# Patient Record
Sex: Male | Born: 1940
Health system: Southern US, Community
[De-identification: ages and names within clinical notes are randomized; demographics above are authoritative.]

## PROBLEM LIST (undated history)

## (undated) DIAGNOSIS — H409 Unspecified glaucoma: Secondary | ICD-10-CM

## (undated) DIAGNOSIS — E785 Hyperlipidemia, unspecified: Secondary | ICD-10-CM

## (undated) DIAGNOSIS — I7090 Unspecified atherosclerosis: Secondary | ICD-10-CM

## (undated) DIAGNOSIS — E119 Type 2 diabetes mellitus without complications: Secondary | ICD-10-CM

## (undated) DIAGNOSIS — I252 Old myocardial infarction: Secondary | ICD-10-CM

## (undated) DIAGNOSIS — I2510 Atherosclerotic heart disease of native coronary artery without angina pectoris: Secondary | ICD-10-CM

## (undated) DIAGNOSIS — I10 Essential (primary) hypertension: Secondary | ICD-10-CM

## (undated) DIAGNOSIS — I739 Peripheral vascular disease, unspecified: Secondary | ICD-10-CM

## (undated) HISTORY — DX: Essential (primary) hypertension: I10

## (undated) HISTORY — DX: Type 2 diabetes mellitus without complications: E11.9

## (undated) HISTORY — DX: Peripheral vascular disease, unspecified: I73.9

## (undated) HISTORY — DX: Hyperlipidemia, unspecified: E78.5

## (undated) HISTORY — DX: Unspecified atherosclerosis: I70.90

## (undated) HISTORY — DX: Atherosclerotic heart disease of native coronary artery without angina pectoris: I25.10

---

## 2003-01-30 ENCOUNTER — Inpatient Hospital Stay (HOSPITAL_COMMUNITY): Admission: EM | Admit: 2003-01-30 | Discharge: 2003-02-08 | Payer: Self-pay | Admitting: Emergency Medicine

## 2003-02-22 HISTORY — PX: CORONARY ARTERY BYPASS GRAFT: SHX141

## 2004-04-01 ENCOUNTER — Ambulatory Visit: Payer: Self-pay | Admitting: Family Medicine

## 2004-06-21 ENCOUNTER — Ambulatory Visit: Payer: Self-pay | Admitting: Family Medicine

## 2005-01-07 ENCOUNTER — Ambulatory Visit: Payer: Self-pay | Admitting: Family Medicine

## 2005-06-14 ENCOUNTER — Ambulatory Visit: Payer: Self-pay | Admitting: Family Medicine

## 2005-11-14 ENCOUNTER — Ambulatory Visit: Payer: Self-pay | Admitting: Family Medicine

## 2006-02-27 ENCOUNTER — Ambulatory Visit: Payer: Self-pay | Admitting: Family Medicine

## 2010-01-18 ENCOUNTER — Encounter: Payer: Self-pay | Admitting: Endocrinology

## 2010-01-18 LAB — CONVERTED CEMR LAB: Hgb A1c MFr Bld: 11.9 %

## 2010-02-10 ENCOUNTER — Encounter: Payer: Self-pay | Admitting: Endocrinology

## 2010-02-10 DIAGNOSIS — E785 Hyperlipidemia, unspecified: Secondary | ICD-10-CM | POA: Insufficient documentation

## 2010-02-10 DIAGNOSIS — E1169 Type 2 diabetes mellitus with other specified complication: Secondary | ICD-10-CM | POA: Insufficient documentation

## 2010-02-10 DIAGNOSIS — I7090 Unspecified atherosclerosis: Secondary | ICD-10-CM | POA: Insufficient documentation

## 2010-02-10 DIAGNOSIS — E119 Type 2 diabetes mellitus without complications: Secondary | ICD-10-CM

## 2010-02-10 DIAGNOSIS — I739 Peripheral vascular disease, unspecified: Secondary | ICD-10-CM

## 2010-02-10 DIAGNOSIS — I10 Essential (primary) hypertension: Secondary | ICD-10-CM

## 2010-02-10 DIAGNOSIS — I152 Hypertension secondary to endocrine disorders: Secondary | ICD-10-CM | POA: Insufficient documentation

## 2010-02-10 HISTORY — DX: Unspecified atherosclerosis: I70.90

## 2010-02-10 HISTORY — DX: Hyperlipidemia, unspecified: E78.5

## 2010-02-10 HISTORY — DX: Type 2 diabetes mellitus without complications: E11.9

## 2010-02-10 HISTORY — DX: Peripheral vascular disease, unspecified: I73.9

## 2010-02-10 HISTORY — DX: Essential (primary) hypertension: I10

## 2010-02-25 ENCOUNTER — Ambulatory Visit
Admission: RE | Admit: 2010-02-25 | Discharge: 2010-02-25 | Payer: Self-pay | Source: Home / Self Care | Attending: Endocrinology | Admitting: Endocrinology

## 2010-02-25 DIAGNOSIS — I2510 Atherosclerotic heart disease of native coronary artery without angina pectoris: Secondary | ICD-10-CM | POA: Insufficient documentation

## 2010-02-25 HISTORY — DX: Atherosclerotic heart disease of native coronary artery without angina pectoris: I25.10

## 2010-03-11 ENCOUNTER — Ambulatory Visit
Admission: RE | Admit: 2010-03-11 | Discharge: 2010-03-11 | Payer: Self-pay | Source: Home / Self Care | Attending: Endocrinology | Admitting: Endocrinology

## 2010-03-11 LAB — CONVERTED CEMR LAB: Blood Glucose, Fingerstick: 200
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
## 2010-03-25 NOTE — Assessment & Plan Note (Signed)
Summary: 2 WK FU---STC   Vital Signs:  Patient profile:   70 year old male Height:      70 inches (177.80 cm) Weight:      181 pounds (82.27 kg) BMI:     26.06 O2 Sat:      97 % on Room air Temp:     97.5 degrees F (36.39 degrees C) oral Pulse rate:   57 / minute Pulse rhythm:   regular BP sitting:   134 / 82  (left arm) Cuff size:   regular  Vitals Entered By: Brenton Grills CMA Duncan Dull) (March 11, 2010 9:14 AM)  O2 Flow:  Room air CC: 2 week F/U/aj Is Patient Diabetic? Yes CBG Result 200   Referring Provider:  Ernestine Conrad MD Primary Provider:  Ernestine Conrad MD  CC:  2 week F/U/aj.  History of Present Illness: pt states he feels well in general.  he brings a record of his cbg's which i have reviewed today.  it was 79 this am.  he is not sure why it was lower today.  otherwise, it varies from 180-210, with no trend throughout the day.    Current Medications (verified): 1)  Lisinopril 20 Mg Tabs (Lisinopril) .Marland Kitchen.. 1 Tablet By Mouth Once Daily 2)  Metoprolol Tartrate 25 Mg Tabs (Metoprolol Tartrate) .Marland Kitchen.. 1 Tablet By Mouth Every Morning 3)  Ambien 10 Mg Tabs (Zolpidem Tartrate) .Marland Kitchen.. 1 Tablet By Mouth At Bedtime 4)  Amlodipine Besylate 5 Mg Tabs (Amlodipine Besylate) .Marland Kitchen.. 1 Tablet By Mouth Once Daily 5)  Humulin N 100 Unit/ml Susp (Insulin Isophane Human) .... 40 Units Each Am, and Syringes Once Daily  Allergies (verified): No Known Drug Allergies  Past History:  Past Medical History: Last updated: 02/10/2010 Hypertension Hyperlipidemia NOS Diabetes Type 2  Review of Systems  The patient denies hypoglycemia.    Physical Exam  General:  normal appearance.   Skin:  injection sites at the anteriod abdomen are without lesions.     Impression & Recommendations:  Problem # 1:  DIABETES TYPE 2 (ICD-250.00) needs increased rx  Medications Added to Medication List This Visit: 1)  Humulin N 100 Unit/ml Susp (Insulin isophane human) .... 45 units each am, and syringes  once daily  Other Orders: Glucose, (CBG) (16109) Est. Patient Level III ([address])  Patient Instructions: 1)  check your blood sugar 2 times a day.  vary the time of day when you check, between before the 3 meals, and at bedtime.  also check if you have symptoms of your blood sugar being too high or too low.  please keep a record of the readings and bring it to your next appointment here.  please call us sooner if you are having low blood sugar episodes. 2)  for now, increase humulin nph insulin to 45 units each am. 3)  Please schedule a follow-up appointment in 1 month. Prescriptions: HUMULIN N 100 UNIT/ML SUSP (INSULIN ISOPHANE HUMAN) 45 units each am, and syringes once daily  #2 vials x 11   Entered and Authorized by:   Minus Breeding MD   Signed by:   Minus Breeding MD on 03/11/2010   Method used:   Electronically to        [facility]  [address] (retail)       [address]       [place], [place]  [address]       Ph: [phone]  Fax: [phone]   RxID:   [id number]    Orders Added: 1)  Glucose, (CBG) [82962] 2)  Est. Patient Level III [53664]    Laboratory Results   Blood Tests     CBG Random:: 200mg /dL

## 2010-03-25 NOTE — Letter (Signed)
Summary: [facility]   Imported By: Sherian Rein 03/04/2010 12:55:22  _____________________________________________________________________  External Attachment:    Type:   Image     Comment:   External Document

## 2010-04-15 ENCOUNTER — Ambulatory Visit: Payer: Self-pay | Admitting: Endocrinology

## 2010-04-19 ENCOUNTER — Other Ambulatory Visit: Payer: Medicare Other

## 2010-04-19 ENCOUNTER — Other Ambulatory Visit: Payer: Self-pay | Admitting: Endocrinology

## 2010-04-19 ENCOUNTER — Ambulatory Visit (INDEPENDENT_AMBULATORY_CARE_PROVIDER_SITE_OTHER): Payer: Medicare Other | Admitting: Endocrinology

## 2010-04-19 ENCOUNTER — Encounter: Payer: Self-pay | Admitting: Endocrinology

## 2010-04-19 DIAGNOSIS — E119 Type 2 diabetes mellitus without complications: Secondary | ICD-10-CM

## 2010-04-19 LAB — HEMOGLOBIN A1C: Hgb A1c MFr Bld: 11.4 % — ABNORMAL HIGH (ref 4.6–6.5)

## 2010-04-19 LAB — CONVERTED CEMR LAB: Blood Glucose, Fingerstick: 286

## 2010-04-29 NOTE — Assessment & Plan Note (Signed)
Summary: 1 mth fu stc   Vital Signs:  Patient profile:   70 year old male Height:      70 inches Weight:      184.25 pounds BMI:     26.53 O2 Sat:      97 % on Room air Temp:     98.4 degrees F oral Pulse rate:   58 / minute BP sitting:   140 / 82  (left arm) Cuff size:   regular  Vitals Entered By: Margaret Pyle, CMA (April 19, 2010 2:16 PM)  O2 Flow:  Room air CC: 1 mth follow up/DBD Is Patient Diabetic? Yes CBG Result 286   Referring Provider:  Ernestine Conrad MD Primary Provider:  Ernestine Conrad MD  CC:  1 mth follow up/DBD.  History of Present Illness: he does not feel well, due to several dental extractions last week.  no cbg record, but states cbg's vary from 179-400, with no trend throughout the day.    Allergies: No Known Drug Allergies  Past History:  Past Medical History: Last updated: 02/10/2010 Hypertension Hyperlipidemia NOS Diabetes Type 2  Review of Systems  The patient denies hypoglycemia.    Physical Exam  General:  normal appearance.   Psych:  Alert and cooperative; normal mood and affect; normal attention span and concentration.   Additional Exam:  Hemoglobin A1C       [H]  11.4 %    Impression & Recommendations:  Problem # 1:  DIABETES TYPE 2 (ICD-250.00) therapy is severely limited by noncompliance, and by cost factors.  i'll do the best i can.  Medications Added to Medication List This Visit: 1)  Humulin N 100 Unit/ml Susp (Insulin isophane human) .... 50 units each am, and syringes once daily  Other Orders: TLB-A1C / Hgb A1C (Glycohemoglobin) (83036-A1C) Est. Patient Level III (95621)  Patient Instructions: 1)  check your blood sugar 2 times a day.  vary the time of day when you check, between before the 3 meals, and at bedtime.  also check if you have symptoms of your blood sugar being too high or too low.  please keep a record of the readings and bring it to your next appointment here.  please call us sooner if you are  having low blood sugar episodes. 2)  blood tests are being ordered for you today.  please call [phone] to hear your test results. 3)  pending the test results, please increase humulin nph insulin to 50 units each am. 4)  Please schedule a follow-up appointment in 1 month. 5)  (update: i left message on phone-tree:  increase insulin to 60 units each am)   Orders Added: 1)  TLB-A1C / Hgb A1C (Glycohemoglobin) [83036-A1C] 2)  Est. Patient Level III [46962]

## 2010-05-19 ENCOUNTER — Ambulatory Visit: Payer: Medicare Other | Admitting: Endocrinology

## 2010-05-19 ENCOUNTER — Encounter: Payer: Self-pay | Admitting: Endocrinology

## 2010-06-08 ENCOUNTER — Ambulatory Visit (INDEPENDENT_AMBULATORY_CARE_PROVIDER_SITE_OTHER): Payer: Medicare Other | Admitting: Endocrinology

## 2010-06-08 ENCOUNTER — Encounter: Payer: Self-pay | Admitting: Endocrinology

## 2010-06-08 VITALS — BP 132/70 | HR 56 | Temp 98.0°F | Ht 70.0 in | Wt 190.8 lb

## 2010-06-08 DIAGNOSIS — E119 Type 2 diabetes mellitus without complications: Secondary | ICD-10-CM

## 2010-06-08 MED ORDER — INSULIN NPH (HUMAN) (ISOPHANE) 100 UNIT/ML ~~LOC~~ SUSP: 70.0000 [IU] | SUBCUTANEOUS | Status: DC

## 2010-06-08 NOTE — Patient Instructions (Addendum)
increase nph insulin to 70 units each morning. Please make a follow-up appointment in 2 weeks. check your blood sugar 2 times a day.  vary the time of day when you check, between before the 3 meals, and at bedtime.  also check if you have symptoms of your blood sugar being too high or too low.  please keep a record of the readings and bring it to your next appointment here.  please call us sooner if you are having low blood sugar episodes.

## 2010-06-08 NOTE — Progress Notes (Signed)
  Subjective:    Patient ID: Steven Osborne, male    DOB: 1940-03-08, 70 y.o.   MRN: [id number]  HPI pt states he feels well in general, except for weight gain.  He has  Not yet increased his insulin to 60 unit qam, and he is using-up his 50-unit syringes.  he brings a record of his cbg's which i have reviewed today.  It varies form 120-300.  It is in general higher as the day goes on.   Past Medical History  Diagnosis Date  . DIABETES TYPE 2 02/10/2010  . HYPERLIPIDEMIA NOS 02/10/2010  . HYPERTENSION 02/10/2010  . CAD 02/25/2010  . ARTERIOSCLEROSIS 02/10/2010  . INTERMITTENT CLAUDICATION 02/10/2010   Past Surgical History  Procedure Date  . Coronary artery bypass graft 2005    4 vessel bypass     reports that he has quit smoking. His smokeless tobacco use includes Chew. He reports that he does not drink alcohol or use illicit drugs. family history includes Diabetes in his brother, father, mother, and sister; Heart disease in his father and mother; Hypertension in his other; and Stroke in his father. No Known Allergies   Review of Systems denies hypoglycemia    Objective:   Physical Exam GENERAL: no distress Pulses: dorsalis pedis intact bilat.   Feet: no deformity.  no ulcer on the feet.  feet are of normal color and temp.  no edema Neuro: sensation is intact to touch on the feet.        Assessment & Plan:  Dm, needs increased rx

## 2010-06-22 ENCOUNTER — Encounter: Payer: Self-pay | Admitting: Endocrinology

## 2010-06-22 ENCOUNTER — Ambulatory Visit (INDEPENDENT_AMBULATORY_CARE_PROVIDER_SITE_OTHER): Payer: Medicare Other | Admitting: Endocrinology

## 2010-06-22 DIAGNOSIS — E119 Type 2 diabetes mellitus without complications: Secondary | ICD-10-CM

## 2010-06-22 MED ORDER — INSULIN NPH (HUMAN) (ISOPHANE) 100 UNIT/ML ~~LOC~~ SUSP: SUBCUTANEOUS | Status: DC

## 2010-06-22 NOTE — Progress Notes (Signed)
  Subjective:    Patient ID: Steven Osborne, male    DOB: 1940/12/19, 70 y.o.   MRN: [id number]  HPI pt states he feels well in general.  he brings a record of his cbg's which i have reviewed today.  It vries from 120-300.  It is higher in am than in the afternoon.  He reports weight gain.   Past Medical History  Diagnosis Date  . DIABETES TYPE 2 02/10/2010  . HYPERLIPIDEMIA NOS 02/10/2010  . HYPERTENSION 02/10/2010  . CAD 02/25/2010  . ARTERIOSCLEROSIS 02/10/2010  . INTERMITTENT CLAUDICATION 02/10/2010   Past Surgical History  Procedure Date  . Coronary artery bypass graft 2005    4 vessel bypass     reports that he has quit smoking. His smokeless tobacco use includes Chew. He reports that he does not drink alcohol or use illicit drugs. family history includes Diabetes in his brother, father, mother, and sister; Heart disease in his father and mother; Hypertension in his other; and Stroke in his father. No Known Allergies   Review of Systems denies hypoglycemia    Objective:   Physical Exam GENERAL: no distress Alert and oriented x 3.  Does not appear anxious nor depressed.        Assessment & Plan:  Dm, therapy limited by pt's need for a simple regimen.  He needs increased rx

## 2010-06-22 NOTE — Patient Instructions (Addendum)
increase nph insulin to 60 units each morning, and 20 with the evening meal. Please make a follow-up appointment in 1 month. check your blood sugar 2 times a day.  vary the time of day when you check, between before the 3 meals, and at bedtime.  also check if you have symptoms of your blood sugar being too high or too low.  please keep a record of the readings and bring it to your next appointment here.  please call us sooner if you are having low blood sugar episodes.

## 2010-07-09 NOTE — Cardiovascular Report (Signed)
NAME:  Steven Osborne, Steven Osborne NO.:  [id number]   MEDICAL RECORD NO.:  [id number]                   PATIENT TYPE:  INP   LOCATION:  3705                                 FACILITY:  [facility]   PHYSICIAN:  Rollene Rotunda, M.D.                DATE OF BIRTH:  12-28-1940   DATE OF PROCEDURE:  01/31/2003  DATE OF DISCHARGE:                              CARDIAC CATHETERIZATION   PRIMARY CARE PHYSICIAN:  Colon Flattery, M.D.   PROCEDURES PERFORMED:  1. Left heart catheterization.  2. Coronary arteriography.   CARDIOLOGIST:  Rollene Rotunda, M.D.   INDICATIONS:  Evaluate patient with unstable angina.   PROCEDURAL NOTE:  Left heart catheterization was performed via the right  femoral artery.  The artery was cannulated using anterior wall puncture.  A  #6 French arterial sheath was inserted via the modified Seldinger technique.  Preformed Judkins and pigtail catheter were utilized.   The patient tolerated the procedure well  and left the lab in stable  condition.   RESULTS:   HEMODYNAMIC DATA:  LV 141/16.  Aortic output 139/83.   ANGIOGRAPHIC DATA:  Coronaries:  Left Main Coronary Artery:  The left main was normal.   Left Anterior Descending:  The LAD had a mid 80% long stenosis after the  first diagonal.  The remainder of the vessel was free of high-grade disease.  It did not wrap the apex.  The diagonal is large with ostial 95% stenosis.   Circumflex Artery:  The circumflex in the AV groove had luminal  irregularities.  The vessel essentially terminated as a second mid obtuse  marginal.  The first obtuse marginal is very long supplying a large  territory with an ostial 95% stenosis.  The second obtuse marginal was very  large and branching with luminal irregularities.   Right Coronary Artery:  The right coronary artery is dominant.  There was  mid 30-40% stenosis.  There were diffuse luminal irregularities.   VENTRICULOGRAPHIC DATA:  Left Ventriculogram:  The  left ventriculogram was  obtained in the RAO projection.  The EF was approximately 55% with mild  anterior hypokinesis (the LV was under filled and so it was difficult to  completely assess wall motion).   CONCLUSION:  High-grade diagonal, left anterior descending and marginal  stenoses.   PLAN:  The patient will be referred for CABG.                                               Rollene Rotunda, M.D.    JH/MEDQ  D:  01/31/2003  T:  02/02/2003  Job:  [id number]   cc:   Colon Flattery, MD  [address]  [place]  [place] [address]  Fax: [phone]

## 2010-07-09 NOTE — H&P (Signed)
NAME:  Steven Osborne, Steven Osborne NO.:  [id number]   MEDICAL RECORD NO.:  [id number]                   PATIENT TYPE:  EMS   LOCATION:  [facility]                                 FACILITY:  [facility]   PHYSICIAN:  Steven Osborne, M.D.               DATE OF BIRTH:  12-25-1940   DATE OF ADMISSION:  01/30/2003  DATE OF DISCHARGE:                                HISTORY & PHYSICAL   [name] is a 70 year old gentleman who is referred to the emergency  room because of chest pain.   His past medical history is notable for hypertension, cigarette use,  diabetes with a hemoglobin A1C of greater than nine, and  hypercholesterolemia with an LDL of greater than 160, who had an episode of  chest pain about two years ago that persisted for about two weeks and was  felt to be related to musculoskeletal or injury.   He has had no recurrent chest pain until about two or three weeks ago when  he started developing intermittent chest pain which he describes as a  burning that radiated to his neck and to his arms bilaterally.  It was  unassociated with diaphoresis, nausea, or shortness of breath.  It was not  necessarily associated with exertion and typically would last five to ten  minutes and when it started he actually had to walk around because he was so  uncomfortable.  Because of this discomfort he presented to Dr. Karmen Osborne  office who referred him for further evaluation.   His electrocardiogram up there was notable for minimal ST elevation in the  inferolateral leads with inverted T wave in lead aVL.   He is currently pain free.   PAST MEDICAL HISTORY:  His past medical history is notable for the risk  factors noted above.   REVIEW OF SYSTEMS:  Notable for chronic bronchitis but is negative for  general, skin, neurological, musculoskeletal, endocrine, and GU.  The GI  history is notable for a remote history of peptic ulcer disease.   MEDICATIONS:  He takes a blood  pressure pill and a sugar pill but does not  know the names.   ALLERGIES:  No known drug allergies.   SOCIAL HISTORY:  He lives by himself, he has one daughter.  He smokes, he  does not drink and does not use recreational drugs.   PHYSICAL EXAMINATION:  GENERAL:  He is an elderly Caucasian male who looks a  little older than his stated age.  VITAL SIGNS:  Blood pressure is 134/74, pulse is 86.  HEENT:  Exam demonstrated no icterus nor xanthoma.  NECK:  The neck veins were seven to eight cm and his carotids were brisk and  full bilaterally without bruits.  BACK:  Was without kyphosis, scoliosis.  LUNGS:  Clear.  HEART:  Regular heart sounds without murmurs or gallops.  S1 was somewhat  diminished.  ABDOMEN:  Soft with active bowel sounds without midline pulsation or  hepatomegaly.  EXTREMITIES:  Femoral pulses were 2+, distal pulses were intact.  There was no clubbing,  cyanosis or edema.  NEUROLOGIC:  Exam was grossly normal.  SKIN:  Warm and dry.   Electrocardiogram dated tonight demonstrated sinus rhythm at 72 with  intervals at 0.16/0.10/0.37.  There is minimal ST segment elevation about  1/2 to one cm in leads II, II and F, and an inverted T wave in lead aVL with  a Q wave that were nondiagnostic in I and L.   IMPRESSION:  1. Chest pain with typical and atypical features with negative enzymes.  2. Cardiac risk factors include:     a. Hypertension.     b. Cigarettes.     c. Diabetes.     d. Hypercholesterolemia.  3. History of peptic ulcer disease.   DISCUSSION:  Steven Osborne has chest pain with typical and atypical features in  the context of multiple cardiac risk factors.  His pre-test probability of  coronary artery disease is so high that I think a post-test probability of a  negative Cardiolite is actually not likely to be helpful.  For that reason will admit him, put him on heparin, add low dose beta  blockade and plan to catheterize him in the morning.                                                 Steven Osborne, M.D.    SCK/MEDQ  D:  01/30/2003  T:  01/31/2003  Job:  [id number]   cc:   Steven Flattery, MD  [address]  [place]  [place] [address]  Fax: [phone]

## 2010-07-09 NOTE — Discharge Summary (Signed)
NAMEMarland Kitchen  Steven, Osborne NO.:  [id number]   MEDICAL RECORD NO.:  [id number]                   PATIENT TYPE:  INP   LOCATION:  2033                                 FACILITY:  [facility]   PHYSICIAN:  [name], M.D.               DATE OF BIRTH:  August 16, 1940   DATE OF ADMISSION:  01/30/2003  DATE OF DISCHARGE:  02/08/2003                                 DISCHARGE SUMMARY   ADMISSION DIAGNOSIS:  Chest pain with typical and atypical features.   PAST MEDICAL HISTORY:  1. Diabetes mellitus type 2. Admitting hemoglobin A1C greater than 9.0.  2. Hypertension.  3. Hypercholesterolemia with LDL greater than 160.  4. Continued cigarette use.   ALLERGIES:  No known drug allergies.   DISCHARGE DIAGNOSIS:  Severe three-vessel coronary artery disease with class  I stable angina.  Status post CABG.   BRIEF HISTORY:  Steven Osborne is a 70 year old Caucasian male.  He presented to  the emergency department at [facility] on December 9th because of  chest pain.  He had presented to the office of his primary care doctor, Dr.  [name], earlier in the day with the same complaints, and he was referred for  further evaluation.  On arrival, he was evaluated by Dr. [name] of  [facility] cardiology service.  His impression was that his typical  and atypical chest pain features in the context of multiple cardiac risk  factors represented a high probability of coronary disease.  His  recommendation was for admission to the hospital.  Treatment with heparin,  low dose beta blockade, and cardiac catheterization.  Steven Osborne was  agreeable to this plan.   HOSPITAL COURSE:  On January 30, 2003, Steven Osborne was admitted to [facility] under the care of [facility] cardiology service.  On  December 10th, he underwent a cardiac catheterization.  This demonstrated  three-vessel coronary disease with fairly well preserved LV function.  This  is  estimated to be 55%.  As his lesions were not amenable to PCI, cardiac  surgery consultation was requested.  He was evaluated later in the day by  Dr. [name].  After examination of the patient and review of the  available records, Dr. [name] agreed that coronary artery bypass graft  was the preferred treatment choice for this gentleman.  The procedure, risks  and benefits were all discussed with Steven Osborne, and he agreed to proceed  with surgery.  He was scheduled for Monday, December 13th.  Because of his  unstable angina, he remained in the hospital until surgery.   Preoperative arterial evaluation included a carotid duplex scan, which  revealed no significant carotid artery disease.  His lower extremity  evaluation included ankle brachial indices, which were greater than 1.0  bilaterally.  Steven Osborne remained stable in the hospital while awaiting his  surgery.   On February 03, 2003, Steven Osborne underwent the following surgical procedure  with Dr. [name]:  Coronary artery bypass grafting x4.  Grafts  placed at the time of procedure:  Left internal mammary artery graft to the  left anterior descending artery, saphenous vein graft to the diagonal  artery, saphenous vein graft to the circumflex artery, saphenous vein graft  to the right coronary artery.  A vein was harvested from the right thigh  with the endovein harvesting technique through the mid calf via an open  surgical technique.  He tolerated this procedure well, transferred in stable  condition to the SICU.  He remained hemodynamically stable in the immediate  postoperative and was extubated several hours after arrival to the intensive  care unit.  He required only routine care while in the intensive care unit  and was transferred to unit 2000 on postoperative day #2.   Steven Osborne has continued to make very good progress in recovering from his  surgery.  He began working with cardiac rehab services while  still in the  intensive care unit.  He has been ambulating daily.  His distance has  increased daily.   Today, November 08, 2002, which is postoperative day #4, Steven Osborne reports  feeling well.  His vital signs are stable with blood pressure at 126/74.  He  is afebrile.  His room air saturation is 96%.  He is at his preoperative  weight of 185.2 pounds.  His heart is maintaining a normal sinus rhythm at  84 beats per minute, and his lungs are with slightly decreased breath  sounds.  No wheezes or crackles.  He has no GI or GU complaints.  He is  tolerating his diet well.  His bowel and bladder functions are within normal  limits for him.  His incisions are all healing well.  He ambulated with  cardiac rehab 530 feet with minimal assistance this morning.  His pain  control is adequate.  His CBGs have been running fairly high, and  yesterday's range was 164 to 214.  He was started on Glucotrol 10 mg b.i.d.  His home medication of Avandia 4 mg will be restarted today.  He will need  followup with his primary care Steven Osborne within the week after discharge.  If  Steven Osborne continues to progress in this manner, it is anticipated that he  will be ready for discharge home tomorrow, February 08, 2003.  He has been  counseled regarding smoking cessation.  Steven Osborne has also been followed by  case management throughout his stay.  He does not have insurance; therefore,  attempts are being made to reduce his medication costs.  [facility]  is working on providing some samples of his medication.  Case management  will also try to assist any way they can.   RECENT LABORATORY STUDIES:  On December 16th, CBC:  White blood cells 6.3,  hemoglobin 10.7, hematocrit 30.4, platelets 111.  Chemistries included a  sodium of 139, potassium 4.2, BUN 9, creatinine 0.9, glucose 129.   CONDITION ON DISCHARGE:  Improved.   DISCHARGE INSTRUCTIONS: 1. Medications:  He is to resume his Avandia 4 mg daily.   2. New medications:  Glipizide 10 mg b.i.d., aspirin 325 mg q.d., Mavik 2 mg     q.d., Toprol XL 25 mg q.d., Lipitor 40 mg daily.  3. For pain management, he may have Tylox 1-2 p.o. q.4-6h. p.r.n. for     moderate-to-severe pain  or Tylenol 325 mg 1-2 p.o. q.4-6h. for mild pain.  4. Activity:  He is asked to refrain from any driving, heavy lifting,     pushing or pulling.  5. Also instructed to continue his breathing exercises and daily walking.  6. Diet:  Heart healthy with limited carbohydrates.  7. Wound care:  He is to shower daily.  If the incision shows any signs of     infection or if he has a fever greater than 101, he is to return or is to     call Dr. Zenaida Niece Osborne's office.   FOLLOW UP:  1. Dr. [name]:  He has been advised to make an appointment to see Dr. [name]     next week regarding his diabetes.  2. Dr. Graciela Husbands would like to see him in his office in approximately two weeks.     An appointment will be made for him prior to discharge.  3. Dr. [name] would like to see him in the CVTS office on Friday, January     7th at 12:45.      Toribio Harbour, N.P.                  [name], M.D.    CTK/MEDQ  D:  02/07/2003  T:  02/09/2003  Job:  [id number]   cc:   Colon Flattery, MD  [address]  [place]  [place] [address]  Fax: [phone]   Duke Salvia, M.D.

## 2010-07-09 NOTE — Op Note (Signed)
NAME:  Steven Osborne, Steven Osborne NO.:  [id number]   MEDICAL RECORD NO.:  [id number]                   PATIENT TYPE:  INP   LOCATION:  2303                                 FACILITY:  [facility]   PHYSICIAN:  [name] III, M.D.           DATE OF BIRTH:  1940-04-12   DATE OF PROCEDURE:  02/03/2003  DATE OF DISCHARGE:                                 OPERATIVE REPORT   PREOPERATIVE DIAGNOSES:  1. Class IV unstable angina with severe three-vessel disease.  2. Diabetes.   POSTOPERATIVE DIAGNOSES:  1. Class IV unstable angina with severe three-vessel disease.  2. Diabetes.   OPERATION:  Coronary artery bypass grafting x4 (left internal mammary artery  to left anterior descending artery, saphenous vein graft to diagonal,  saphenous vein graft to obtuse marginal 1, saphenous vein graft to right  coronary artery).   SURGEON:  [name], M.D.   ASSISTANT:  Toribio Harbour, N.P.   ANESTHESIA:  General by Sheldon Silvan, M.D.   INDICATIONS:  The patient is a 70 year old male diabetic smoker who presents  with unstable angina.  Cardiac catheterization by Rollene Rotunda, M.D.,  demonstrated severe three-vessel coronary disease with fairly well-preserved  LV function.  He was felt to be a candidate for surgical revascularization.  Prior to surgery I discussed the indications, expected benefits, and  alternatives to surgery for treatment of his coronary disease.  I discussed  the major aspects of the procedure with the patient and reviewed the choice  of conduit for grafts, the location of the incisions, the use of general  anesthesia and cardiopulmonary bypass, and the expected postoperative  hospital recovery period.  I discussed with the patient the risks to him of  coronary bypass surgery including the risks of MI, CVA, bleeding, blood  transfusion requirement, infection, and death.  He understood these  implications for the surgery and agreed to proceed with  the operation as  planned under what I felt was an informed consent.   OPERATIVE FINDINGS:  Saphenous vein was harvested from the right leg using  endoscopic technique.  The mammary artery was a good vessel with excellent  flow.  The LAD was a small vessel on the anterior wall, and the dominant  anterior vessel was the diagonal.  There was some mild scarring around the  OM-1 vessel from an old MI.   PROCEDURE:  The patient was brought to the operating room and placed supine  on the operating room table, where general anesthesia was induced under  invasive hemodynamic monitoring.  The chest, abdomen, and legs were prepped  with Betadine and draped as a sterile field.  A sternal incision was made as  the saphenous vein was harvested from the thigh.  The internal mammary  artery was harvested as a pedicle graft from its origin at the subclavian  vessels.  Heparin was administered, and the ACT was documented  as being  therapeutic.  The pericardium was opened and a pericardial cradle was  suspended.  Through pursestrings in the ascending aorta and right atrium,  the patient was cannulated and placed on bypass and cooled to 32 degrees.  The coronaries were identified for grafting.  The mammary artery and vein  grafts were prepared for the distal anastomoses.  A cardioplegia cannula was  placed in the ascending aorta.  The patient was cooled to 30 degrees.  As  the aortic crossclamp was applied, 650 mL of cold blood cardioplegia was  delivered to the aortic root with good cardioplegic arrest and septal  temperature dropping to less than 12 degrees.  Topical iced saline slush was  used to augment myocardial preservation and a pericardial insulator pad was  used to protect the left phrenic nerve.   The distal coronary anastomoses were then performed.  The first distal  anastomosis was to the diagonal.  This was a 1.7 mm vessel with a proximal  95% stenosis.  A reverse saphenous vein was sewn  end-to-side with running 7-  0 Prolene with good flow through the graft.  The second distal anastomosis  was to the OM-1.  This was a smaller 1.4 mm vessel with proximal 95%  stenosis.  A reversed saphenous vein was sewn end-to-side with running 7-0  Prolene with good flow through the graft.  Cardioplegia was redosed.  The  third distal anastomosis was to the distal right.  This was a sclerotic,  diffusely diseased, diabetic-type vessel with a proximal 70% stenosis.  A  reversed saphenous vein was sewn end-to-side with running 7-0 Prolene.  There was good flow through the graft.  The fourth distal anastomosis was to  the mid-LAD.  Distally the LAD would be too small to graft.  The left  internal mammary artery pedicle was brought through an opening created in  the left lateral pericardium and was brought down onto the LAD and sewn end-  to-side with running 8-0 Prolene.  There was excellent flow through the  anastomosis with immediate rise in septal temperature after release of the  pedicle clamp on the mammary artery.  The mammary pedicle was secured to the  epicardium and the aortic crossclamp was removed.   The heart was cardioverted back to a regular rhythm.  Three proximal vein  anastomoses were placed on the ascending aorta using a partial occluding  clamp.  The partial clamp was removed and the vein grafts were perfused.  All the bypasses had good flow and hemostasis was documented at proximal and  distal sites.  The patient was rewarmed to 37 degrees and temporary pacing  wires were applied.  The lungs were re-expanded and the ventilator was  resumed.  The patient was weaned from bypass without difficulty.  Cardiac  output and blood pressure were stable.  Protamine was administered without  adverse reaction.  The cannulas were removed. The mediastinum was irrigated with warm antibiotic irrigation.  The leg incision was irrigated, closed in  a standard fashion.  The superior  pericardial fat was closed over the aorta  and vein grafts.  Two mediastinal and a left pleural chest tube were placed  and brought out through separate incisions.  The sternum was closed with  interrupted steel wire.  The pectoralis fascia was closed using a running #1  Vicryl.  Both the subcutaneous and skin were closed with a running Vicryl  and sterile dressings were applied.  Total bypass time was 110 minutes  with  a crossclamp time of 55 minutes.                                               Mikey Bussing, M.D.    PV/MEDQ  D:  02/03/2003  T:  02/04/2003  Job:  [id number]   cc:   [facility]

## 2010-07-23 ENCOUNTER — Encounter: Payer: Self-pay | Admitting: Endocrinology

## 2010-07-23 ENCOUNTER — Other Ambulatory Visit (INDEPENDENT_AMBULATORY_CARE_PROVIDER_SITE_OTHER): Payer: Medicare Other

## 2010-07-23 ENCOUNTER — Ambulatory Visit (INDEPENDENT_AMBULATORY_CARE_PROVIDER_SITE_OTHER): Payer: Medicare Other | Admitting: Endocrinology

## 2010-07-23 VITALS — BP 138/80 | HR 54 | Temp 98.3°F | Ht 70.0 in | Wt 198.4 lb

## 2010-07-23 DIAGNOSIS — E119 Type 2 diabetes mellitus without complications: Secondary | ICD-10-CM

## 2010-07-23 LAB — HEMOGLOBIN A1C: Hgb A1c MFr Bld: 10.2 % — ABNORMAL HIGH (ref 4.6–6.5)

## 2010-07-23 NOTE — Progress Notes (Signed)
  Subjective:    Patient ID: Steven Osborne, male    DOB: 1940/03/04, 70 y.o.   MRN: [id number]  HPI Pt says cbg's have improved.  pt states he feels well in general.   he brings a record of his cbg's which i have reviewed today.  It was low twice--once in am (64), and once in the afternoon (74).  It is is high as 300, but most are in the 100's, with no trend throughout the day.  pt states he feels well in general, except for weight gain.   Past Medical History  Diagnosis Date  . DIABETES TYPE 2 02/10/2010  . HYPERLIPIDEMIA NOS 02/10/2010  . HYPERTENSION 02/10/2010  . CAD 02/25/2010  . ARTERIOSCLEROSIS 02/10/2010  . INTERMITTENT CLAUDICATION 02/10/2010    Past Surgical History  Procedure Date  . Coronary artery bypass graft 2005    4 vessel bypass     History   Social History  . Marital Status: Divorced    Spouse Name: N/A    Number of Children: N/A  . Years of Education: N/A   Occupational History  . Not on file.   Social History Main Topics  . Smoking status: Former Games developer  . Smokeless tobacco: Current User    Types: Chew  . Alcohol Use: No  . Drug Use: No  . Sexually Active: Not on file   Other Topics Concern  . Not on file   Social History Narrative   Divorced, lives aloneRegular exercise-yes    Current Outpatient Prescriptions on File Prior to Visit  Medication Sig Dispense Refill  . amLODipine (NORVASC) 5 MG tablet Take 5 mg by mouth daily.        . insulin NPH (HUMULIN N) 100 UNIT/ML injection 60 unit each morning, and 20 in the evening  30 mL  12  . lisinopril (PRINIVIL,ZESTRIL) 20 MG tablet Take 20 mg by mouth daily.        . metoprolol tartrate (LOPRESSOR) 25 MG tablet Take 25 mg by mouth every morning.        . zolpidem (AMBIEN) 10 MG tablet Take 10 mg by mouth at bedtime as needed.        Marland Kitchen DISCONTD: rosuvastatin (CRESTOR) 20 MG tablet Take 20 mg by mouth daily.          Allergies  Allergen Reactions  . Crestor (Rosuvastatin Calcium)     myalgia     Family History  Problem Relation Age of Onset  . Diabetes Mother   . Heart disease Mother   . Diabetes Father   . Heart disease Father   . Stroke Father   . Diabetes Sister   . Diabetes Brother   . Hypertension Other     BP 138/80  Pulse 54  Temp(Src) 98.3 F (36.8 C) (Oral)  Ht 5\' 10"  (1.778 m)  Wt 198 lb 6.4 oz (89.994 kg)  BMI 28.47 kg/m2  SpO2 94% Review of Systems Denies loc.    Objective:   Physical Exam GENERAL: no distress Pulses: dorsalis pedis intact bilat.   Feet: no deformity.  no ulcer on the feet.  feet are of normal color and temp.  no edema Neuro: sensation is intact to touch on the feet    Lab Results  Component Value Date   HGBA1C 10.2* 07/23/2010   Assessment & Plan:  Dm.  Slow improvement.  therapy limited by pt's request for least expensive meds, and by his need for a simple regimen.

## 2010-07-23 NOTE — Patient Instructions (Addendum)
blood tests are being ordered for you today.  please call [phone] to hear your test results.  You will be prompted to enter the 9-digit "MRN" number that appears at the top left of this page, followed by #.  Then you will hear the message.  pending the test results, please continue nph insulin 60 units each morning, and 20 with the evening meal.  check your blood sugar 2 times a day.  vary the time of day when you check, between before the 3 meals, and at bedtime.  also check if you have symptoms of your blood sugar being too high or too low.  please keep a record of the readings and bring it to your next appointment here.  please call us sooner if you are having low blood sugar episodes.  Please make a follow-up appointment in 3 months.  (update: i left message on phone-tree:  rx as we discussed, except ret 6 weeks).

## 2010-09-06 ENCOUNTER — Encounter: Payer: Self-pay | Admitting: Endocrinology

## 2010-09-06 ENCOUNTER — Ambulatory Visit (INDEPENDENT_AMBULATORY_CARE_PROVIDER_SITE_OTHER): Payer: Medicare Other | Admitting: Endocrinology

## 2010-09-06 DIAGNOSIS — E119 Type 2 diabetes mellitus without complications: Secondary | ICD-10-CM

## 2010-09-06 NOTE — Patient Instructions (Addendum)
please increase nph insulin to 80 units each morning, and 10 with the evening meal.  check your blood sugar 2 times a day.  vary the time of day when you check, between before the 3 meals, and at bedtime.  also check if you have symptoms of your blood sugar being too high or too low.  please keep a record of the readings and bring it to your next appointment here.  please call us sooner if you are having low blood sugar episodes.  Please make a follow-up appointment in 6 weeks.

## 2010-09-06 NOTE — Progress Notes (Signed)
  Subjective:    Patient ID: Steven Osborne, male    DOB: 02-May-1940, 70 y.o.   MRN: [id number]  HPI pt states he feels well in general, except for weight gain.  no cbg record, but states cbg's vary from 79-290.  It is highest before the evening meal (he does not check at hs), and lowest in the am.   Past Medical History  Diagnosis Date  . DIABETES TYPE 2 02/10/2010  . HYPERLIPIDEMIA NOS 02/10/2010  . HYPERTENSION 02/10/2010  . CAD 02/25/2010  . ARTERIOSCLEROSIS 02/10/2010  . INTERMITTENT CLAUDICATION 02/10/2010    Past Surgical History  Procedure Date  . Coronary artery bypass graft 2005    4 vessel bypass     History   Social History  . Marital Status: Divorced    Spouse Name: N/A    Number of Children: N/A  . Years of Education: N/A   Occupational History  . Not on file.   Social History Main Topics  . Smoking status: Former Games developer  . Smokeless tobacco: Current User    Types: Chew  . Alcohol Use: No  . Drug Use: No  . Sexually Active: Not on file   Other Topics Concern  . Not on file   Social History Narrative   Divorced, lives aloneRegular exercise-yes    Current Outpatient Prescriptions on File Prior to Visit  Medication Sig Dispense Refill  . amLODipine (NORVASC) 5 MG tablet Take 5 mg by mouth daily.        . insulin NPH (HUMULIN N) 100 UNIT/ML injection 60 unit each morning, and 20 in the evening  30 mL  12  . lisinopril (PRINIVIL,ZESTRIL) 20 MG tablet Take 20 mg by mouth daily.        . metoprolol tartrate (LOPRESSOR) 25 MG tablet Take 25 mg by mouth every morning.        . pravastatin (PRAVACHOL) 40 MG tablet Take 40 mg by mouth daily.        Marland Kitchen zolpidem (AMBIEN) 10 MG tablet Take 10 mg by mouth at bedtime as needed.          Allergies  Allergen Reactions  . Crestor (Rosuvastatin Calcium)     myalgia    Family History  Problem Relation Age of Onset  . Diabetes Mother   . Heart disease Mother   . Diabetes Father   . Heart disease Father   . Stroke  Father   . Diabetes Sister   . Diabetes Brother   . Hypertension Other     BP 118/82  Pulse 65  Temp(Src) 97 F (36.1 C) (Oral)  Ht 5\' 10"  (1.778 m)  Wt 202 lb 1.9 oz (91.681 kg)  BMI 29.00 kg/m2  SpO2 95% Review of Systems denies hypoglycemia    Objective:   Physical Exam GENERAL: no distress SKIN: Insulin injection sites at the anterior abdomen are normal    Assessment & Plan:  Dm.  therapy limited by pt's need for a simple regimen, and by cost factors.

## 2010-10-22 ENCOUNTER — Ambulatory Visit (INDEPENDENT_AMBULATORY_CARE_PROVIDER_SITE_OTHER): Payer: Medicare Other | Admitting: Endocrinology

## 2010-10-22 ENCOUNTER — Other Ambulatory Visit (INDEPENDENT_AMBULATORY_CARE_PROVIDER_SITE_OTHER): Payer: Medicare Other

## 2010-10-22 ENCOUNTER — Encounter: Payer: Self-pay | Admitting: Endocrinology

## 2010-10-22 VITALS — BP 120/86 | HR 68 | Temp 97.7°F | Resp 16 | Wt 204.0 lb

## 2010-10-22 DIAGNOSIS — E119 Type 2 diabetes mellitus without complications: Secondary | ICD-10-CM

## 2010-10-22 LAB — HEMOGLOBIN A1C: Hgb A1c MFr Bld: 8.3 % — ABNORMAL HIGH (ref 4.6–6.5)

## 2010-10-22 NOTE — Patient Instructions (Addendum)
please change nph insulin to 90 units each morning, and none with the evening meal.  check your blood sugar 2 times a day.  vary the time of day when you check, between before the 3 meals, and at bedtime.  also check if you have symptoms of your blood sugar being too high or too low.  please keep a record of the readings and bring it to your next appointment here.  please call us sooner if you are having low blood sugar episodes.  blood tests are being requested for you today.  please call [phone] to hear your test results.  You will be prompted to enter the 9-digit "MRN" number that appears at the top left of this page, followed by #.  Then you will hear the message. Please make a follow-up appointment in 3 mos (update: i left message on phone-tree:  rx as we discussed)

## 2010-10-22 NOTE — Progress Notes (Signed)
  Subjective:    Patient ID: Steven Osborne, male    DOB: 16-Apr-1940, 70 y.o.   MRN: [id number]  HPI pt states he feels well in general, except for weight gain.  no cbg record, but states cbg's vary from 68 (am) to 180 (afternoon).  Past Medical History  Diagnosis Date  . DIABETES TYPE 2 02/10/2010  . HYPERLIPIDEMIA NOS 02/10/2010  . HYPERTENSION 02/10/2010  . CAD 02/25/2010  . ARTERIOSCLEROSIS 02/10/2010  . INTERMITTENT CLAUDICATION 02/10/2010    Past Surgical History  Procedure Date  . Coronary artery bypass graft 2005    4 vessel bypass     History   Social History  . Marital Status: Divorced    Spouse Name: N/A    Number of Children: N/A  . Years of Education: N/A   Occupational History  . Not on file.   Social History Main Topics  . Smoking status: Former Games developer  . Smokeless tobacco: Current User    Types: Chew  . Alcohol Use: No  . Drug Use: No  . Sexually Active: Not on file   Other Topics Concern  . Not on file   Social History Narrative   Divorced, lives aloneRegular exercise-yes    Current Outpatient Prescriptions on File Prior to Visit  Medication Sig Dispense Refill  . amLODipine (NORVASC) 5 MG tablet Take 5 mg by mouth daily.        . insulin NPH (HUMULIN N,NOVOLIN N) 100 UNIT/ML injection 90 units each morning      . lisinopril (PRINIVIL,ZESTRIL) 20 MG tablet Take 20 mg by mouth daily.        . metoprolol tartrate (LOPRESSOR) 25 MG tablet Take 25 mg by mouth every morning.        . pravastatin (PRAVACHOL) 40 MG tablet Take 40 mg by mouth daily.        Marland Kitchen zolpidem (AMBIEN) 10 MG tablet Take 10 mg by mouth at bedtime as needed.          Allergies  Allergen Reactions  . Crestor (Rosuvastatin Calcium)     myalgia    Family History  Problem Relation Age of Onset  . Diabetes Mother   . Heart disease Mother   . Diabetes Father   . Heart disease Father   . Stroke Father   . Diabetes Sister   . Diabetes Brother   . Hypertension Other     BP  120/86  Pulse 68  Temp(Src) 97.7 F (36.5 C) (Oral)  Resp 16  Wt 204 lb (92.534 kg)  Review of Systems Denies loc    Objective:   Physical Exam Pulses: dorsalis pedis intact bilat.   Feet: no deformity.  no ulcer on the feet.  feet are of normal color and temp.  no edema Neuro: sensation is intact to touch on the feet  Lab Results  Component Value Date   HGBA1C 8.3* 10/22/2010      Assessment & Plan:  Dm, needs increased rx.  therapy limited by pt's need for a simple regimen, and by cost factors.

## 2011-01-21 ENCOUNTER — Encounter: Payer: Self-pay | Admitting: Endocrinology

## 2011-01-21 ENCOUNTER — Ambulatory Visit (INDEPENDENT_AMBULATORY_CARE_PROVIDER_SITE_OTHER): Payer: Medicare Other | Admitting: Endocrinology

## 2011-01-21 ENCOUNTER — Other Ambulatory Visit (INDEPENDENT_AMBULATORY_CARE_PROVIDER_SITE_OTHER): Payer: Medicare Other

## 2011-01-21 VITALS — BP 118/72 | HR 62 | Temp 97.6°F | Ht 70.0 in | Wt 208.0 lb

## 2011-01-21 DIAGNOSIS — Z23 Encounter for immunization: Secondary | ICD-10-CM

## 2011-01-21 DIAGNOSIS — E119 Type 2 diabetes mellitus without complications: Secondary | ICD-10-CM

## 2011-01-21 DIAGNOSIS — E109 Type 1 diabetes mellitus without complications: Secondary | ICD-10-CM

## 2011-01-21 LAB — HEMOGLOBIN A1C: Hgb A1c MFr Bld: 8.3 % — ABNORMAL HIGH (ref 4.6–6.5)

## 2011-01-21 NOTE — Progress Notes (Signed)
  Subjective:    Patient ID: Steven Osborne, male    DOB: 02-Jul-1940, 70 y.o.   MRN: [id number]  HPI Pt returns for f/u of insulin-requiring DM (1987).  no cbg record, but states cbg's vary from 61-260.  It is lowest in the afternoon, after a smaller-than-expected lunch.  The afternoon is also the highest point of the day. Past Medical History  Diagnosis Date  . DIABETES TYPE 2 02/10/2010  . HYPERLIPIDEMIA NOS 02/10/2010  . HYPERTENSION 02/10/2010  . CAD 02/25/2010  . ARTERIOSCLEROSIS 02/10/2010  . INTERMITTENT CLAUDICATION 02/10/2010    Past Surgical History  Procedure Date  . Coronary artery bypass graft 2005    4 vessel bypass     History   Social History  . Marital Status: Divorced    Spouse Name: N/A    Number of Children: N/A  . Years of Education: N/A   Occupational History  . Not on file.   Social History Main Topics  . Smoking status: Former Games developer  . Smokeless tobacco: Current User    Types: Chew  . Alcohol Use: No  . Drug Use: No  . Sexually Active: Not on file   Other Topics Concern  . Not on file   Social History Narrative   Divorced, lives aloneRegular exercise-yes    Current Outpatient Prescriptions on File Prior to Visit  Medication Sig Dispense Refill  . amLODipine (NORVASC) 5 MG tablet Take 5 mg by mouth daily.        . insulin NPH (HUMULIN N,NOVOLIN N) 100 UNIT/ML injection 90 units each morning      . lisinopril (PRINIVIL,ZESTRIL) 20 MG tablet Take 20 mg by mouth daily.        . metoprolol tartrate (LOPRESSOR) 25 MG tablet Take 25 mg by mouth every morning.        . pravastatin (PRAVACHOL) 40 MG tablet Take 40 mg by mouth daily.        Marland Kitchen zolpidem (AMBIEN) 10 MG tablet Take 10 mg by mouth at bedtime as needed.          Allergies  Allergen Reactions  . Crestor (Rosuvastatin Calcium)     myalgia    Family History  Problem Relation Age of Onset  . Diabetes Mother   . Heart disease Mother   . Diabetes Father   . Heart disease Father   .  Stroke Father   . Diabetes Sister   . Diabetes Brother   . Hypertension Other    BP 118/72  Pulse 62  Temp(Src) 97.6 F (36.4 C) (Oral)  Ht 5\' 10"  (1.778 m)  Wt 208 lb (94.348 kg)  BMI 29.84 kg/m2  SpO2 92%  Review of Systems Denies loc    Objective:   Physical Exam VITAL SIGNS:  See vs page GENERAL: no distress SKIN:  Insulin injection sites at the anterior abdomen are normal.  Lab Results  Component Value Date   HGBA1C 8.3* 01/21/2011      Assessment & Plan:  DM, needs increased rx.  therapy limited by pt's need for a simple, inexpensive regimen

## 2011-01-21 NOTE — Patient Instructions (Addendum)
blood tests are being requested for you today.  please call [phone] to hear your test results.  You will be prompted to enter the 9-digit "MRN" number that appears at the top left of this page, followed by #.  Then you will hear the message. pending the test results, please continue nph insulin 90 units each morning, and none with the evening meal.  check your blood sugar 2 times a day.  vary the time of day when you check, between before the 3 meals, and at bedtime.  also check if you have symptoms of your blood sugar being too high or too low.  please keep a record of the readings and bring it to your next appointment here.  please call us sooner if you are having low blood sugar episodes.  this type of insulin schedule works best when meals are at consistent, and of consistent amounts.   Please make a follow-up appointment in 3 mos. (update: i left message on phone-tree:  Increase insulin to 100 qam.  Don't miss meals).

## 2011-04-22 ENCOUNTER — Ambulatory Visit: Payer: Medicare Other | Admitting: Endocrinology

## 2011-05-03 ENCOUNTER — Ambulatory Visit: Payer: Medicare Other | Admitting: Endocrinology

## 2011-05-18 ENCOUNTER — Other Ambulatory Visit (INDEPENDENT_AMBULATORY_CARE_PROVIDER_SITE_OTHER): Payer: Medicare Other

## 2011-05-18 ENCOUNTER — Ambulatory Visit (INDEPENDENT_AMBULATORY_CARE_PROVIDER_SITE_OTHER): Payer: Medicare Other | Admitting: Endocrinology

## 2011-05-18 ENCOUNTER — Encounter: Payer: Self-pay | Admitting: Endocrinology

## 2011-05-18 VITALS — BP 148/92 | HR 64 | Temp 97.6°F | Ht 70.0 in | Wt 211.8 lb

## 2011-05-18 DIAGNOSIS — E119 Type 2 diabetes mellitus without complications: Secondary | ICD-10-CM

## 2011-05-18 LAB — HEMOGLOBIN A1C: Hgb A1c MFr Bld: 9.3 % — ABNORMAL HIGH (ref 4.6–6.5)

## 2011-05-18 NOTE — Progress Notes (Signed)
  Subjective:    Patient ID: Steven Osborne, male    DOB: [birth year], 71 y.o.   MRN: [id number]  HPI Pt returns for f/u of insulin-requiring DM (1987).  no cbg record, but states cbg's vary from 164-250.  It is lowest in the afternoon, after a smaller-than-expected lunch.  There is no trend throughout the day.  He is not sure why it has been elevated, except for a persistent uri. He did not increase the insulin, as advised.   Past Medical History  Diagnosis Date  . DIABETES TYPE 2 02/10/2010  . HYPERLIPIDEMIA NOS 02/10/2010  . HYPERTENSION 02/10/2010  . CAD 02/25/2010  . ARTERIOSCLEROSIS 02/10/2010  . INTERMITTENT CLAUDICATION 02/10/2010    Past Surgical History  Procedure Date  . Coronary artery bypass graft 2005    4 vessel bypass     History   Social History  . Marital Status: Divorced    Spouse Name: N/A    Number of Children: N/A  . Years of Education: N/A   Occupational History  . Not on file.   Social History Main Topics  . Smoking status: Former Games developer  . Smokeless tobacco: Current User    Types: Chew  . Alcohol Use: No  . Drug Use: No  . Sexually Active: Not on file   Other Topics Concern  . Not on file   Social History Narrative   Divorced, lives aloneRegular exercise-yes    Current Outpatient Prescriptions on File Prior to Visit  Medication Sig Dispense Refill  . amLODipine (NORVASC) 5 MG tablet Take 5 mg by mouth daily.        . insulin NPH (HUMULIN N,NOVOLIN N) 100 UNIT/ML injection Inject 110 Units into the skin daily before breakfast.       . lisinopril (PRINIVIL,ZESTRIL) 20 MG tablet Take 20 mg by mouth daily.        . metoprolol tartrate (LOPRESSOR) 25 MG tablet Take 25 mg by mouth every morning.        . pravastatin (PRAVACHOL) 40 MG tablet Take 40 mg by mouth daily.        Marland Kitchen zolpidem (AMBIEN) 10 MG tablet Take 10 mg by mouth at bedtime as needed.          Allergies  Allergen Reactions  . Crestor (Rosuvastatin Calcium)     myalgia    Family  History  Problem Relation Age of Onset  . Diabetes Mother   . Heart disease Mother   . Diabetes Father   . Heart disease Father   . Stroke Father   . Diabetes Sister   . Diabetes Brother   . Hypertension Other    BP 148/92  Pulse 64  Temp(Src) 97.6 F (36.4 C) (Oral)  Ht 5\' 10"  (1.778 m)  Wt 211 lb 12.8 oz (96.072 kg)  BMI 30.39 kg/m2  SpO2 94%  Review of Systems denies hypoglycemia    Objective:   Physical Exam VITAL SIGNS:  See vs page GENERAL: no distress Pulses: dorsalis pedis intact bilat.   Feet: no deformity.  no ulcer on the feet.  feet are of normal color and temp.  no edema Neuro: sensation is intact to touch on the feet. Lab Results  Component Value Date   HGBA1C 9.3* 05/18/2011      Assessment & Plan:  DM, needs increased rx.

## 2011-05-18 NOTE — Patient Instructions (Addendum)
blood tests are being requested for you today.  You will receive a letter with results.  pending the test results, please increase nph insulin to 110 units each morning, and none with the evening meal.  check your blood sugar 2 times a day.  vary the time of day when you check, between before the 3 meals, and at bedtime.  also check if you have symptoms of your blood sugar being too high or too low.  please keep a record of the readings and bring it to your next appointment here.  please call us sooner if you are having low blood sugar episodes.  this type of insulin schedule works best when meals are at consistent, and of consistent amounts.   Please make a follow-up appointment in 3 mos. (see letter)

## 2011-05-19 ENCOUNTER — Telehealth: Payer: Self-pay | Admitting: *Deleted

## 2011-05-19 NOTE — Telephone Encounter (Signed)
Called pt to inform of A1c results. (No answer, number busy). Letter also mailed to pt

## 2011-05-24 NOTE — Telephone Encounter (Signed)
Left message on machine for pt to return my call regarding recent lab result.

## 2011-05-26 NOTE — Telephone Encounter (Signed)
Left message for pt to callback office.  

## 2011-05-30 NOTE — Telephone Encounter (Signed)
Pt advised.

## 2011-05-30 NOTE — Telephone Encounter (Signed)
Left message for pt to callback office.  

## 2011-08-17 ENCOUNTER — Other Ambulatory Visit (INDEPENDENT_AMBULATORY_CARE_PROVIDER_SITE_OTHER): Payer: Medicare Other

## 2011-08-17 ENCOUNTER — Ambulatory Visit (INDEPENDENT_AMBULATORY_CARE_PROVIDER_SITE_OTHER): Payer: Medicare Other | Admitting: Endocrinology

## 2011-08-17 ENCOUNTER — Encounter: Payer: Self-pay | Admitting: Endocrinology

## 2011-08-17 VITALS — BP 132/70 | HR 60 | Temp 98.2°F | Ht 70.0 in | Wt 211.0 lb

## 2011-08-17 DIAGNOSIS — E1059 Type 1 diabetes mellitus with other circulatory complications: Secondary | ICD-10-CM

## 2011-08-17 DIAGNOSIS — I798 Other disorders of arteries, arterioles and capillaries in diseases classified elsewhere: Secondary | ICD-10-CM

## 2011-08-17 LAB — HEMOGLOBIN A1C: Hgb A1c MFr Bld: 8.6 % — ABNORMAL HIGH (ref 4.6–6.5)

## 2011-08-17 MED ORDER — INSULIN NPH ISOPHANE & REGULAR (70-30) 100 UNIT/ML ~~LOC~~ SUSP: 110.0000 [IU] | Freq: Every day | SUBCUTANEOUS | Status: DC

## 2011-08-17 NOTE — Progress Notes (Signed)
  Subjective:    Patient ID: Steven Osborne, male    DOB: [birth year], 71 y.o.   MRN: [id number]  HPI Pt returns for f/u of insulin-requiring DM (dx'ed 1987; complicated by PAD and CAD).  no cbg record, but states cbg's vary from 62-300.  There is no trend throughout the day, except it is lowest in am. He says he never misses the insulin.   Past Medical History  Diagnosis Date  . DIABETES TYPE 2 02/10/2010  . HYPERLIPIDEMIA NOS 02/10/2010  . HYPERTENSION 02/10/2010  . CAD 02/25/2010  . ARTERIOSCLEROSIS 02/10/2010  . INTERMITTENT CLAUDICATION 02/10/2010    Past Surgical History  Procedure Date  . Coronary artery bypass graft 2005    4 vessel bypass     History   Social History  . Marital Status: Divorced    Spouse Name: N/A    Number of Children: N/A  . Years of Education: N/A   Occupational History  . Not on file.   Social History Main Topics  . Smoking status: Former Games developer  . Smokeless tobacco: Current User    Types: Chew  . Alcohol Use: No  . Drug Use: No  . Sexually Active: Not on file   Other Topics Concern  . Not on file   Social History Narrative   Divorced, lives aloneRegular exercise-yes    Current Outpatient Prescriptions on File Prior to Visit  Medication Sig Dispense Refill  . amLODipine (NORVASC) 5 MG tablet Take 5 mg by mouth daily.        Marland Kitchen lisinopril (PRINIVIL,ZESTRIL) 20 MG tablet Take 20 mg by mouth daily.        . metoprolol tartrate (LOPRESSOR) 25 MG tablet Take 25 mg by mouth every morning.        Marland Kitchen omeprazole (PRILOSEC) 20 MG capsule Take 20 mg by mouth daily.       . pravastatin (PRAVACHOL) 40 MG tablet Take 40 mg by mouth daily.        Marland Kitchen zolpidem (AMBIEN) 10 MG tablet Take 10 mg by mouth at bedtime as needed.        . insulin NPH-insulin regular (NOVOLIN 70/30 RELION) (70-30) 100 UNIT/ML injection Inject 110 Units into the skin daily with breakfast.  40 mL  12    Allergies  Allergen Reactions  . Crestor (Rosuvastatin Calcium)     myalgia      Family History  Problem Relation Age of Onset  . Diabetes Mother   . Heart disease Mother   . Diabetes Father   . Heart disease Father   . Stroke Father   . Diabetes Sister   . Diabetes Brother   . Hypertension Other     BP 132/70  Pulse 60  Temp 98.2 F (36.8 C) (Oral)  Ht 5\' 10"  (1.778 m)  Wt 211 lb (95.709 kg)  BMI 30.28 kg/m2  SpO2 94%    Review of Systems Denies LOC    Objective:   Physical Exam VITAL SIGNS:  See vs page GENERAL: no distress NECK: There is no palpable thyroid enlargement.  No thyroid nodule is palpable.  No palpable lymphadenopathy at the anterior neck.  Lab Results  Component Value Date   HGBA1C 8.6* 08/17/2011      Assessment & Plan:  DM.  Based on her cbg's, she needs a shorter-acting insulin.  therapy limited by pt's need for a simple (QD) regimen.

## 2011-08-17 NOTE — Patient Instructions (Addendum)
blood tests are being requested for you today.  You will receive a letter with results.  pending the test results, please continue nph insulin, 110 units each morning, and none with the evening meal.  check your blood sugar 2 times a day.  vary the time of day when you check, between before the 3 meals, and at bedtime.  also check if you have symptoms of your blood sugar being too high or too low.  please keep a record of the readings and bring it to your next appointment here.  please call us sooner if you are having low blood sugar episodes.  this type of insulin schedule works best when meals are at consistent times, and of consistent amounts.   Please make a follow-up appointment in 3 mos.

## 2011-08-18 ENCOUNTER — Telehealth: Payer: Self-pay | Admitting: *Deleted

## 2011-08-18 NOTE — Telephone Encounter (Signed)
Called pt to inform of lab results, pt informed (letter also mailed to pt). 

## 2011-09-13 ENCOUNTER — Telehealth: Payer: Self-pay

## 2011-09-13 NOTE — Telephone Encounter (Signed)
Pt called stating that insulin is too expensive  - $25/vial. Pt was upset and stated that we "better fix it or I'm getting another doctor". He also stated that he has not taken his insulin for 4 days because of cost. I advised pt that I would call pharmacy for additional information. I called pharmacy asking for cheaper alternative and was advised that pt is in "donut hole" and that this would be the cheapest insulin for him. There are no samples of this insulin, please advise.

## 2011-09-13 NOTE — Telephone Encounter (Signed)
i'm sorry but this is the cheapest insulin there is

## 2011-09-13 NOTE — Telephone Encounter (Signed)
Pt informed of MD's advisement. Pt states that he will come up to office tomorrow to discuss with Dr. Everardo All regarding insulin.

## 2011-11-17 ENCOUNTER — Ambulatory Visit (INDEPENDENT_AMBULATORY_CARE_PROVIDER_SITE_OTHER): Payer: Medicare Other | Admitting: Endocrinology

## 2011-11-17 ENCOUNTER — Other Ambulatory Visit (INDEPENDENT_AMBULATORY_CARE_PROVIDER_SITE_OTHER): Payer: Medicare Other

## 2011-11-17 ENCOUNTER — Encounter: Payer: Self-pay | Admitting: Endocrinology

## 2011-11-17 VITALS — BP 138/82 | HR 72 | Temp 97.4°F | Ht 70.0 in | Wt 212.0 lb

## 2011-11-17 DIAGNOSIS — E1059 Type 1 diabetes mellitus with other circulatory complications: Secondary | ICD-10-CM

## 2011-11-17 DIAGNOSIS — I798 Other disorders of arteries, arterioles and capillaries in diseases classified elsewhere: Secondary | ICD-10-CM

## 2011-11-17 LAB — HEMOGLOBIN A1C: Hgb A1c MFr Bld: 8.4 % — ABNORMAL HIGH (ref 4.6–6.5)

## 2011-11-17 NOTE — Patient Instructions (Addendum)
blood tests are being requested for you today.  You will be contacted with results.   pending the test results, please continue the 70/30 insulin, 110 units each morning, and none with the evening meal.  check your blood sugar 2 times a day.  vary the time of day when you check, between before the 3 meals, and at bedtime.  also check if you have symptoms of your blood sugar being too high or too low.  please keep a record of the readings and bring it to your next appointment here.  please call us sooner if you are having low blood sugar episodes.   this type of insulin schedule works best when meals are at consistent times, and of consistent amounts.   Please make a follow-up appointment in 3-4 months.   (update: pt is advised to take 130 units every morning.  However, if you are going to be active, or eat very little, just take 100 units).

## 2011-11-17 NOTE — Progress Notes (Signed)
Subjective:    Patient ID: Steven Osborne, male    DOB: [birth year], 71 y.o.   MRN: [id number]  HPI Pt returns for f/u of insulin-requiring DM (dx'ed 1987; complicated by PAD and CAD; therapy limited by pt's need for a simple, inepensive regimen).  no cbg record, but states cbg's vary from 60-200's, but most are in the 100's.  There is no trend throughout the day, but it is lowest when a meal is missed.   Past Medical History  Diagnosis Date  . DIABETES TYPE 2 02/10/2010  . HYPERLIPIDEMIA NOS 02/10/2010  . HYPERTENSION 02/10/2010  . CAD 02/25/2010  . ARTERIOSCLEROSIS 02/10/2010  . INTERMITTENT CLAUDICATION 02/10/2010    Past Surgical History  Procedure Date  . Coronary artery bypass graft 2005    4 vessel bypass     History   Social History  . Marital Status: Divorced    Spouse Name: N/A    Number of Children: N/A  . Years of Education: N/A   Occupational History  . Not on file.   Social History Main Topics  . Smoking status: Former Games developer  . Smokeless tobacco: Current User    Types: Chew  . Alcohol Use: No  . Drug Use: No  . Sexually Active: Not on file   Other Topics Concern  . Not on file   Social History Narrative   Divorced, lives aloneRegular exercise-yes    Current Outpatient Prescriptions on File Prior to Visit  Medication Sig Dispense Refill  . amLODipine (NORVASC) 5 MG tablet Take 5 mg by mouth daily.        . insulin NPH-insulin regular (NOVOLIN 70/30 RELION) (70-30) 100 UNIT/ML injection Inject 110 Units into the skin daily with breakfast.  40 mL  12  . lisinopril (PRINIVIL,ZESTRIL) 20 MG tablet Take 20 mg by mouth daily.        . metoprolol tartrate (LOPRESSOR) 25 MG tablet Take 25 mg by mouth every morning.        Marland Kitchen omeprazole (PRILOSEC) 20 MG capsule Take 20 mg by mouth daily.       . pravastatin (PRAVACHOL) 40 MG tablet Take 40 mg by mouth daily.        . timolol (TIMOPTIC) 0.25 % ophthalmic solution       . zolpidem (AMBIEN) 10 MG tablet Take 10 mg  by mouth at bedtime as needed.          Allergies  Allergen Reactions  . Crestor (Rosuvastatin Calcium)     myalgia    Family History  Problem Relation Age of Onset  . Diabetes Mother   . Heart disease Mother   . Diabetes Father   . Heart disease Father   . Stroke Father   . Diabetes Sister   . Diabetes Brother   . Hypertension Other     BP 138/82  Pulse 72  Temp 97.4 F (36.3 C) (Oral)  Ht 5\' 10"  (1.778 m)  Wt 212 lb (96.163 kg)  BMI 30.42 kg/m2  SpO2 95%   Review of Systems Denies LOC    Objective:   Physical Exam VITAL SIGNS:  See vs page GENERAL: no distress Pulses: dorsalis pedis intact bilat.   Feet: no deformity.  no ulcer on the feet.  feet are of normal color and temp.  Trace bilat leg edema Neuro: sensation is intact to touch on the feet.    Lab Results  Component Value Date   HGBA1C 8.4* 11/17/2011      Assessment &  Plan:  DM, needs increased rx

## 2011-11-22 ENCOUNTER — Telehealth: Payer: Self-pay | Admitting: *Deleted

## 2011-11-22 NOTE — Telephone Encounter (Signed)
Called pt to inform of A1c results since there is no address in his chart to mail letter to, left message for pt to callback office.

## 2011-11-24 NOTE — Telephone Encounter (Signed)
Left message for pt to callback office.  

## 2011-12-02 NOTE — Telephone Encounter (Signed)
Called pt, no answer/unable to leave message. Closing phone note after several attempts to reach pt with no callback.

## 2012-03-16 ENCOUNTER — Encounter: Payer: Self-pay | Admitting: Endocrinology

## 2012-03-16 ENCOUNTER — Ambulatory Visit (INDEPENDENT_AMBULATORY_CARE_PROVIDER_SITE_OTHER): Payer: Medicare Other | Admitting: Endocrinology

## 2012-03-16 VITALS — BP 130/80 | HR 61 | Temp 98.6°F | Wt 218.0 lb

## 2012-03-16 DIAGNOSIS — E1059 Type 1 diabetes mellitus with other circulatory complications: Secondary | ICD-10-CM

## 2012-03-16 LAB — MICROALBUMIN / CREATININE URINE RATIO
Creatinine,U: 111.7 mg/dL
Microalb Creat Ratio: 64.5 mg/g — ABNORMAL HIGH (ref 0.0–30.0)
Microalb, Ur: 72 mg/dL — ABNORMAL HIGH (ref 0.0–1.9)

## 2012-03-16 LAB — HEMOGLOBIN A1C: Hgb A1c MFr Bld: 8.8 % — ABNORMAL HIGH (ref 4.6–6.5)

## 2012-03-16 NOTE — Patient Instructions (Addendum)
blood tests are being requested for you today.  You will be contacted with results.   pending the test results, please increase the 70/30 insulin to 130 units each morning, and none with the evening meal.  check your blood sugar 2 times a day.  vary the time of day when you check, between before the 3 meals, and at bedtime.  also check if you have symptoms of your blood sugar being too high or too low.  please keep a record of the readings and bring it to your next appointment here.  please call us sooner if you are having low blood sugar episodes.   this type of insulin schedule works best when meals are at consistent times, and of consistent amounts.   Please make a follow-up appointment in 3 months.

## 2012-03-16 NOTE — Progress Notes (Signed)
  Subjective:    Patient ID: Steven Osborne, male    DOB: 03-24-1940, 72 y.o.   MRN: [id number]  HPI Pt returns for f/u of insulin-requiring DM (dx'ed 1987; complicated by nephropathy, PAD and CAD; therapy limited by pt's need for a simple, inepensive regimen).  no cbg record, but states cbg's are in the high-100's, but most are in the 100's.  He did not increase the insulin as advised.    Past Medical History  Diagnosis Date  . DIABETES TYPE 2 02/10/2010  . HYPERLIPIDEMIA NOS 02/10/2010  . HYPERTENSION 02/10/2010  . CAD 02/25/2010  . ARTERIOSCLEROSIS 02/10/2010  . INTERMITTENT CLAUDICATION 02/10/2010    Past Surgical History  Procedure Date  . Coronary artery bypass graft 2005    4 vessel bypass     History   Social History  . Marital Status: Divorced    Spouse Name: N/A    Number of Children: N/A  . Years of Education: N/A   Occupational History  . Not on file.   Social History Main Topics  . Smoking status: Former Games developer  . Smokeless tobacco: Current User    Types: Chew  . Alcohol Use: No  . Drug Use: No  . Sexually Active: Not on file   Other Topics Concern  . Not on file   Social History Narrative   Divorced, lives aloneRegular exercise-yes    Current Outpatient Prescriptions on File Prior to Visit  Medication Sig Dispense Refill  . amLODipine (NORVASC) 5 MG tablet Take 5 mg by mouth daily.        . insulin NPH-insulin regular (NOVOLIN 70/30) (70-30) 100 UNIT/ML injection Inject 130 Units into the skin daily with breakfast.      . lisinopril (PRINIVIL,ZESTRIL) 20 MG tablet Take 20 mg by mouth daily.        . metoprolol tartrate (LOPRESSOR) 25 MG tablet Take 25 mg by mouth every morning.        Marland Kitchen omeprazole (PRILOSEC) 20 MG capsule Take 20 mg by mouth daily.       . pravastatin (PRAVACHOL) 40 MG tablet Take 40 mg by mouth daily.        . timolol (TIMOPTIC) 0.25 % ophthalmic solution       . zolpidem (AMBIEN) 10 MG tablet Take 10 mg by mouth at bedtime as needed.           Allergies  Allergen Reactions  . Crestor (Rosuvastatin Calcium)     myalgia    Family History  Problem Relation Age of Onset  . Diabetes Mother   . Heart disease Mother   . Diabetes Father   . Heart disease Father   . Stroke Father   . Diabetes Sister   . Diabetes Brother   . Hypertension Other     BP 130/80  Pulse 61  Temp 98.6 F (37 C) (Oral)  Wt 218 lb (98.884 kg)  SpO2 96%  Review of Systems denies hypoglycemia.      Objective:   Physical Exam VITAL SIGNS:  See vs page GENERAL: no distress SKIN:  Insulin injection sites at the anterior abdomen are normal  Lab Results  Component Value Date   HGBA1C 8.8* 03/16/2012      Assessment & Plan:  DM: therapy limited by noncompliance.  i'll do the best i can.needs increased rx.

## 2012-08-15 ENCOUNTER — Ambulatory Visit (INDEPENDENT_AMBULATORY_CARE_PROVIDER_SITE_OTHER): Payer: Medicare Other | Admitting: Endocrinology

## 2012-08-15 ENCOUNTER — Encounter: Payer: Self-pay | Admitting: Endocrinology

## 2012-08-15 VITALS — BP 132/72 | HR 65 | Ht 70.0 in | Wt 218.0 lb

## 2012-08-15 DIAGNOSIS — E1059 Type 1 diabetes mellitus with other circulatory complications: Secondary | ICD-10-CM

## 2012-08-15 LAB — HEMOGLOBIN A1C: Hgb A1c MFr Bld: 9.2 % — ABNORMAL HIGH (ref 4.6–6.5)

## 2012-08-15 NOTE — Patient Instructions (Addendum)
blood tests are being requested for you today.  We'll contact you with results.   pending the test results, please continue the 70/30 insulin, 130 units each morning, and none with the evening meal.  check your blood sugar 2 times a day.  vary the time of day when you check, between before the 3 meals, and at bedtime.  also check if you have symptoms of your blood sugar being too high or too low.  please keep a record of the readings and bring it to your next appointment here.  please call us sooner if you are having low blood sugar episodes.   On this type of insulin schedule, you should eat meals on a regular schedule.  If a meal is missed or significantly delayed, your blood sugar could go low. Please make a follow-up appointment in 3 months.

## 2012-08-15 NOTE — Progress Notes (Signed)
Subjective:    Patient ID: Steven Osborne, male    DOB: [birth year], 72 y.o.   MRN: [id number]  HPI Pt returns for f/u of insulin-requiring DM (dx'ed 1987; he has mild if any neuropathy of the lower extremities, but he has associated nephropathy, PAD and CAD; therapy has been limited by pt's need for a simple, inepensive regimen).  He does not check cbg's.  pt states he feels well in general. Past Medical History  Diagnosis Date  . DIABETES TYPE 2 02/10/2010  . HYPERLIPIDEMIA NOS 02/10/2010  . HYPERTENSION 02/10/2010  . CAD 02/25/2010  . ARTERIOSCLEROSIS 02/10/2010  . INTERMITTENT CLAUDICATION 02/10/2010    Past Surgical History  Procedure Laterality Date  . Coronary artery bypass graft  2005    4 vessel bypass     History   Social History  . Marital Status: Divorced    Spouse Name: N/A    Number of Children: N/A  . Years of Education: N/A   Occupational History  . Not on file.   Social History Main Topics  . Smoking status: Former Games developer  . Smokeless tobacco: Current User    Types: Chew  . Alcohol Use: No  . Drug Use: No  . Sexually Active: Not on file   Other Topics Concern  . Not on file   Social History Narrative   Divorced, lives alone   Regular exercise-yes    Current Outpatient Prescriptions on File Prior to Visit  Medication Sig Dispense Refill  . amLODipine (NORVASC) 5 MG tablet Take 5 mg by mouth daily.        . insulin NPH-insulin regular (NOVOLIN 70/30) (70-30) 100 UNIT/ML injection Inject 160 Units into the skin daily with breakfast.       . lisinopril (PRINIVIL,ZESTRIL) 20 MG tablet Take 20 mg by mouth daily.        . metoprolol tartrate (LOPRESSOR) 25 MG tablet Take 25 mg by mouth every morning.        Marland Kitchen omeprazole (PRILOSEC) 20 MG capsule Take 20 mg by mouth daily.       . pravastatin (PRAVACHOL) 40 MG tablet Take 40 mg by mouth daily.        . timolol (TIMOPTIC) 0.25 % ophthalmic solution       . zolpidem (AMBIEN) 10 MG tablet Take 10 mg by mouth  at bedtime as needed.         No current facility-administered medications on file prior to visit.    Allergies  Allergen Reactions  . Crestor (Rosuvastatin Calcium)     myalgia    Family History  Problem Relation Age of Onset  . Diabetes Mother   . Heart disease Mother   . Diabetes Father   . Heart disease Father   . Stroke Father   . Diabetes Sister   . Diabetes Brother   . Hypertension Other    BP 132/72  Pulse 65  Ht 5\' 10"  (1.778 m)  Wt 218 lb (98.884 kg)  BMI 31.28 kg/m2  SpO2 97%  Review of Systems denies hypoglycemia.  He has weight gain.      Objective:   Physical Exam VITAL SIGNS:  See vs page GENERAL: no distress.  Lab Results  Component Value Date   HGBA1C 9.2* 08/15/2012      Assessment & Plan:  DM: This insulin regimen was chosen from multiple options, for its relative simplicity and low cost.  The benefits of glycemic control must be weighed against the risks of hypoglycemia.  He needs increased rx.

## 2012-08-23 ENCOUNTER — Other Ambulatory Visit: Payer: Self-pay | Admitting: *Deleted

## 2012-08-23 MED ORDER — INSULIN NPH ISOPHANE & REGULAR (70-30) 100 UNIT/ML ~~LOC~~ SUSP: 110.0000 [IU] | Freq: Every day | SUBCUTANEOUS | Status: DC

## 2012-08-31 ENCOUNTER — Other Ambulatory Visit: Payer: Self-pay | Admitting: *Deleted

## 2012-08-31 MED ORDER — INSULIN NPH ISOPHANE & REGULAR (70-30) 100 UNIT/ML ~~LOC~~ SUSP: 110.0000 [IU] | Freq: Every day | SUBCUTANEOUS | Status: DC

## 2012-09-24 ENCOUNTER — Other Ambulatory Visit: Payer: Self-pay

## 2012-09-24 MED ORDER — INSULIN NPH ISOPHANE & REGULAR (70-30) 100 UNIT/ML ~~LOC~~ SUSP: 160.0000 [IU] | Freq: Every day | SUBCUTANEOUS | Status: DC

## 2012-11-16 ENCOUNTER — Ambulatory Visit (INDEPENDENT_AMBULATORY_CARE_PROVIDER_SITE_OTHER): Payer: Medicare Other | Admitting: Endocrinology

## 2012-11-16 ENCOUNTER — Encounter: Payer: Self-pay | Admitting: Endocrinology

## 2012-11-16 VITALS — BP 122/80 | HR 64 | Ht 70.0 in | Wt 220.0 lb

## 2012-11-16 DIAGNOSIS — E1059 Type 1 diabetes mellitus with other circulatory complications: Secondary | ICD-10-CM

## 2012-11-16 DIAGNOSIS — Z23 Encounter for immunization: Secondary | ICD-10-CM

## 2012-11-16 NOTE — Patient Instructions (Addendum)
blood tests are being requested for you today.  We'll contact you with results.   check your blood sugar 2 times a day.  vary the time of day when you check, between before the 3 meals, and at bedtime.  also check if you have symptoms of your blood sugar being too high or too low.  please keep a record of the readings and bring it to your next appointment here.  please call us sooner if you are having low blood sugar episodes.   On this type of insulin schedule, you should eat meals on a regular schedule.  If a meal is missed or significantly delayed, your blood sugar could go low. Please make a follow-up appointment in January. Here is some sample insulin.

## 2012-11-16 NOTE — Progress Notes (Signed)
Subjective:    Patient ID: Steven Osborne, male    DOB: 1940-08-02, 72 y.o.   MRN: [id number]  HPI Pt returns for f/u of insulin-requiring DM (dx'ed 1987; he has mild if any neuropathy of the lower extremities, but he has associated nephropathy, PAD and CAD; therapy has been limited by pt's need for a simple, inepensive regimen).  He does not check cbg's.  pt states he feels well in general.  He takes only 100 units qam, as he says he cannot afford any more.   Past Medical History  Diagnosis Date  . DIABETES TYPE 2 02/10/2010  . HYPERLIPIDEMIA NOS 02/10/2010  . HYPERTENSION 02/10/2010  . CAD 02/25/2010  . ARTERIOSCLEROSIS 02/10/2010  . INTERMITTENT CLAUDICATION 02/10/2010    Past Surgical History  Procedure Laterality Date  . Coronary artery bypass graft  2005    4 vessel bypass     History   Social History  . Marital Status: Divorced    Spouse Name: N/A    Number of Children: N/A  . Years of Education: N/A   Occupational History  . Not on file.   Social History Main Topics  . Smoking status: Former Games developer  . Smokeless tobacco: Current User    Types: Chew  . Alcohol Use: No  . Drug Use: No  . Sexual Activity: Not on file   Other Topics Concern  . Not on file   Social History Narrative   Divorced, lives alone   Regular exercise-yes    Current Outpatient Prescriptions on File Prior to Visit  Medication Sig Dispense Refill  . amLODipine (NORVASC) 5 MG tablet Take 5 mg by mouth daily.        . insulin NPH-regular (NOVOLIN 70/30) (70-30) 100 UNIT/ML injection Inject 160 Units into the skin daily with breakfast.  30 mL  2  . lisinopril (PRINIVIL,ZESTRIL) 20 MG tablet Take 20 mg by mouth daily.        . metoprolol tartrate (LOPRESSOR) 25 MG tablet Take 25 mg by mouth every morning.        Marland Kitchen omeprazole (PRILOSEC) 20 MG capsule Take 20 mg by mouth daily.       . pravastatin (PRAVACHOL) 40 MG tablet Take 40 mg by mouth daily.        . timolol (TIMOPTIC) 0.25 % ophthalmic  solution       . zolpidem (AMBIEN) 10 MG tablet Take 10 mg by mouth at bedtime as needed.         No current facility-administered medications on file prior to visit.    Allergies  Allergen Reactions  . Crestor [Rosuvastatin Calcium]     myalgia    Family History  Problem Relation Age of Onset  . Diabetes Mother   . Heart disease Mother   . Diabetes Father   . Heart disease Father   . Stroke Father   . Diabetes Sister   . Diabetes Brother   . Hypertension Other    BP 122/80  Pulse 64  Ht 5\' 10"  (1.778 m)  Wt 220 lb (99.791 kg)  BMI 31.57 kg/m2  SpO2 97%  Review of Systems denies hypoglycemia and weight change    Objective:   Physical Exam VITAL SIGNS:  See vs page GENERAL: no distress  Lab Results  Component Value Date   HGBA1C 9.6* 11/16/2012      Assessment & Plan:  DM: This insulin regimen was chosen from multiple options, for its relative simplicity and low cost.  The  benefits of glycemic control must be weighed against the risks of hypoglycemia.  therapy limited by noncompliance.  i'll do the best i can. CAD: in this setting, he should avoid hypoglycemia.

## 2012-12-25 ENCOUNTER — Other Ambulatory Visit: Payer: Self-pay | Admitting: Endocrinology

## 2012-12-26 ENCOUNTER — Other Ambulatory Visit: Payer: Self-pay

## 2013-03-18 ENCOUNTER — Encounter: Payer: Self-pay | Admitting: Endocrinology

## 2013-03-18 ENCOUNTER — Ambulatory Visit (INDEPENDENT_AMBULATORY_CARE_PROVIDER_SITE_OTHER): Payer: Medicare Other | Admitting: Endocrinology

## 2013-03-18 VITALS — BP 126/74 | HR 62 | Temp 97.8°F | Ht 70.0 in | Wt 211.0 lb

## 2013-03-18 DIAGNOSIS — E1059 Type 1 diabetes mellitus with other circulatory complications: Secondary | ICD-10-CM

## 2013-03-18 LAB — MICROALBUMIN / CREATININE URINE RATIO
Creatinine,U: 128 mg/dL
Microalb Creat Ratio: 55.5 mg/g — ABNORMAL HIGH (ref 0.0–30.0)
Microalb, Ur: 71 mg/dL — ABNORMAL HIGH (ref 0.0–1.9)

## 2013-03-18 LAB — HEMOGLOBIN A1C: HEMOGLOBIN A1C: 8.3 % — AB (ref 4.6–6.5)

## 2013-03-18 NOTE — Patient Instructions (Addendum)
blood and urine tests are being requested for you today.   check your blood sugar 2 times a day.  vary the time of day when you check, between before the 3 meals, and at bedtime.  also check if you have symptoms of your blood sugar being too high or too low.  please keep a record of the readings and bring it to your next appointment here.  please call us sooner if you are having low blood sugar episodes.   On this type of insulin schedule, you should eat meals on a regular schedule.  If a meal is missed or significantly delayed, your blood sugar could go low. Please make a follow-up appointment in 3 months.   We'll send you a letter with results.

## 2013-03-18 NOTE — Progress Notes (Signed)
Subjective:    Patient ID: Steven Osborne, male    DOB: 01-27-1941, 73 y.o.   MRN: [id number]  HPI Pt returns for f/u of insulin-requiring DM (dx'ed 1987, on a routine blood test; he has mild if any neuropathy of the lower extremities, but he has associated nephropathy, PAD and CAD; he has been on insulin since approx 2010; therapy has been limited by pt's need for a simple, inepensive regimen; he has never had severe hypoglycemia or DKA).  He seldom checks cbg's, but he says he takes insulin as rx'ed.  no cbg record, but states cbg's vary from 100-200's.  It is in general higher as the day goes on.   Past Medical History  Diagnosis Date  . DIABETES TYPE 2 02/10/2010  . HYPERLIPIDEMIA NOS 02/10/2010  . HYPERTENSION 02/10/2010  . CAD 02/25/2010  . ARTERIOSCLEROSIS 02/10/2010  . INTERMITTENT CLAUDICATION 02/10/2010    Past Surgical History  Procedure Laterality Date  . Coronary artery bypass graft  2005    4 vessel bypass     History   Social History  . Marital Status: Divorced    Spouse Name: N/A    Number of Children: N/A  . Years of Education: N/A   Occupational History  . Not on file.   Social History Main Topics  . Smoking status: Former Games developermoker  . Smokeless tobacco: Current User    Types: Chew  . Alcohol Use: No  . Drug Use: No  . Sexual Activity: Not on file   Other Topics Concern  . Not on file   Social History Narrative   Divorced, lives alone   Regular exercise-yes    Current Outpatient Prescriptions on File Prior to Visit  Medication Sig Dispense Refill  . amLODipine (NORVASC) 5 MG tablet Take 5 mg by mouth daily.        Marland Kitchen. lisinopril (PRINIVIL,ZESTRIL) 20 MG tablet Take 20 mg by mouth daily.        . metoprolol tartrate (LOPRESSOR) 25 MG tablet Take 25 mg by mouth every morning.        Marland Kitchen. omeprazole (PRILOSEC) 20 MG capsule Take 20 mg by mouth daily.       . pravastatin (PRAVACHOL) 40 MG tablet Take 40 mg by mouth daily.        . timolol (TIMOPTIC) 0.25 %  ophthalmic solution       . zolpidem (AMBIEN) 10 MG tablet Take 10 mg by mouth at bedtime as needed.         No current facility-administered medications on file prior to visit.    Allergies  Allergen Reactions  . Crestor [Rosuvastatin Calcium]     myalgia    Family History  Problem Relation Age of Onset  . Diabetes Mother   . Heart disease Mother   . Diabetes Father   . Heart disease Father   . Stroke Father   . Diabetes Sister   . Diabetes Brother   . Hypertension Other     BP 126/74  Pulse 62  Temp(Src) 97.8 F (36.6 C) (Oral)  Ht 5\' 10"  (1.778 m)  Wt 211 lb (95.709 kg)  BMI 30.28 kg/m2  SpO2 96%  Review of Systems denies hypoglycemia and weight change    Objective:   Physical Exam VITAL SIGNS:  See vs page GENERAL: no distress  Lab Results  Component Value Date   HGBA1C 8.3* 03/18/2013      Assessment & Plan:  DM: This insulin regimen was chosen from  multiple options, for its relative simplicity and low cost.  The benefits of glycemic control must be weighed against the risks of hypoglycemia.  therapy limited by noncompliance.  He needs increased rx.  i'll do the best i can, which is currently to titrate insulin based on a1c. CAD: in this setting, he should avoid hypoglycemia.

## 2013-03-19 ENCOUNTER — Telehealth: Payer: Self-pay

## 2013-03-19 NOTE — Telephone Encounter (Signed)
Wrong pt. Sorry

## 2013-03-19 NOTE — Telephone Encounter (Signed)
i only treat pt for diabetes, and he is not disabled for that reason.

## 2013-03-19 NOTE — Telephone Encounter (Signed)
Pt called requesting a work note. Pt states that he has been feeling tired and he is attributing to his thyroid. He was wondering if the note could state no heavy lifting, strenuous activity or being around paint fumes. He states that the note should cover till around February 18th.  Please advise,  Thanks!   

## 2013-06-17 ENCOUNTER — Ambulatory Visit: Payer: Medicare Other | Admitting: Endocrinology

## 2013-06-17 DIAGNOSIS — Z0289 Encounter for other administrative examinations: Secondary | ICD-10-CM

## 2013-06-25 ENCOUNTER — Other Ambulatory Visit: Payer: Self-pay | Admitting: Endocrinology

## 2013-09-25 ENCOUNTER — Other Ambulatory Visit: Payer: Self-pay | Admitting: Endocrinology

## 2013-09-26 ENCOUNTER — Other Ambulatory Visit: Payer: Self-pay | Admitting: *Deleted

## 2013-09-26 MED ORDER — INSULIN NPH ISOPHANE & REGULAR (70-30) 100 UNIT/ML ~~LOC~~ SUSP
SUBCUTANEOUS | Status: DC
Start: 2013-09-26 — End: 2013-10-01

## 2013-10-01 ENCOUNTER — Ambulatory Visit (INDEPENDENT_AMBULATORY_CARE_PROVIDER_SITE_OTHER): Payer: Medicare Other | Admitting: Endocrinology

## 2013-10-01 ENCOUNTER — Encounter: Payer: Self-pay | Admitting: Endocrinology

## 2013-10-01 VITALS — BP 128/80 | HR 56 | Temp 97.6°F | Ht 66.0 in | Wt 217.0 lb

## 2013-10-01 DIAGNOSIS — E1059 Type 1 diabetes mellitus with other circulatory complications: Secondary | ICD-10-CM

## 2013-10-01 LAB — HEMOGLOBIN A1C: HEMOGLOBIN A1C: 8.2 % — AB (ref 4.6–6.5)

## 2013-10-01 NOTE — Patient Instructions (Addendum)
blood tests are being requested for you today.  We'll contact you with results.  check your blood sugar 2 times a day.  vary the time of day when you check, between before the 3 meals, and at bedtime.  also check if you have symptoms of your blood sugar being too high or too low.  please keep a record of the readings and bring it to your next appointment here.  please call us sooner if you are having low blood sugar episodes.   If you are going to be active during the day, take just 50 units that morning.   On this type of insulin schedule, you should eat meals on a regular schedule.  If a meal is missed or significantly delayed, your blood sugar could go low. Please make a follow-up appointment in 3 months.

## 2013-10-01 NOTE — Progress Notes (Signed)
Subjective:    Patient ID: Steven Osborne, male    DOB: [birth year], 73 y.o.   MRN: [id number]  HPI Pt returns for f/u of insulin-requiring DM (dx'ed 1987, on a routine blood test; he has mild if any neuropathy of the lower extremities, but he has associated nephropathy, PAD and CAD; he has been on insulin since approx 2010; therapy has been limited by pt's need for a simple, inepensive regimen; he has never had severe hypoglycemia, pancreatitis, or DKA).  no cbg record, but states cbg's are well-controlled.  He says he has frequent mild hypoglycemia, during the day, with activity.  He takes only 100 units each morning.   Past Medical History  Diagnosis Date  . DIABETES TYPE 2 02/10/2010  . HYPERLIPIDEMIA NOS 02/10/2010  . HYPERTENSION 02/10/2010  . CAD 02/25/2010  . ARTERIOSCLEROSIS 02/10/2010  . INTERMITTENT CLAUDICATION 02/10/2010    Past Surgical History  Procedure Laterality Date  . Coronary artery bypass graft  2005    4 vessel bypass     History   Social History  . Marital Status: Divorced    Spouse Name: N/A    Number of Children: N/A  . Years of Education: N/A   Occupational History  . Not on file.   Social History Main Topics  . Smoking status: Former Games developermoker  . Smokeless tobacco: Current User    Types: Chew  . Alcohol Use: No  . Drug Use: No  . Sexual Activity: Not on file   Other Topics Concern  . Not on file   Social History Narrative   Divorced, lives alone   Regular exercise-yes    Current Outpatient Prescriptions on File Prior to Visit  Medication Sig Dispense Refill  . amLODipine (NORVASC) 5 MG tablet Take 5 mg by mouth daily.        Marland Kitchen. lisinopril (PRINIVIL,ZESTRIL) 20 MG tablet Take 20 mg by mouth daily.        . metoprolol tartrate (LOPRESSOR) 25 MG tablet Take 25 mg by mouth every morning.        Marland Kitchen. NOVOLIN 70/30 RELION (70-30) 100 UNIT/ML injection INJECT 160 UNITS INTO THE SKIN DAILY WITH BREAKFAST  5 vial  1  . omeprazole (PRILOSEC) 20 MG capsule  Take 20 mg by mouth daily.       . pravastatin (PRAVACHOL) 40 MG tablet Take 40 mg by mouth daily.        . timolol (TIMOPTIC) 0.25 % ophthalmic solution       . zolpidem (AMBIEN) 10 MG tablet Take 10 mg by mouth at bedtime as needed.         No current facility-administered medications on file prior to visit.    Allergies  Allergen Reactions  . Crestor [Rosuvastatin Calcium]     myalgia    Family History  Problem Relation Age of Onset  . Diabetes Mother   . Heart disease Mother   . Diabetes Father   . Heart disease Father   . Stroke Father   . Diabetes Sister   . Diabetes Brother   . Hypertension Other     BP 128/80  Pulse 56  Temp(Src) 97.6 F (36.4 C) (Oral)  Ht 5\' 6"  (1.676 m)  Wt 217 lb (98.431 kg)  BMI 35.04 kg/m2  SpO2 94%    Review of Systems Denies LOC and weight change.      Objective:   Physical Exam Pulses: dorsalis pedis intact bilat.   Feet: no deformity. normal color and  temp.  no edema Skin:  no ulcer on the feet.   Neuro: sensation is intact to touch on the feet    Lab Results  Component Value Date   HGBA1C 8.2* 10/01/2013      Assessment & Plan:  DM: moderate exacerbation Noncompliance with cbg recording: I'll work around this as best I can. CAD: in this context: we have to avoid hypoglycemia, so we'll have to increase insulin slowly.  Patient is advised the following: Patient Instructions  blood tests are being requested for you today.  We'll contact you with results.  check your blood sugar 2 times a day.  vary the time of day when you check, between before the 3 meals, and at bedtime.  also check if you have symptoms of your blood sugar being too high or too low.  please keep a record of the readings and bring it to your next appointment here.  please call us sooner if you are having low blood sugar episodes.   If you are going to be active during the day, take just 50 units that morning.   On this type of insulin schedule, you  should eat meals on a regular schedule.  If a meal is missed or significantly delayed, your blood sugar could go low. Please make a follow-up appointment in 3 months.    Please take 120 units each morning (but just 50 if you are going to be active)

## 2013-11-04 ENCOUNTER — Other Ambulatory Visit: Payer: Self-pay

## 2013-11-04 MED ORDER — INSULIN NPH ISOPHANE & REGULAR (70-30) 100 UNIT/ML ~~LOC~~ SUSP
SUBCUTANEOUS | Status: DC
Start: 2013-11-04 — End: 2014-01-01

## 2014-01-01 ENCOUNTER — Ambulatory Visit (INDEPENDENT_AMBULATORY_CARE_PROVIDER_SITE_OTHER): Payer: Medicare Other | Admitting: Endocrinology

## 2014-01-01 ENCOUNTER — Encounter: Payer: Self-pay | Admitting: Endocrinology

## 2014-01-01 VITALS — BP 128/88 | HR 60 | Temp 97.6°F | Ht 66.0 in | Wt 217.0 lb

## 2014-01-01 DIAGNOSIS — E1065 Type 1 diabetes mellitus with hyperglycemia: Principal | ICD-10-CM

## 2014-01-01 DIAGNOSIS — E1059 Type 1 diabetes mellitus with other circulatory complications: Secondary | ICD-10-CM

## 2014-01-01 DIAGNOSIS — IMO0002 Reserved for concepts with insufficient information to code with codable children: Secondary | ICD-10-CM

## 2014-01-01 DIAGNOSIS — E1051 Type 1 diabetes mellitus with diabetic peripheral angiopathy without gangrene: Secondary | ICD-10-CM

## 2014-01-01 LAB — LDL CHOLESTEROL, DIRECT: Direct LDL: 129.5 mg/dL

## 2014-01-01 LAB — LIPID PANEL
CHOLESTEROL: 185 mg/dL (ref 0–200)
HDL: 26.3 mg/dL — ABNORMAL LOW (ref >39.00)
NonHDL: 158.7
Total CHOL/HDL Ratio: 7
Triglycerides: 264 mg/dL — ABNORMAL HIGH (ref 0.0–149.0)
VLDL: 52.8 mg/dL — ABNORMAL HIGH (ref 0.0–40.0)

## 2014-01-01 LAB — HEMOGLOBIN A1C: HEMOGLOBIN A1C: 8.5 % — AB (ref 4.6–6.5)

## 2014-01-01 MED ORDER — INSULIN NPH ISOPHANE & REGULAR (70-30) 100 UNIT/ML ~~LOC~~ SUSP: 120.0000 [IU] | Freq: Every day | SUBCUTANEOUS | Status: DC

## 2014-01-01 NOTE — Patient Instructions (Addendum)
blood tests are being requested for you today.  We'll contact you with results.  check your blood sugar 2 times a day.  vary the time of day when you check, between before the 3 meals, and at bedtime.  also check if you have symptoms of your blood sugar being too high or too low.  please keep a record of the readings and bring it to your next appointment here.  please call us sooner if you are having low blood sugar episodes.   Please increase the insulin to 120 units each morning.   If you are going to be active during the day, take just 50 units that morning.   On this type of insulin schedule, you should eat meals on a regular schedule.  If a meal is missed or significantly delayed, your blood sugar could go low. Please make a follow-up appointment in 3 months.

## 2014-01-01 NOTE — Progress Notes (Signed)
Subjective:    Patient ID: Steven Osborne, male    DOB: 08-05-1940, 73 y.o.   MRN: [id number]  HPI  Pt returns for f/u of diabetes mellitus: DM type: Insulin-requiring type 2 Dx'ed: 1987 Complications: nephropathy, PAD and CAD Therapy: insulin since 2010 DKA: never Severe hypoglycemia: never Pancreatitis: never Other: therapy has been limited by pt's need for a simple, inepensive regimen Interval history:  no cbg record, but states cbg's are well-controlled.  He says he has mild hypoglycemia, during the day, with activity.  This happens only once ever few months.  He takes only 110 units each morning.    Past Medical History  Diagnosis Date  . DIABETES TYPE 2 02/10/2010  . HYPERLIPIDEMIA NOS 02/10/2010  . HYPERTENSION 02/10/2010  . CAD 02/25/2010  . ARTERIOSCLEROSIS 02/10/2010  . INTERMITTENT CLAUDICATION 02/10/2010    Past Surgical History  Procedure Laterality Date  . Coronary artery bypass graft  2005    4 vessel bypass     History   Social History  . Marital Status: Divorced    Spouse Name: N/A    Number of Children: N/A  . Years of Education: N/A   Occupational History  . Not on file.   Social History Main Topics  . Smoking status: Former Games developermoker  . Smokeless tobacco: Current User    Types: Chew  . Alcohol Use: No  . Drug Use: No  . Sexual Activity: Not on file   Other Topics Concern  . Not on file   Social History Narrative   Divorced, lives alone   Regular exercise-yes    Current Outpatient Prescriptions on File Prior to Visit  Medication Sig Dispense Refill  . amLODipine (NORVASC) 5 MG tablet Take 5 mg by mouth daily.      Marland Kitchen. lisinopril (PRINIVIL,ZESTRIL) 20 MG tablet Take 20 mg by mouth daily.      . metoprolol tartrate (LOPRESSOR) 25 MG tablet Take 25 mg by mouth every morning.      Marland Kitchen. omeprazole (PRILOSEC) 20 MG capsule Take 20 mg by mouth daily.     . pravastatin (PRAVACHOL) 40 MG tablet Take 40 mg by mouth daily.      . timolol (TIMOPTIC) 0.25 %  ophthalmic solution     . zolpidem (AMBIEN) 10 MG tablet Take 10 mg by mouth at bedtime as needed.       No current facility-administered medications on file prior to visit.    Allergies  Allergen Reactions  . Crestor [Rosuvastatin Calcium]     myalgia    Family History  Problem Relation Age of Onset  . Diabetes Mother   . Heart disease Mother   . Diabetes Father   . Heart disease Father   . Stroke Father   . Diabetes Sister   . Diabetes Brother   . Hypertension Other     BP 128/88 mmHg  Pulse 60  Temp(Src) 97.6 F (36.4 C) (Oral)  Ht 5\' 6"  (1.676 m)  Wt 217 lb (98.431 kg)  BMI 35.04 kg/m2  SpO2 96%    Review of Systems Denies LOC and weight change.     Objective:   Physical Exam VITAL SIGNS:  See vs page GENERAL: no distress Pulses: dorsalis pedis intact bilat.   Feet: no deformity.  no edema Skin:  no ulcer on the feet.  normal color and temp. Neuro: sensation is intact to touch on the feet   Lab Results  Component Value Date   HGBA1C 8.5* 01/01/2014  Lab Results  Component Value Date   CHOL 185 01/01/2014   HDL 26.30* 01/01/2014   LDLDIRECT 129.5 01/01/2014   TRIG 264.0* 01/01/2014   CHOLHDL 7 01/01/2014        Assessment & Plan:  Dyslipidemia: mild exacerbation DM: worse Noncompliance with cbg recording and insulin: I'll work around this as best I can  Patient is advised the following: Patient Instructions  blood tests are being requested for you today.  We'll contact you with results.  check your blood sugar 2 times a day.  vary the time of day when you check, between before the 3 meals, and at bedtime.  also check if you have symptoms of your blood sugar being too high or too low.  please keep a record of the readings and bring it to your next appointment here.  please call us sooner if you are having low blood sugar episodes.   Please increase the insulin to 120 units each morning.   If you are going to be active during the day, take  just 50 units that morning.   On this type of insulin schedule, you should eat meals on a regular schedule.  If a meal is missed or significantly delayed, your blood sugar could go low. Please make a follow-up appointment in 3 months.      Please make sure you ar taking the cholesterol pill.

## 2014-01-02 ENCOUNTER — Telehealth: Payer: Self-pay

## 2014-01-02 NOTE — Telephone Encounter (Signed)
Pt advised via result note that his cholesterol was high and to take Pravastatin as rx'd. Pt states he needs a refill on this medication. Ok to refill? Thanks!

## 2014-01-02 NOTE — Telephone Encounter (Signed)
Pt stated he would contact pharmacy to get med refilled through his PCP.

## 2014-01-02 NOTE — Telephone Encounter (Signed)
Please ask your PCP about the refill, so his name would go on it.

## 2014-02-26 ENCOUNTER — Telehealth: Payer: Self-pay

## 2014-02-26 MED ORDER — INSULIN NPH ISOPHANE & REGULAR (70-30) 100 UNIT/ML ~~LOC~~ SUSP: SUBCUTANEOUS | Status: DC

## 2014-02-26 NOTE — Telephone Encounter (Signed)
ok 

## 2014-02-26 NOTE — Telephone Encounter (Signed)
Rx sent to pharmacy   

## 2014-02-26 NOTE — Telephone Encounter (Signed)
Received a notice from the pt's insurance. Novolin 70/30 is no longer covered by insurance. Humulin 70/30 is covered. Ok to change? Thanks!

## 2014-03-21 ENCOUNTER — Telehealth: Payer: Self-pay

## 2014-03-21 MED ORDER — INSULIN NPH ISOPHANE & REGULAR (70-30) 100 UNIT/ML ~~LOC~~ SUSP: SUBCUTANEOUS | Status: DC

## 2014-03-21 NOTE — Telephone Encounter (Signed)
Rx sent to pharmacy   

## 2014-03-21 NOTE — Telephone Encounter (Signed)
ok 

## 2014-03-21 NOTE — Telephone Encounter (Addendum)
Pt dropped off a letter stating his Novolin 70/30 is no longer covered by his insurance. Covered alternative is Humulin 70/30.  Ok to change? Thanks!

## 2014-04-04 ENCOUNTER — Encounter: Payer: Self-pay | Admitting: Endocrinology

## 2014-04-04 ENCOUNTER — Telehealth: Payer: Self-pay | Admitting: Endocrinology

## 2014-04-04 ENCOUNTER — Ambulatory Visit (INDEPENDENT_AMBULATORY_CARE_PROVIDER_SITE_OTHER): Payer: 59 | Admitting: Endocrinology

## 2014-04-04 VITALS — BP 136/74 | HR 60 | Temp 98.2°F | Ht 66.0 in | Wt 219.0 lb

## 2014-04-04 DIAGNOSIS — E1051 Type 1 diabetes mellitus with diabetic peripheral angiopathy without gangrene: Secondary | ICD-10-CM

## 2014-04-04 DIAGNOSIS — E1065 Type 1 diabetes mellitus with hyperglycemia: Principal | ICD-10-CM

## 2014-04-04 DIAGNOSIS — E1059 Type 1 diabetes mellitus with other circulatory complications: Secondary | ICD-10-CM

## 2014-04-04 DIAGNOSIS — IMO0002 Reserved for concepts with insufficient information to code with codable children: Secondary | ICD-10-CM

## 2014-04-04 LAB — BASIC METABOLIC PANEL
BUN: 18 mg/dL (ref 6–23)
CO2: 29 mEq/L (ref 19–32)
CREATININE: 1.22 mg/dL (ref 0.40–1.50)
Calcium: 9.3 mg/dL (ref 8.4–10.5)
Chloride: 108 mEq/L (ref 96–112)
GFR: 61.9 mL/min (ref >60.00)
Glucose, Bld: 37 mg/dL — CL (ref 70–99)
Potassium: 3.9 mEq/L (ref 3.5–5.1)
SODIUM: 140 meq/L (ref 135–145)

## 2014-04-04 LAB — MICROALBUMIN / CREATININE URINE RATIO
Creatinine,U: 178 mg/dL
MICROALB UR: 146.1 mg/dL — AB (ref 0.0–1.9)
Microalb Creat Ratio: 82.1 mg/g — ABNORMAL HIGH (ref 0.0–30.0)

## 2014-04-04 LAB — HEMOGLOBIN A1C: Hgb A1c MFr Bld: 8.9 % — ABNORMAL HIGH (ref 4.6–6.5)

## 2014-04-04 NOTE — Patient Instructions (Addendum)
blood tests are being requested for you today.  We'll contact you with results.  check your blood sugar 2 times a day.  vary the time of day when you check, between before the 3 meals, and at bedtime.  also check if you have symptoms of your blood sugar being too high or too low.  please keep a record of the readings and bring it to your next appointment here.  please call us sooner if you are having low blood sugar episodes.    If you are going to be active during the day, take just 50 units that morning.   On this type of insulin schedule, you should eat meals on a regular schedule.  If a meal is missed or significantly delayed, your blood sugar could go low. Please make a follow-up appointment in 3 months.

## 2014-04-04 NOTE — Telephone Encounter (Signed)
Hope from [facility] called patient  Glucose 37 collected at 2:15 pm

## 2014-04-04 NOTE — Progress Notes (Signed)
Subjective:    Patient ID: Steven Osborne, male    DOB: Feb 10, 1941, 74 y.o.   MRN: [id number]  HPI Pt returns for f/u of diabetes mellitus: DM type: Insulin-requiring type 2 Dx'ed: 1987 Complications: nephropathy, PAD, and CAD. Therapy: insulin since 2010 DKA: never Severe hypoglycemia: never Pancreatitis: never Other: therapy has been limited by pt's need for a simple, inexpensive regimen Interval history: pt states he feels well in general.  no cbg record, but states cbg's are well-controlled.  There is no trend throughout the day.  He says he never misses the insulin.   Past Medical History  Diagnosis Date  . DIABETES TYPE 2 02/10/2010  . HYPERLIPIDEMIA NOS 02/10/2010  . HYPERTENSION 02/10/2010  . CAD 02/25/2010  . ARTERIOSCLEROSIS 02/10/2010  . INTERMITTENT CLAUDICATION 02/10/2010    Past Surgical History  Procedure Laterality Date  . Coronary artery bypass graft  2005    4 vessel bypass     History   Social History  . Marital Status: Divorced    Spouse Name: N/A  . Number of Children: N/A  . Years of Education: N/A   Occupational History  . Not on file.   Social History Main Topics  . Smoking status: Former Games developermoker  . Smokeless tobacco: Current User    Types: Chew  . Alcohol Use: No  . Drug Use: No  . Sexual Activity: Not on file   Other Topics Concern  . Not on file   Social History Narrative   Divorced, lives alone   Regular exercise-yes    Current Outpatient Prescriptions on File Prior to Visit  Medication Sig Dispense Refill  . amLODipine (NORVASC) 5 MG tablet Take 5 mg by mouth daily.      . insulin NPH-regular Human (HUMULIN 70/30) (70-30) 100 UNIT/ML injection Inject 120 units with breakfast 40 mL 2  . lisinopril (PRINIVIL,ZESTRIL) 20 MG tablet Take 20 mg by mouth daily.      . metoprolol tartrate (LOPRESSOR) 25 MG tablet Take 25 mg by mouth every morning.      Marland Kitchen. omeprazole (PRILOSEC) 20 MG capsule Take 20 mg by mouth daily.     . pravastatin  (PRAVACHOL) 40 MG tablet Take 40 mg by mouth daily.      . timolol (TIMOPTIC) 0.25 % ophthalmic solution     . zolpidem (AMBIEN) 10 MG tablet Take 10 mg by mouth at bedtime as needed.       No current facility-administered medications on file prior to visit.    Allergies  Allergen Reactions  . Crestor [Rosuvastatin Calcium]     myalgia    Family History  Problem Relation Age of Onset  . Diabetes Mother   . Heart disease Mother   . Diabetes Father   . Heart disease Father   . Stroke Father   . Diabetes Sister   . Diabetes Brother   . Hypertension Other     BP 136/74 mmHg  Pulse 60  Temp(Src) 98.2 F (36.8 C) (Oral)  Ht 5\' 6"  (1.676 m)  Wt 219 lb (99.338 kg)  BMI 35.36 kg/m2  SpO2 92%  Review of Systems He denies hypoglycemia and weight change.      Objective:   Physical Exam VITAL SIGNS:  See vs page.   GENERAL: no distress. Pulses: dorsalis pedis intact bilat.   MSK: no deformity of the feet. CV: no leg edema.   Skin:  no ulcer on the feet.  normal color and temp on the feet. Neuro:  sensation is intact to touch on the feet.    Lab Results  Component Value Date   HGBA1C 8.9* 04/04/2014   Lab Results  Component Value Date   CREATININE 1.22 04/04/2014   BUN 18 04/04/2014   NA 140 04/04/2014   K 3.9 04/04/2014   CL 108 04/04/2014   CO2 29 04/04/2014   Glucose=37    Assessment & Plan:  DM: moderate exacerbation.  Side-effect of rx: hypoglycemia.  This precludes increasing insulin   Patient is advised the following: Patient Instructions  blood tests are being requested for you today.  We'll contact you with results.  check your blood sugar 2 times a day.  vary the time of day when you check, between before the 3 meals, and at bedtime.  also check if you have symptoms of your blood sugar being too high or too low.  please keep a record of the readings and bring it to your next appointment here.  please call us sooner if you are having low blood sugar  episodes.    If you are going to be active during the day, take just 50 units that morning.   On this type of insulin schedule, you should eat meals on a regular schedule.  If a meal is missed or significantly delayed, your blood sugar could go low. Please make a follow-up appointment in 3 months.     we can't increase insulin now, as your sugar was low at the time of the blood test.

## 2014-04-11 ENCOUNTER — Telehealth: Payer: Self-pay | Admitting: Endocrinology

## 2014-04-11 NOTE — Telephone Encounter (Signed)
Pt advised of recent lab work.  

## 2014-04-11 NOTE — Telephone Encounter (Signed)
Patient stated that Dr Everardo AllEllison called him, he returning his call.

## 2014-07-04 ENCOUNTER — Ambulatory Visit (INDEPENDENT_AMBULATORY_CARE_PROVIDER_SITE_OTHER): Payer: 59 | Admitting: Endocrinology

## 2014-07-04 ENCOUNTER — Encounter: Payer: Self-pay | Admitting: Endocrinology

## 2014-07-04 VITALS — BP 132/74 | HR 57 | Temp 98.3°F | Wt 218.0 lb

## 2014-07-04 DIAGNOSIS — E1051 Type 1 diabetes mellitus with diabetic peripheral angiopathy without gangrene: Secondary | ICD-10-CM

## 2014-07-04 DIAGNOSIS — E1059 Type 1 diabetes mellitus with other circulatory complications: Secondary | ICD-10-CM | POA: Diagnosis not present

## 2014-07-04 DIAGNOSIS — E1065 Type 1 diabetes mellitus with hyperglycemia: Principal | ICD-10-CM

## 2014-07-04 DIAGNOSIS — IMO0002 Reserved for concepts with insufficient information to code with codable children: Secondary | ICD-10-CM

## 2014-07-04 LAB — HEMOGLOBIN A1C: Hgb A1c MFr Bld: 8.4 % — ABNORMAL HIGH (ref 4.6–6.5)

## 2014-07-04 NOTE — Progress Notes (Signed)
Subjective:    Patient ID: Steven Osborne, male    DOB: [birth year], 74 y.o.   MRN: [id number]  HPI Pt returns for f/u of diabetes mellitus: DM type: Insulin-requiring type 2 Dx'ed: 1987 Complications: nephropathy, PAD, and CAD.  Therapy: insulin since 2010 DKA: never Severe hypoglycemia: never Pancreatitis: never Other: therapy has been limited by pt's need for a simple, inexpensive regimen.   Interval history: He has not recently been reducing the insulin for activity.  He seldom checks cbg's, and cannot tell details of how cbg's are.   Past Medical History  Diagnosis Date  . DIABETES TYPE 2 02/10/2010  . HYPERLIPIDEMIA NOS 02/10/2010  . HYPERTENSION 02/10/2010  . CAD 02/25/2010  . ARTERIOSCLEROSIS 02/10/2010  . INTERMITTENT CLAUDICATION 02/10/2010    Past Surgical History  Procedure Laterality Date  . Coronary artery bypass graft  2005    4 vessel bypass     History   Social History  . Marital Status: Divorced    Spouse Name: N/A  . Number of Children: N/A  . Years of Education: N/A   Occupational History  . Not on file.   Social History Main Topics  . Smoking status: Former Games developermoker  . Smokeless tobacco: Current User    Types: Chew  . Alcohol Use: No  . Drug Use: No  . Sexual Activity: Not on file   Other Topics Concern  . Not on file   Social History Narrative   Divorced, lives alone   Regular exercise-yes    Current Outpatient Prescriptions on File Prior to Visit  Medication Sig Dispense Refill  . amLODipine (NORVASC) 5 MG tablet Take 5 mg by mouth daily.      . insulin NPH-regular Human (HUMULIN 70/30) (70-30) 100 UNIT/ML injection Inject 120 units with breakfast 40 mL 2  . lisinopril (PRINIVIL,ZESTRIL) 20 MG tablet Take 20 mg by mouth daily.      . metoprolol tartrate (LOPRESSOR) 25 MG tablet Take 25 mg by mouth every morning.      Marland Kitchen. omeprazole (PRILOSEC) 20 MG capsule Take 20 mg by mouth daily.     . pravastatin (PRAVACHOL) 40 MG tablet Take 40 mg  by mouth daily.      . timolol (TIMOPTIC) 0.25 % ophthalmic solution     . zolpidem (AMBIEN) 10 MG tablet Take 10 mg by mouth at bedtime as needed.       No current facility-administered medications on file prior to visit.    Allergies  Allergen Reactions  . Crestor [Rosuvastatin Calcium]     myalgia    Family History  Problem Relation Age of Onset  . Diabetes Mother   . Heart disease Mother   . Diabetes Father   . Heart disease Father   . Stroke Father   . Diabetes Sister   . Diabetes Brother   . Hypertension Other     BP 132/74 mmHg  Pulse 57  Temp(Src) 98.3 F (36.8 C) (Oral)  Wt 218 lb (98.884 kg)  SpO2 93%   Review of Systems He denies hypoglycemia and weight change.     Objective:   Physical Exam VITAL SIGNS:  See vs page.  GENERAL: no distress. Pulses: dorsalis pedis intact bilat.   MSK: no deformity of the feet. CV: no leg edema. Skin:  no ulcer on the feet.  normal color and temp on the feet. Neuro: sensation is intact to touch on the feet.    Lab Results  Component Value Date  HGBA1C 8.4* 07/04/2014      Assessment & Plan:  DM: he needs increased rx, if it can be done with a regimen that avoids or minimizes hypoglycemia.The next step is to write down how the blood sugar is doing. Noncompliance with cbg recording, persistent: this is limiting our ability to improve glycemic control.   Patient is advised the following: Patient Instructions  blood tests are being requested for you today.  We'll contact you with results.   check your blood sugar 2 times a day.  vary the time of day when you check, between before the 3 meals, and at bedtime.  also check if you have symptoms of your blood sugar being too high or too low.  please keep a record of the readings and bring it to your next appointment here.  It is really important to check, because we can't safely increase insulin until then.  please call us sooner if you are having low blood sugar episodes.   Here is a book to write the blood sugar in.  On this type of insulin schedule, you should eat meals on a regular schedule (especially lunch).  If a meal is missed or significantly delayed, your blood sugar could go low.   Please make a follow-up appointment in 3 months.

## 2014-07-04 NOTE — Patient Instructions (Addendum)
blood tests are being requested for you today.  We'll contact you with results.   check your blood sugar 2 times a day.  vary the time of day when you check, between before the 3 meals, and at bedtime.  also check if you have symptoms of your blood sugar being too high or too low.  please keep a record of the readings and bring it to your next appointment here.  It is really important to check, because we can't safely increase insulin until then.  please call us sooner if you are having low blood sugar episodes.  Here is a book to write the blood sugar in.  On this type of insulin schedule, you should eat meals on a regular schedule (especially lunch).  If a meal is missed or significantly delayed, your blood sugar could go low.   Please make a follow-up appointment in 3 months.

## 2014-09-09 ENCOUNTER — Encounter: Payer: Self-pay | Admitting: Endocrinology

## 2014-09-09 LAB — HM DIABETES EYE EXAM

## 2014-10-06 ENCOUNTER — Ambulatory Visit (INDEPENDENT_AMBULATORY_CARE_PROVIDER_SITE_OTHER): Payer: 59 | Admitting: Endocrinology

## 2014-10-06 ENCOUNTER — Encounter: Payer: Self-pay | Admitting: Endocrinology

## 2014-10-06 VITALS — BP 127/80 | HR 64 | Ht 66.0 in | Wt 210.0 lb

## 2014-10-06 DIAGNOSIS — E1059 Type 1 diabetes mellitus with other circulatory complications: Secondary | ICD-10-CM

## 2014-10-06 DIAGNOSIS — E1065 Type 1 diabetes mellitus with hyperglycemia: Principal | ICD-10-CM

## 2014-10-06 DIAGNOSIS — IMO0002 Reserved for concepts with insufficient information to code with codable children: Secondary | ICD-10-CM

## 2014-10-06 DIAGNOSIS — E1051 Type 1 diabetes mellitus with diabetic peripheral angiopathy without gangrene: Secondary | ICD-10-CM

## 2014-10-06 LAB — POCT GLYCOSYLATED HEMOGLOBIN (HGB A1C): Hemoglobin A1C: 7.2

## 2014-10-06 MED ORDER — INSULIN NPH ISOPHANE & REGULAR (70-30) 100 UNIT/ML ~~LOC~~ SUSP: 110.0000 [IU] | Freq: Every day | SUBCUTANEOUS | Status: DC

## 2014-10-06 NOTE — Progress Notes (Signed)
Subjective:    Patient ID: Steven Osborne, male    DOB: 24-Jan-1941, 74 y.o.   MRN: [id number]  HPI Pt returns for f/u of diabetes mellitus: DM type: Insulin-requiring type 2 Dx'ed: 1987 Complications: nephropathy, PAD, and CAD.  Therapy: insulin since 2010 DKA: never Severe hypoglycemia: never Pancreatitis: never Other: therapy has been limited by pt's need for a simple, inexpensive regimen.   Interval history: He has recently been doing better with reducing the insulin for activity.  He seldom checks cbg's, but says it varies from 100-200.  Since last ov, he has had only 1 episode of hypoglycemia.  On that day, he took the full 120 units that am, but missed lunch.  Past Medical History  Diagnosis Date  . DIABETES TYPE 2 02/10/2010  . HYPERLIPIDEMIA NOS 02/10/2010  . HYPERTENSION 02/10/2010  . CAD 02/25/2010  . ARTERIOSCLEROSIS 02/10/2010  . INTERMITTENT CLAUDICATION 02/10/2010    Past Surgical History  Procedure Laterality Date  . Coronary artery bypass graft  2005    4 vessel bypass     Social History   Social History  . Marital Status: Divorced    Spouse Name: N/A  . Number of Children: N/A  . Years of Education: N/A   Occupational History  . Not on file.   Social History Main Topics  . Smoking status: Former Games developer  . Smokeless tobacco: Current User    Types: Chew  . Alcohol Use: No  . Drug Use: No  . Sexual Activity: Not on file   Other Topics Concern  . Not on file   Social History Narrative   Divorced, lives alone   Regular exercise-yes    Current Outpatient Prescriptions on File Prior to Visit  Medication Sig Dispense Refill  . amLODipine (NORVASC) 5 MG tablet Take 5 mg by mouth daily.      Marland Kitchen lisinopril (PRINIVIL,ZESTRIL) 20 MG tablet Take 20 mg by mouth daily.      . metoprolol tartrate (LOPRESSOR) 25 MG tablet Take 25 mg by mouth every morning.      Marland Kitchen omeprazole (PRILOSEC) 20 MG capsule Take 20 mg by mouth daily.     . pravastatin (PRAVACHOL) 40  MG tablet Take 40 mg by mouth daily.      . timolol (TIMOPTIC) 0.25 % ophthalmic solution     . zolpidem (AMBIEN) 10 MG tablet Take 10 mg by mouth at bedtime as needed.       No current facility-administered medications on file prior to visit.    Allergies  Allergen Reactions  . Crestor [Rosuvastatin Calcium]     myalgia    Family History  Problem Relation Age of Onset  . Diabetes Mother   . Heart disease Mother   . Diabetes Father   . Heart disease Father   . Stroke Father   . Diabetes Sister   . Diabetes Brother   . Hypertension Other     BP 127/80 mmHg  Pulse 64  Ht  (1.676 m)  Wt 210 lb (95.255 kg)  BMI 33.91 kg/m2  SpO2 97%  Review of Systems Denies LOC    Objective:   Physical Exam VITAL SIGNS:  See vs page GENERAL: no distress Pulses: dorsalis pedis intact bilat.   MSK: no deformity of the feet CV: no leg edema Skin:  no ulcer on the feet.  normal color and temp on the feet. Neuro: sensation is intact to touch on the feet   A1c=7.2%    Assessment &  Plan:  DM: this is the best control this pt should aim for, given this regimen, which does match insulin to his changing needs throughout the day  Patient is advised the following: Patient Instructions  check your blood sugar 2 times a day.  vary the time of day when you check, between before the 3 meals, and at bedtime.  also check if you have symptoms of your blood sugar being too high or too low.  please keep a record of the readings and bring it to your next appointment here.    On this type of insulin schedule, you should eat meals on a regular schedule (especially lunch).  If a meal is missed or significantly delayed, your blood sugar could go low.   Please take 110 units each morning.  If you are going to be active, take just 80 units.   Please make a follow-up appointment in 3-4 months.

## 2014-10-06 NOTE — Patient Instructions (Addendum)
check your blood sugar 2 times a day.  vary the time of day when you check, between before the 3 meals, and at bedtime.  also check if you have symptoms of your blood sugar being too high or too low.  please keep a record of the readings and bring it to your next appointment here.    On this type of insulin schedule, you should eat meals on a regular schedule (especially lunch).  If a meal is missed or significantly delayed, your blood sugar could go low.   Please take 110 units each morning.  If you are going to be active, take just 80 units.   Please make a follow-up appointment in 3-4 months.

## 2014-10-09 ENCOUNTER — Telehealth: Payer: Self-pay | Admitting: Endocrinology

## 2014-10-09 MED ORDER — INSULIN NPH ISOPHANE & REGULAR (70-30) 100 UNIT/ML ~~LOC~~ SUSP: 110.0000 [IU] | Freq: Every day | SUBCUTANEOUS | Status: DC

## 2014-10-09 NOTE — Telephone Encounter (Signed)
Rx submitted to [facility]. Will wait and see if PA is required.

## 2014-10-09 NOTE — Telephone Encounter (Signed)
Patient called stating that he would like a refill on his insulin   Rx: Humulin  Last year had to call the insurance and get auth for this Rx  Thank you

## 2014-10-13 ENCOUNTER — Telehealth: Payer: Self-pay | Admitting: Endocrinology

## 2014-10-13 NOTE — Telephone Encounter (Signed)
See note below to be advise. Can we submit a PA for the Novolin 70/30?

## 2014-10-13 NOTE — Telephone Encounter (Signed)
I contacted the pt's wife advised the pharmacy stated the pt is in the doughnut hole and his insulin has risen in price due to that. She stated she will advise the pt.

## 2014-10-13 NOTE — Telephone Encounter (Signed)
I contacted the pt's wife advised the pharmacy stated the pt is in the doughnut hole and his insulin has risen in price due to that. She stated she will advise the pt.  

## 2014-10-13 NOTE — Telephone Encounter (Signed)
We can do PA, but what is alternative?

## 2014-10-13 NOTE — Telephone Encounter (Signed)
Pt calling to see what we sent to the insurance company for 70/30 insulin, have we requested another PA, he is not able to get his meds.

## 2014-10-13 NOTE — Telephone Encounter (Signed)
Patient calling for his medication 70/30 insulin, have we requested another PA, he is not able to get his meds, please advise today

## 2015-01-07 DIAGNOSIS — E119 Type 2 diabetes mellitus without complications: Secondary | ICD-10-CM | POA: Insufficient documentation

## 2015-01-07 DIAGNOSIS — Z794 Long term (current) use of insulin: Secondary | ICD-10-CM | POA: Insufficient documentation

## 2015-01-14 ENCOUNTER — Other Ambulatory Visit: Payer: Self-pay | Admitting: Endocrinology

## 2015-01-22 DIAGNOSIS — Z794 Long term (current) use of insulin: Secondary | ICD-10-CM | POA: Insufficient documentation

## 2015-01-22 DIAGNOSIS — M1990 Unspecified osteoarthritis, unspecified site: Secondary | ICD-10-CM | POA: Insufficient documentation

## 2015-01-22 DIAGNOSIS — E0800 Diabetes mellitus due to underlying condition with hyperosmolarity without nonketotic hyperglycemic-hyperosmolar coma (NKHHC): Secondary | ICD-10-CM | POA: Insufficient documentation

## 2015-02-06 ENCOUNTER — Ambulatory Visit: Payer: 59 | Admitting: Endocrinology

## 2015-02-06 ENCOUNTER — Encounter: Payer: Self-pay | Admitting: Endocrinology

## 2015-02-06 ENCOUNTER — Other Ambulatory Visit: Payer: 59

## 2015-02-06 ENCOUNTER — Ambulatory Visit (INDEPENDENT_AMBULATORY_CARE_PROVIDER_SITE_OTHER): Payer: 59 | Admitting: Endocrinology

## 2015-02-06 VITALS — BP 126/78 | HR 55 | Temp 97.9°F | Ht 66.0 in | Wt 212.0 lb

## 2015-02-06 DIAGNOSIS — IMO0002 Reserved for concepts with insufficient information to code with codable children: Secondary | ICD-10-CM

## 2015-02-06 DIAGNOSIS — E1065 Type 1 diabetes mellitus with hyperglycemia: Secondary | ICD-10-CM

## 2015-02-06 DIAGNOSIS — E1051 Type 1 diabetes mellitus with diabetic peripheral angiopathy without gangrene: Secondary | ICD-10-CM

## 2015-02-06 LAB — POCT GLYCOSYLATED HEMOGLOBIN (HGB A1C): HEMOGLOBIN A1C: 7.9

## 2015-02-06 MED ORDER — INSULIN NPH ISOPHANE & REGULAR (70-30) 100 UNIT/ML ~~LOC~~ SUSP: 110.0000 [IU] | Freq: Every day | SUBCUTANEOUS | Status: DC

## 2015-02-06 NOTE — Patient Instructions (Signed)
check your blood sugar 2 times a day.  vary the time of day when you check, between before the 3 meals, and at bedtime.  also check if you have symptoms of your blood sugar being too high or too low.  please keep a record of the readings and bring it to your next appointment here.    On this type of insulin schedule, you should eat meals on a regular schedule (especially lunch).  If a meal is missed or significantly delayed, your blood sugar could go low.   Please continue 110 units each morning.  If you are going to be active, take just 80 units.   Please make a follow-up appointment in 3-4 months.

## 2015-02-06 NOTE — Progress Notes (Signed)
Subjective:    Patient ID: Steven Osborne, male    DOB: 11-04-1940, 74 y.o.   MRN: [id number]  HPI Pt returns for f/u of diabetes mellitus: DM type: Insulin-requiring type 2 Dx'ed: 1987 Complications: nephropathy, PAD, and CAD.  Therapy: insulin since 2010 DKA: never Severe hypoglycemia: never Pancreatitis: never Other: therapy has been limited by pt's need for a simple, inexpensive regimen.   Interval history: He had MI since last ov here.  no cbg record, but states cbg's are well-controlled.  pt states he feels better in general since the MI. Past Medical History  Diagnosis Date  . DIABETES TYPE 2 02/10/2010  . HYPERLIPIDEMIA NOS 02/10/2010  . HYPERTENSION 02/10/2010  . CAD 02/25/2010  . ARTERIOSCLEROSIS 02/10/2010  . INTERMITTENT CLAUDICATION 02/10/2010    Past Surgical History  Procedure Laterality Date  . Coronary artery bypass graft  2005    4 vessel bypass     Social History   Social History  . Marital Status: Divorced    Spouse Name: N/A  . Number of Children: N/A  . Years of Education: N/A   Occupational History  . Not on file.   Social History Main Topics  . Smoking status: Former Games developermoker  . Smokeless tobacco: Current User    Types: Chew  . Alcohol Use: No  . Drug Use: No  . Sexual Activity: Not on file   Other Topics Concern  . Not on file   Social History Narrative   Divorced, lives alone   Regular exercise-yes    Current Outpatient Prescriptions on File Prior to Visit  Medication Sig Dispense Refill  . amLODipine (NORVASC) 5 MG tablet Take 5 mg by mouth daily.      Marland Kitchen. lisinopril (PRINIVIL,ZESTRIL) 20 MG tablet Take 20 mg by mouth daily.      . metoprolol tartrate (LOPRESSOR) 25 MG tablet Take 25 mg by mouth every morning.      Marland Kitchen. omeprazole (PRILOSEC) 20 MG capsule Take 20 mg by mouth daily.     . pravastatin (PRAVACHOL) 40 MG tablet Take 40 mg by mouth daily.      . timolol (TIMOPTIC) 0.25 % ophthalmic solution     . zolpidem (AMBIEN) 10 MG  tablet Take 10 mg by mouth at bedtime as needed.       No current facility-administered medications on file prior to visit.    Allergies  Allergen Reactions  . Crestor [Rosuvastatin Calcium]     myalgia    Family History  Problem Relation Age of Onset  . Diabetes Mother   . Heart disease Mother   . Diabetes Father   . Heart disease Father   . Stroke Father   . Diabetes Sister   . Diabetes Brother   . Hypertension Other     BP 126/78 mmHg  Pulse 55  Temp(Src) 97.9 F (36.6 C) (Oral)  Ht 5\' 6"  (1.676 m)  Wt 212 lb (96.163 kg)  BMI 34.23 kg/m2  SpO2 97%  Review of Systems He denies hypoglycemia    Objective:   Physical Exam VITAL SIGNS:  See vs page GENERAL: no distress Pulses: dorsalis pedis intact bilat.   MSK: no deformity of the feet CV: no leg edema Skin:  no ulcer on the feet.  normal color and temp on the feet. Neuro: sensation is intact to touch on the feet  A1c=7.4%     Assessment & Plan:  DM: this is the best control this pt should aim for, given this  regimen, which does match insulin to her changing needs throughout the day.  Patient is advised the following: Patient Instructions  check your blood sugar 2 times a day.  vary the time of day when you check, between before the 3 meals, and at bedtime.  also check if you have symptoms of your blood sugar being too high or too low.  please keep a record of the readings and bring it to your next appointment here.    On this type of insulin schedule, you should eat meals on a regular schedule (especially lunch).  If a meal is missed or significantly delayed, your blood sugar could go low.   Please continue 110 units each morning.  If you are going to be active, take just 80 units.   Please make a follow-up appointment in 3-4 months.

## 2015-04-08 ENCOUNTER — Other Ambulatory Visit: Payer: Self-pay | Admitting: Endocrinology

## 2015-04-08 ENCOUNTER — Telehealth: Payer: Self-pay | Admitting: Endocrinology

## 2015-04-08 MED ORDER — INSULIN NPH ISOPHANE & REGULAR (70-30) 100 UNIT/ML ~~LOC~~ SUSP: 110.0000 [IU] | Freq: Every day | SUBCUTANEOUS | Status: DC

## 2015-04-08 NOTE — Telephone Encounter (Signed)
Patient stated he need his medication today he's out, Novolin 70/30 send to  [facility] [address] [place] - [address] [phone] (Phone) [phone] (Fax)

## 2015-04-08 NOTE — Telephone Encounter (Signed)
Rx submitted per pt's request. Pt notified of refill.

## 2015-05-05 ENCOUNTER — Telehealth: Payer: Self-pay | Admitting: Endocrinology

## 2015-05-05 ENCOUNTER — Other Ambulatory Visit: Payer: Self-pay | Admitting: *Deleted

## 2015-05-05 NOTE — Telephone Encounter (Signed)
Pt want to change drug stores for his insulin he wants us to start calling it into [phone]

## 2015-05-05 NOTE — Telephone Encounter (Signed)
Pharmacy was already in there.

## 2015-06-08 ENCOUNTER — Encounter: Payer: Self-pay | Admitting: Endocrinology

## 2015-06-08 ENCOUNTER — Ambulatory Visit (INDEPENDENT_AMBULATORY_CARE_PROVIDER_SITE_OTHER): Payer: 59 | Admitting: Endocrinology

## 2015-06-08 VITALS — BP 118/70 | HR 55 | Temp 97.7°F | Wt 215.0 lb

## 2015-06-08 DIAGNOSIS — E1065 Type 1 diabetes mellitus with hyperglycemia: Secondary | ICD-10-CM | POA: Diagnosis not present

## 2015-06-08 DIAGNOSIS — E1051 Type 1 diabetes mellitus with diabetic peripheral angiopathy without gangrene: Secondary | ICD-10-CM | POA: Diagnosis not present

## 2015-06-08 DIAGNOSIS — Z794 Long term (current) use of insulin: Secondary | ICD-10-CM

## 2015-06-08 DIAGNOSIS — E1151 Type 2 diabetes mellitus with diabetic peripheral angiopathy without gangrene: Secondary | ICD-10-CM | POA: Diagnosis not present

## 2015-06-08 DIAGNOSIS — IMO0002 Reserved for concepts with insufficient information to code with codable children: Secondary | ICD-10-CM

## 2015-06-08 LAB — POCT GLYCOSYLATED HEMOGLOBIN (HGB A1C): Hemoglobin A1C: 9

## 2015-06-08 MED ORDER — INSULIN NPH ISOPHANE & REGULAR (70-30) 100 UNIT/ML ~~LOC~~ SUSP: 130.0000 [IU] | Freq: Every day | SUBCUTANEOUS | Status: DC

## 2015-06-08 NOTE — Progress Notes (Signed)
Subjective:    Patient ID: Steven Osborne, male    DOB: 1940-07-07, 75 y.o.   MRN: [id number]  HPI Pt returns for f/u of diabetes mellitus: DM type: Insulin-requiring type 2 Dx'ed: 1987 Complications: nephropathy, PAD, and CAD.  Therapy: insulin since 2010 DKA: never Severe hypoglycemia: never Pancreatitis: never Other: therapy has been limited by pt's need for a simple, inexpensive regimen.   Interval history: Pt says he never misses the insulin.  no cbg record, but states cbg's are in the 200's-300's.  pt states he feels well in general. Past Medical History  Diagnosis Date  . DIABETES TYPE 2 02/10/2010  . HYPERLIPIDEMIA NOS 02/10/2010  . HYPERTENSION 02/10/2010  . CAD 02/25/2010  . ARTERIOSCLEROSIS 02/10/2010  . INTERMITTENT CLAUDICATION 02/10/2010    Past Surgical History  Procedure Laterality Date  . Coronary artery bypass graft  2005    4 vessel bypass     Social History   Social History  . Marital Status: Divorced    Spouse Name: N/A  . Number of Children: N/A  . Years of Education: N/A   Occupational History  . Not on file.   Social History Main Topics  . Smoking status: Former Games developer  . Smokeless tobacco: Current User    Types: Chew  . Alcohol Use: No  . Drug Use: No  . Sexual Activity: Not on file   Other Topics Concern  . Not on file   Social History Narrative   Divorced, lives alone   Regular exercise-yes    Current Outpatient Prescriptions on File Prior to Visit  Medication Sig Dispense Refill  . amLODipine (NORVASC) 5 MG tablet Take 5 mg by mouth daily.      Marland Kitchen lisinopril (PRINIVIL,ZESTRIL) 20 MG tablet Take 20 mg by mouth daily.      . metoprolol tartrate (LOPRESSOR) 25 MG tablet Take 25 mg by mouth every morning.      Marland Kitchen omeprazole (PRILOSEC) 20 MG capsule Take 20 mg by mouth daily.     . pravastatin (PRAVACHOL) 40 MG tablet Take 40 mg by mouth daily.      . timolol (TIMOPTIC) 0.25 % ophthalmic solution     . zolpidem (AMBIEN) 10 MG tablet  Take 10 mg by mouth at bedtime as needed.       No current facility-administered medications on file prior to visit.    Allergies  Allergen Reactions  . Crestor [Rosuvastatin Calcium]     myalgia    Family History  Problem Relation Age of Onset  . Diabetes Mother   . Heart disease Mother   . Diabetes Father   . Heart disease Father   . Stroke Father   . Diabetes Sister   . Diabetes Brother   . Hypertension Other     BP 118/70 mmHg  Pulse 55  Temp(Src) 97.7 F (36.5 C) (Oral)  Wt 215 lb (97.523 kg)  SpO2 96%  Review of Systems He denies hypoglycemia.      Objective:   Physical Exam VITAL SIGNS:  See vs page GENERAL: no distress Pulses: dorsalis pedis intact bilat.   MSK: no deformity of the feet CV: no leg edema Skin:  no ulcer on the feet.  normal color and temp on the feet. Neuro: sensation is intact to touch on the feet   A1c=9.0%    Assessment & Plan:  DM: worse.  Patient is advised the following: Patient Instructions  check your blood sugar 2 times a day.  vary the  time of day when you check, between before the 3 meals, and at bedtime.  also check if you have symptoms of your blood sugar being too high or too low.  please keep a record of the readings and bring it to your next appointment here.    On this type of insulin schedule, you should eat meals on a regular schedule (especially lunch).  If a meal is missed or significantly delayed, your blood sugar could go low.   Please increase the insulin to 130 units each morning with breakfast.  If you are going to be active, take just 90 units.   Please make a follow-up appointment in 6 weeks.

## 2015-06-08 NOTE — Patient Instructions (Addendum)
check your blood sugar 2 times a day.  vary the time of day when you check, between before the 3 meals, and at bedtime.  also check if you have symptoms of your blood sugar being too high or too low.  please keep a record of the readings and bring it to your next appointment here.    On this type of insulin schedule, you should eat meals on a regular schedule (especially lunch).  If a meal is missed or significantly delayed, your blood sugar could go low.   Please increase the insulin to 130 units each morning with breakfast.  If you are going to be active, take just 90 units.   Please make a follow-up appointment in 6 weeks.

## 2015-06-09 DIAGNOSIS — E119 Type 2 diabetes mellitus without complications: Secondary | ICD-10-CM | POA: Insufficient documentation

## 2015-06-24 DIAGNOSIS — H9193 Unspecified hearing loss, bilateral: Secondary | ICD-10-CM | POA: Insufficient documentation

## 2015-07-21 ENCOUNTER — Ambulatory Visit (INDEPENDENT_AMBULATORY_CARE_PROVIDER_SITE_OTHER): Payer: 59 | Admitting: Endocrinology

## 2015-07-21 ENCOUNTER — Encounter: Payer: Self-pay | Admitting: Endocrinology

## 2015-07-21 VITALS — BP 128/80 | HR 56 | Temp 97.4°F | Ht 66.0 in | Wt 218.0 lb

## 2015-07-21 DIAGNOSIS — Z794 Long term (current) use of insulin: Secondary | ICD-10-CM

## 2015-07-21 DIAGNOSIS — E1151 Type 2 diabetes mellitus with diabetic peripheral angiopathy without gangrene: Secondary | ICD-10-CM

## 2015-07-21 NOTE — Progress Notes (Signed)
Subjective:    Patient ID: Steven Osborne, male    DOB: [birth year], 75 y.o.   MRN: [id number]  HPI Pt returns for f/u of diabetes mellitus: DM type: Insulin-requiring type 2 Dx'ed: 1987 Complications: nephropathy, PAD, and CAD.  Therapy: insulin since 2010 DKA: never Severe hypoglycemia: never Pancreatitis: never Other: therapy has been limited by pt's need for a QD, inexpensive regimen.   Interval history: Pt says he never misses the insulin.  no cbg record, but states cbg's are usually in the 200's.  pt states he feels well in general.  He has mild hypoglycemia if lunch is missed or delayed.  He says cbg's are the same of active (90 unit) days vs inactive (130 unit) days. Past Medical History  Diagnosis Date  . DIABETES TYPE 2 02/10/2010  . HYPERLIPIDEMIA NOS 02/10/2010  . HYPERTENSION 02/10/2010  . CAD 02/25/2010  . ARTERIOSCLEROSIS 02/10/2010  . INTERMITTENT CLAUDICATION 02/10/2010    Past Surgical History  Procedure Laterality Date  . Coronary artery bypass graft  2005    4 vessel bypass     Social History   Social History  . Marital Status: Divorced    Spouse Name: N/A  . Number of Children: N/A  . Years of Education: N/A   Occupational History  . Not on file.   Social History Main Topics  . Smoking status: Former Games developer  . Smokeless tobacco: Current User    Types: Chew  . Alcohol Use: No  . Drug Use: No  . Sexual Activity: Not on file   Other Topics Concern  . Not on file   Social History Narrative   Divorced, lives alone   Regular exercise-yes    Current Outpatient Prescriptions on File Prior to Visit  Medication Sig Dispense Refill  . amLODipine (NORVASC) 5 MG tablet Take 5 mg by mouth daily.      . insulin NPH-regular Human (HUMULIN 70/30) (70-30) 100 UNIT/ML injection Inject 130 Units into the skin daily with breakfast. 50 mL 11  . lisinopril (PRINIVIL,ZESTRIL) 20 MG tablet Take 20 mg by mouth daily.      . metoprolol tartrate (LOPRESSOR) 25 MG  tablet Take 25 mg by mouth every morning.      Marland Kitchen omeprazole (PRILOSEC) 20 MG capsule Take 20 mg by mouth daily.     . pravastatin (PRAVACHOL) 40 MG tablet Take 40 mg by mouth daily.      . timolol (TIMOPTIC) 0.25 % ophthalmic solution     . zolpidem (AMBIEN) 10 MG tablet Take 10 mg by mouth at bedtime as needed.       No current facility-administered medications on file prior to visit.    Allergies  Allergen Reactions  . Crestor [Rosuvastatin Calcium]     myalgia    Family History  Problem Relation Age of Onset  . Diabetes Mother   . Heart disease Mother   . Diabetes Father   . Heart disease Father   . Stroke Father   . Diabetes Sister   . Diabetes Brother   . Hypertension Other     BP 128/80 mmHg  Pulse 56  Temp(Src) 97.4 F (36.3 C) (Oral)  Ht  (1.676 m)  Wt 218 lb (98.884 kg)  BMI 35.20 kg/m2  SpO2 95%  Review of Systems Denies LOC.      Objective:   Physical Exam VITAL SIGNS:  See vs page GENERAL: no distress Pulses: dorsalis pedis intact bilat.   MSK: no deformity of the  feet CV: no leg edema Skin:  no ulcer on the feet.  normal color and temp on the feet. Neuro: sensation is intact to touch on the feet   Fructosamine=335     Assessment & Plan:  Insulin-requiring type 2 DM: this is the best control this pt should aim for, given this regimen, which does match insulin to his changing needs throughout the day.  Patient is advised the following: Patient Instructions  check your blood sugar 2 times a day.  vary the time of day when you check, between before the 3 meals, and at bedtime.  also check if you have symptoms of your blood sugar being too high or too low.  please keep a record of the readings and bring it to your next appointment here.    blood tests are requested for you today.  We'll let you know about the results. On this type of insulin schedule, you should eat meals on a regular schedule (especially lunch).  If a meal is missed or  significantly delayed, your blood sugar could go low.   Please continue 130 units each morning with breakfast.  If you are going to be active, take just 90 units.   Please make a follow-up appointment in 3 months.     Romero BellingELLISON, Lenka Zhao, MD

## 2015-07-21 NOTE — Patient Instructions (Addendum)
check your blood sugar 2 times a day.  vary the time of day when you check, between before the 3 meals, and at bedtime.  also check if you have symptoms of your blood sugar being too high or too low.  please keep a record of the readings and bring it to your next appointment here.    blood tests are requested for you today.  We'll let you know about the results. On this type of insulin schedule, you should eat meals on a regular schedule (especially lunch).  If a meal is missed or significantly delayed, your blood sugar could go low.   Please continue 130 units each morning with breakfast.  If you are going to be active, take just 90 units.   Please make a follow-up appointment in 3 months.

## 2015-07-22 LAB — FRUCTOSAMINE: Fructosamine: 335 umol/L — ABNORMAL HIGH (ref 0–285)

## 2015-10-21 ENCOUNTER — Ambulatory Visit (INDEPENDENT_AMBULATORY_CARE_PROVIDER_SITE_OTHER): Payer: 59 | Admitting: Endocrinology

## 2015-10-21 ENCOUNTER — Encounter: Payer: Self-pay | Admitting: Endocrinology

## 2015-10-21 VITALS — BP 142/82 | HR 53 | Wt 218.0 lb

## 2015-10-21 DIAGNOSIS — Z794 Long term (current) use of insulin: Secondary | ICD-10-CM | POA: Diagnosis not present

## 2015-10-21 DIAGNOSIS — E1151 Type 2 diabetes mellitus with diabetic peripheral angiopathy without gangrene: Secondary | ICD-10-CM

## 2015-10-21 LAB — POCT GLYCOSYLATED HEMOGLOBIN (HGB A1C): Hemoglobin A1C: 9.1

## 2015-10-21 MED ORDER — INSULIN NPH ISOPHANE & REGULAR (70-30) 100 UNIT/ML ~~LOC~~ SUSP: 150.0000 [IU] | Freq: Every day | SUBCUTANEOUS | 11 refills | Status: DC

## 2015-10-21 NOTE — Patient Instructions (Addendum)
check your blood sugar 2 times a day.  vary the time of day when you check, between before the 3 meals, and at bedtime.  also check if you have symptoms of your blood sugar being too high or too low.  please keep a record of the readings and bring it to your next appointment here.    On this type of insulin schedule, you should eat meals on a regular schedule (especially lunch).  If a meal is missed or significantly delayed, your blood sugar could go low.   Please increase the insulin to 150 units each morning with breakfast.  If you are going to be active, take just 100 units.   Please make a follow-up appointment in 2 months.

## 2015-10-21 NOTE — Progress Notes (Signed)
Subjective:    Patient ID: Steven Osborne, male    DOB: 02/05/1941, 75 y.o.   MRN: [id number]  HPI Pt returns for f/u of diabetes mellitus: DM type: Insulin-requiring type 2 Dx'ed: 1987 Complications: nephropathy, PAD, and CAD (vision loss is due to glaucoma, not DM). Therapy: insulin since 2010 DKA: never Severe hypoglycemia: never Pancreatitis: never Other: therapy has been limited by pt's need for a QD, inexpensive regimen.   Interval history: Pt says he never misses the insulin.  no cbg record, but states cbg's are often in the 200's.  pt states he feels well in general.  He says cbg's are the same of active (90 unit) days vs inactive (130 unit) days.   Past Medical History:  Diagnosis Date  . ARTERIOSCLEROSIS 02/10/2010  . CAD 02/25/2010  . DIABETES TYPE 2 02/10/2010  . HYPERLIPIDEMIA NOS 02/10/2010  . HYPERTENSION 02/10/2010  . INTERMITTENT CLAUDICATION 02/10/2010    Past Surgical History:  Procedure Laterality Date  . CORONARY ARTERY BYPASS GRAFT  2005   4 vessel bypass     Social History   Social History  . Marital status: Divorced    Spouse name: N/A  . Number of children: N/A  . Years of education: N/A   Occupational History  . Not on file.   Social History Main Topics  . Smoking status: Former Games developer  . Smokeless tobacco: Current User    Types: Chew  . Alcohol use No  . Drug use: No  . Sexual activity: Not on file   Other Topics Concern  . Not on file   Social History Narrative   Divorced, lives alone   Regular exercise-yes    Current Outpatient Prescriptions on File Prior to Visit  Medication Sig Dispense Refill  . amLODipine (NORVASC) 5 MG tablet Take 5 mg by mouth daily.      Marland Kitchen lisinopril (PRINIVIL,ZESTRIL) 20 MG tablet Take 20 mg by mouth daily.      . metoprolol tartrate (LOPRESSOR) 25 MG tablet Take 25 mg by mouth every morning.      Marland Kitchen omeprazole (PRILOSEC) 20 MG capsule Take 20 mg by mouth daily.     . pravastatin (PRAVACHOL) 40 MG tablet  Take 40 mg by mouth daily.      . timolol (TIMOPTIC) 0.25 % ophthalmic solution     . zolpidem (AMBIEN) 10 MG tablet Take 10 mg by mouth at bedtime as needed.       No current facility-administered medications on file prior to visit.     Allergies  Allergen Reactions  . Crestor [Rosuvastatin Calcium]     myalgia    Family History  Problem Relation Age of Onset  . Diabetes Mother   . Heart disease Mother   . Diabetes Father   . Heart disease Father   . Stroke Father   . Diabetes Sister   . Diabetes Brother   . Hypertension Other     BP (!) 142/82 (BP Location: Right Arm, Patient Position: Sitting)   Pulse (!) 53   Wt 218 lb (98.9 kg)   SpO2 97%   BMI 35.19 kg/m    Review of Systems He denies hypoglycemia.      Objective:   Physical Exam VITAL SIGNS:  See vs page GENERAL: no distress Pulses: dorsalis pedis intact bilat.   MSK: no deformity of the feet CV: 1+ bilat leg edema Skin:  no ulcer on the feet.  normal color and temp on the feet. Neuro: sensation  is intact to touch on the feet.  Ext: There is bilateral onychomycosis of the toenails.    A1c=9.1%    Assessment & Plan:  Insulin-requiring type 2 DM: worse.  Noncompliance with cbg recording: this is limiting how quickly and safely we can increase insulin.

## 2015-12-08 ENCOUNTER — Ambulatory Visit: Payer: 59 | Admitting: Cardiology

## 2015-12-11 DIAGNOSIS — H44511 Absolute glaucoma, right eye: Secondary | ICD-10-CM | POA: Insufficient documentation

## 2015-12-21 ENCOUNTER — Encounter: Payer: Self-pay | Admitting: Endocrinology

## 2015-12-21 ENCOUNTER — Ambulatory Visit (INDEPENDENT_AMBULATORY_CARE_PROVIDER_SITE_OTHER): Payer: 59 | Admitting: Endocrinology

## 2015-12-21 VITALS — BP 120/78 | HR 56 | Wt 217.0 lb

## 2015-12-21 DIAGNOSIS — Z794 Long term (current) use of insulin: Secondary | ICD-10-CM

## 2015-12-21 DIAGNOSIS — E1151 Type 2 diabetes mellitus with diabetic peripheral angiopathy without gangrene: Secondary | ICD-10-CM | POA: Diagnosis not present

## 2015-12-21 LAB — POCT GLYCOSYLATED HEMOGLOBIN (HGB A1C): Hemoglobin A1C: 8.4

## 2015-12-21 MED ORDER — INSULIN NPH ISOPHANE & REGULAR (70-30) 100 UNIT/ML ~~LOC~~ SUSP: 160.0000 [IU] | Freq: Every day | SUBCUTANEOUS | 11 refills | Status: DC

## 2015-12-21 NOTE — Progress Notes (Signed)
Subjective:    Patient ID: Steven Osborne, male    DOB: 1940-02-24, 75 y.o.   MRN: [id number]  HPI Pt returns for f/u of diabetes mellitus: DM type: Insulin-requiring type 2 Dx'ed: 1987 Complications: nephropathy, PAD, and CAD (vision loss is due to glaucoma, not DM). Therapy: insulin since 2010 DKA: never Severe hypoglycemia: never Pancreatitis: never Other: therapy has been limited by pt's need for a QD, inexpensive regimen.   Interval history: Pt says he never misses the insulin.  pt states he feels well in general.  no cbg record, but states cbg's are in the 200's fasting, which is the only time of day he checks.  pt states he feels well in general.  He says cbg's are still the same of active (90 unit) days vs inactive (130 unit) days.   Past Medical History:  Diagnosis Date  . ARTERIOSCLEROSIS 02/10/2010  . CAD 02/25/2010  . DIABETES TYPE 2 02/10/2010  . HYPERLIPIDEMIA NOS 02/10/2010  . HYPERTENSION 02/10/2010  . INTERMITTENT CLAUDICATION 02/10/2010    Past Surgical History:  Procedure Laterality Date  . CORONARY ARTERY BYPASS GRAFT  2005   4 vessel bypass     Social History   Social History  . Marital status: Divorced    Spouse name: N/A  . Number of children: N/A  . Years of education: N/A   Occupational History  . Not on file.   Social History Main Topics  . Smoking status: Former Games developermoker  . Smokeless tobacco: Current User    Types: Chew  . Alcohol use No  . Drug use: No  . Sexual activity: Not on file   Other Topics Concern  . Not on file   Social History Narrative   Divorced, lives alone   Regular exercise-yes    Current Outpatient Prescriptions on File Prior to Visit  Medication Sig Dispense Refill  . amLODipine (NORVASC) 5 MG tablet Take 5 mg by mouth daily.      Marland Kitchen. lisinopril (PRINIVIL,ZESTRIL) 20 MG tablet Take 20 mg by mouth daily.      . metoprolol tartrate (LOPRESSOR) 25 MG tablet Take 25 mg by mouth every morning.      Marland Kitchen. omeprazole  (PRILOSEC) 20 MG capsule Take 20 mg by mouth daily.     . pravastatin (PRAVACHOL) 40 MG tablet Take 40 mg by mouth daily.      . timolol (TIMOPTIC) 0.25 % ophthalmic solution     . zolpidem (AMBIEN) 10 MG tablet Take 10 mg by mouth at bedtime as needed.       No current facility-administered medications on file prior to visit.     Allergies  Allergen Reactions  . Crestor [Rosuvastatin Calcium]     myalgia    Family History  Problem Relation Age of Onset  . Diabetes Mother   . Heart disease Mother   . Diabetes Father   . Heart disease Father   . Stroke Father   . Diabetes Sister   . Diabetes Brother   . Hypertension Other     BP 120/78 (BP Location: Left Arm, Patient Position: Sitting)   Pulse (!) 56   Wt 217 lb (98.4 kg)   SpO2 96%   BMI 35.02 kg/m   Review of Systems He denies hypoglycemia.      Objective:   Physical Exam VITAL SIGNS:  See vs page GENERAL: no distress Pulses: dorsalis pedis intact bilat.   MSK: no deformity of the feet CV: trace bilat leg edema Skin:  no ulcer on the feet.  normal color and temp on the feet.   Neuro: sensation is intact to touch on the feet.  Ext: There is bilateral onychomycosis of the toenails.     A1c=8.4%    Assessment & Plan:  Insulin-requiring type 2 DM, with PAD: he needs increased rx.   Patient is advised the following: Patient Instructions  check your blood sugar 2 times a day.  vary the time of day when you check, between before the 3 meals, and at bedtime.  also check if you have symptoms of your blood sugar being too high or too low.  please keep a record of the readings and bring it to your next appointment here.    On this type of insulin schedule, you should eat meals on a regular schedule (especially lunch).  If a meal is missed or significantly delayed, your blood sugar could go low.   Please increase the insulin to 160 units each morning with breakfast.  If you are going to be active, take just 110 units.     Please make a follow-up appointment in 3 months.

## 2015-12-21 NOTE — Patient Instructions (Addendum)
check your blood sugar 2 times a day.  vary the time of day when you check, between before the 3 meals, and at bedtime.  also check if you have symptoms of your blood sugar being too high or too low.  please keep a record of the readings and bring it to your next appointment here.    On this type of insulin schedule, you should eat meals on a regular schedule (especially lunch).  If a meal is missed or significantly delayed, your blood sugar could go low.   Please increase the insulin to 160 units each morning with breakfast.  If you are going to be active, take just 110 units.   Please make a follow-up appointment in 3 months.

## 2015-12-31 ENCOUNTER — Telehealth: Payer: Self-pay | Admitting: Endocrinology

## 2016-01-07 ENCOUNTER — Telehealth: Payer: Self-pay | Admitting: Endocrinology

## 2016-01-07 NOTE — Telephone Encounter (Signed)
ok 

## 2016-01-07 NOTE — Telephone Encounter (Signed)
Pt wanting to know if he can switch from Humulin 70/30 to Novolin 70/30 due to an insurance change coming up.

## 2016-01-07 NOTE — Telephone Encounter (Signed)
See message and please advise, Thanks!  

## 2016-01-08 NOTE — Telephone Encounter (Signed)
Requested a call back from the patient to discuss further.  

## 2016-01-11 NOTE — Telephone Encounter (Signed)
Patient is calling back on the status of insulin

## 2016-01-11 NOTE — Telephone Encounter (Signed)
I contacted the patient and requested a call back to verify if the novolin 70/30 needed to be called in at this time or if we need to wait on until the new year (2018) to send the prescription.

## 2016-01-20 NOTE — Telephone Encounter (Signed)
Patient advise me he is coming up on a insurance change this year and wanted to know if we could change his insulin once this takes place. I advised the patient the first thing we would do once his insurance changes is to come in for an appointment and to speak with Dr. Everardo AllEllison about the insulin change and then we can go from there on changing the insulin. Patient voiced understanding and had no further questions at this time.

## 2016-04-20 ENCOUNTER — Ambulatory Visit: Payer: 59 | Admitting: Endocrinology

## 2017-01-20 DIAGNOSIS — E113519 Type 2 diabetes mellitus with proliferative diabetic retinopathy with macular edema, unspecified eye: Secondary | ICD-10-CM | POA: Insufficient documentation

## 2017-01-20 DIAGNOSIS — Z794 Long term (current) use of insulin: Secondary | ICD-10-CM | POA: Insufficient documentation

## 2017-01-20 DIAGNOSIS — E113312 Type 2 diabetes mellitus with moderate nonproliferative diabetic retinopathy with macular edema, left eye: Secondary | ICD-10-CM | POA: Insufficient documentation

## 2017-01-20 DIAGNOSIS — E1139 Type 2 diabetes mellitus with other diabetic ophthalmic complication: Secondary | ICD-10-CM | POA: Insufficient documentation

## 2017-01-20 DIAGNOSIS — E11311 Type 2 diabetes mellitus with unspecified diabetic retinopathy with macular edema: Secondary | ICD-10-CM | POA: Insufficient documentation

## 2017-07-26 DIAGNOSIS — H44511 Absolute glaucoma, right eye: Secondary | ICD-10-CM | POA: Diagnosis not present

## 2017-07-27 DIAGNOSIS — E113519 Type 2 diabetes mellitus with proliferative diabetic retinopathy with macular edema, unspecified eye: Secondary | ICD-10-CM | POA: Diagnosis not present

## 2017-07-27 DIAGNOSIS — Z794 Long term (current) use of insulin: Secondary | ICD-10-CM | POA: Diagnosis not present

## 2017-07-27 DIAGNOSIS — H2512 Age-related nuclear cataract, left eye: Secondary | ICD-10-CM | POA: Diagnosis not present

## 2017-07-27 DIAGNOSIS — H44511 Absolute glaucoma, right eye: Secondary | ICD-10-CM | POA: Diagnosis not present

## 2017-07-27 DIAGNOSIS — E11311 Type 2 diabetes mellitus with unspecified diabetic retinopathy with macular edema: Secondary | ICD-10-CM | POA: Diagnosis not present

## 2017-07-27 DIAGNOSIS — E113312 Type 2 diabetes mellitus with moderate nonproliferative diabetic retinopathy with macular edema, left eye: Secondary | ICD-10-CM | POA: Diagnosis not present

## 2017-07-27 DIAGNOSIS — H211X9 Other vascular disorders of iris and ciliary body, unspecified eye: Secondary | ICD-10-CM | POA: Diagnosis not present

## 2017-07-27 DIAGNOSIS — H401414 Capsular glaucoma with pseudoexfoliation of lens, right eye, indeterminate stage: Secondary | ICD-10-CM | POA: Diagnosis not present

## 2017-07-27 DIAGNOSIS — E1139 Type 2 diabetes mellitus with other diabetic ophthalmic complication: Secondary | ICD-10-CM | POA: Diagnosis not present

## 2017-08-30 DIAGNOSIS — I10 Essential (primary) hypertension: Secondary | ICD-10-CM | POA: Diagnosis not present

## 2017-08-30 DIAGNOSIS — F329 Major depressive disorder, single episode, unspecified: Secondary | ICD-10-CM | POA: Diagnosis not present

## 2017-08-30 DIAGNOSIS — E139 Other specified diabetes mellitus without complications: Secondary | ICD-10-CM | POA: Diagnosis not present

## 2017-08-30 DIAGNOSIS — M5136 Other intervertebral disc degeneration, lumbar region: Secondary | ICD-10-CM | POA: Diagnosis not present

## 2017-08-30 DIAGNOSIS — L239 Allergic contact dermatitis, unspecified cause: Secondary | ICD-10-CM | POA: Diagnosis not present

## 2017-08-30 DIAGNOSIS — K219 Gastro-esophageal reflux disease without esophagitis: Secondary | ICD-10-CM | POA: Diagnosis not present

## 2017-08-30 DIAGNOSIS — I25810 Atherosclerosis of coronary artery bypass graft(s) without angina pectoris: Secondary | ICD-10-CM | POA: Diagnosis not present

## 2017-09-08 DIAGNOSIS — E139 Other specified diabetes mellitus without complications: Secondary | ICD-10-CM | POA: Diagnosis not present

## 2017-09-08 DIAGNOSIS — I10 Essential (primary) hypertension: Secondary | ICD-10-CM | POA: Diagnosis not present

## 2017-09-13 DIAGNOSIS — Z794 Long term (current) use of insulin: Secondary | ICD-10-CM | POA: Diagnosis not present

## 2017-09-13 DIAGNOSIS — E113312 Type 2 diabetes mellitus with moderate nonproliferative diabetic retinopathy with macular edema, left eye: Secondary | ICD-10-CM | POA: Diagnosis not present

## 2017-09-13 DIAGNOSIS — H2512 Age-related nuclear cataract, left eye: Secondary | ICD-10-CM | POA: Diagnosis not present

## 2017-09-13 DIAGNOSIS — H1830 Unspecified corneal membrane change: Secondary | ICD-10-CM | POA: Diagnosis not present

## 2017-09-13 DIAGNOSIS — H44511 Absolute glaucoma, right eye: Secondary | ICD-10-CM | POA: Diagnosis not present

## 2017-09-14 DIAGNOSIS — Z9119 Patient's noncompliance with other medical treatment and regimen: Secondary | ICD-10-CM | POA: Diagnosis not present

## 2017-09-14 DIAGNOSIS — E139 Other specified diabetes mellitus without complications: Secondary | ICD-10-CM | POA: Diagnosis not present

## 2017-09-14 DIAGNOSIS — I10 Essential (primary) hypertension: Secondary | ICD-10-CM | POA: Diagnosis not present

## 2017-09-14 DIAGNOSIS — I25810 Atherosclerosis of coronary artery bypass graft(s) without angina pectoris: Secondary | ICD-10-CM | POA: Diagnosis not present

## 2017-09-22 DIAGNOSIS — E139 Other specified diabetes mellitus without complications: Secondary | ICD-10-CM | POA: Diagnosis not present

## 2017-09-22 DIAGNOSIS — Z9119 Patient's noncompliance with other medical treatment and regimen: Secondary | ICD-10-CM | POA: Diagnosis not present

## 2017-09-22 DIAGNOSIS — I10 Essential (primary) hypertension: Secondary | ICD-10-CM | POA: Diagnosis not present

## 2017-09-24 ENCOUNTER — Emergency Department (HOSPITAL_COMMUNITY): Payer: Medicare HMO

## 2017-09-24 ENCOUNTER — Other Ambulatory Visit: Payer: Self-pay

## 2017-09-24 ENCOUNTER — Observation Stay (HOSPITAL_COMMUNITY)
Admission: EM | Admit: 2017-09-24 | Discharge: 2017-09-26 | Disposition: A | Discharge status: Home / Self Care | Payer: Medicare HMO | Attending: Internal Medicine | Admitting: Internal Medicine

## 2017-09-24 ENCOUNTER — Encounter (HOSPITAL_COMMUNITY): Payer: Self-pay | Admitting: Emergency Medicine

## 2017-09-24 DIAGNOSIS — N183 Chronic kidney disease, stage 3 (moderate): Secondary | ICD-10-CM | POA: Insufficient documentation

## 2017-09-24 DIAGNOSIS — E1121 Type 2 diabetes mellitus with diabetic nephropathy: Secondary | ICD-10-CM | POA: Diagnosis not present

## 2017-09-24 DIAGNOSIS — E785 Hyperlipidemia, unspecified: Secondary | ICD-10-CM | POA: Insufficient documentation

## 2017-09-24 DIAGNOSIS — E113592 Type 2 diabetes mellitus with proliferative diabetic retinopathy without macular edema, left eye: Secondary | ICD-10-CM | POA: Diagnosis not present

## 2017-09-24 DIAGNOSIS — Z87891 Personal history of nicotine dependence: Secondary | ICD-10-CM | POA: Insufficient documentation

## 2017-09-24 DIAGNOSIS — R2689 Other abnormalities of gait and mobility: Secondary | ICD-10-CM | POA: Diagnosis not present

## 2017-09-24 DIAGNOSIS — N19 Unspecified kidney failure: Secondary | ICD-10-CM

## 2017-09-24 DIAGNOSIS — Z7902 Long term (current) use of antithrombotics/antiplatelets: Secondary | ICD-10-CM | POA: Diagnosis not present

## 2017-09-24 DIAGNOSIS — R55 Syncope and collapse: Secondary | ICD-10-CM | POA: Diagnosis not present

## 2017-09-24 DIAGNOSIS — I951 Orthostatic hypotension: Principal | ICD-10-CM | POA: Insufficient documentation

## 2017-09-24 DIAGNOSIS — M6281 Muscle weakness (generalized): Secondary | ICD-10-CM | POA: Diagnosis not present

## 2017-09-24 DIAGNOSIS — E1122 Type 2 diabetes mellitus with diabetic chronic kidney disease: Secondary | ICD-10-CM | POA: Insufficient documentation

## 2017-09-24 DIAGNOSIS — I252 Old myocardial infarction: Secondary | ICD-10-CM | POA: Diagnosis not present

## 2017-09-24 DIAGNOSIS — Z794 Long term (current) use of insulin: Secondary | ICD-10-CM | POA: Diagnosis not present

## 2017-09-24 DIAGNOSIS — D696 Thrombocytopenia, unspecified: Secondary | ICD-10-CM | POA: Diagnosis not present

## 2017-09-24 DIAGNOSIS — Z7901 Long term (current) use of anticoagulants: Secondary | ICD-10-CM | POA: Insufficient documentation

## 2017-09-24 DIAGNOSIS — N179 Acute kidney failure, unspecified: Secondary | ICD-10-CM

## 2017-09-24 DIAGNOSIS — E113599 Type 2 diabetes mellitus with proliferative diabetic retinopathy without macular edema, unspecified eye: Secondary | ICD-10-CM

## 2017-09-24 DIAGNOSIS — I2510 Atherosclerotic heart disease of native coronary artery without angina pectoris: Secondary | ICD-10-CM | POA: Insufficient documentation

## 2017-09-24 DIAGNOSIS — I5022 Chronic systolic (congestive) heart failure: Secondary | ICD-10-CM

## 2017-09-24 DIAGNOSIS — I129 Hypertensive chronic kidney disease with stage 1 through stage 4 chronic kidney disease, or unspecified chronic kidney disease: Secondary | ICD-10-CM | POA: Insufficient documentation

## 2017-09-24 DIAGNOSIS — R2681 Unsteadiness on feet: Secondary | ICD-10-CM | POA: Diagnosis not present

## 2017-09-24 DIAGNOSIS — I255 Ischemic cardiomyopathy: Secondary | ICD-10-CM

## 2017-09-24 DIAGNOSIS — E860 Dehydration: Secondary | ICD-10-CM | POA: Insufficient documentation

## 2017-09-24 DIAGNOSIS — Z79891 Long term (current) use of opiate analgesic: Secondary | ICD-10-CM | POA: Diagnosis not present

## 2017-09-24 DIAGNOSIS — I959 Hypotension, unspecified: Secondary | ICD-10-CM | POA: Insufficient documentation

## 2017-09-24 DIAGNOSIS — R531 Weakness: Secondary | ICD-10-CM | POA: Diagnosis not present

## 2017-09-24 DIAGNOSIS — Z79899 Other long term (current) drug therapy: Secondary | ICD-10-CM | POA: Diagnosis not present

## 2017-09-24 DIAGNOSIS — Z951 Presence of aortocoronary bypass graft: Secondary | ICD-10-CM | POA: Insufficient documentation

## 2017-09-24 DIAGNOSIS — E119 Type 2 diabetes mellitus without complications: Secondary | ICD-10-CM | POA: Diagnosis not present

## 2017-09-24 DIAGNOSIS — R42 Dizziness and giddiness: Secondary | ICD-10-CM | POA: Diagnosis not present

## 2017-09-24 DIAGNOSIS — E11319 Type 2 diabetes mellitus with unspecified diabetic retinopathy without macular edema: Secondary | ICD-10-CM | POA: Diagnosis not present

## 2017-09-24 HISTORY — DX: Old myocardial infarction: I25.2

## 2017-09-24 LAB — CBC WITH DIFFERENTIAL/PLATELET
BASOS ABS: 0 10*3/uL (ref 0.0–0.1)
BASOS PCT: 1 %
EOS ABS: 0.2 10*3/uL (ref 0.0–0.7)
EOS PCT: 2 %
HCT: 41.9 % (ref 39.0–52.0)
Hemoglobin: 14 g/dL (ref 13.0–17.0)
Lymphocytes Relative: 12 %
Lymphs Abs: 1 10*3/uL (ref 0.7–4.0)
MCH: 29.4 pg (ref 26.0–34.0)
MCHC: 33.4 g/dL (ref 30.0–36.0)
MCV: 87.8 fL (ref 78.0–100.0)
Monocytes Absolute: 0.5 10*3/uL (ref 0.1–1.0)
Monocytes Relative: 6 %
Neutro Abs: 6.6 10*3/uL (ref 1.7–7.7)
Neutrophils Relative %: 79 %
PLATELETS: 117 10*3/uL — AB (ref 150–400)
RBC: 4.77 MIL/uL (ref 4.22–5.81)
RDW: 13.2 % (ref 11.5–15.5)
WBC: 8.2 10*3/uL (ref 4.0–10.5)

## 2017-09-24 LAB — APTT: APTT: 28 s (ref 24–36)

## 2017-09-24 LAB — TSH: TSH: 1.166 u[IU]/mL (ref 0.350–4.500)

## 2017-09-24 LAB — TROPONIN I
Troponin I: <0.03 ng/mL (ref <0.03)
Troponin I: <0.03 ng/mL (ref <0.03)

## 2017-09-24 LAB — COMPREHENSIVE METABOLIC PANEL
ALK PHOS: 79 U/L (ref 38–126)
ALT: 9 U/L (ref 0–44)
AST: 11 U/L — ABNORMAL LOW (ref 15–41)
Albumin: 3.7 g/dL (ref 3.5–5.0)
Anion gap: 5 (ref 5–15)
BUN: 25 mg/dL — ABNORMAL HIGH (ref 8–23)
CALCIUM: 8.5 mg/dL — AB (ref 8.9–10.3)
CHLORIDE: 104 mmol/L (ref 98–111)
CO2: 28 mmol/L (ref 22–32)
CREATININE: 1.6 mg/dL — AB (ref 0.61–1.24)
GFR calc Af Amer: 47 mL/min — ABNORMAL LOW (ref >60)
GFR, EST NON AFRICAN AMERICAN: 40 mL/min — AB (ref >60)
Glucose, Bld: 287 mg/dL — ABNORMAL HIGH (ref 70–99)
Potassium: 5 mmol/L (ref 3.5–5.1)
Sodium: 137 mmol/L (ref 135–145)
Total Bilirubin: 0.9 mg/dL (ref 0.3–1.2)
Total Protein: 6.9 g/dL (ref 6.5–8.1)

## 2017-09-24 LAB — URINALYSIS, ROUTINE W REFLEX MICROSCOPIC
BILIRUBIN URINE: NEGATIVE
Bacteria, UA: NONE SEEN
Glucose, UA: >=500 mg/dL — AB
Hgb urine dipstick: NEGATIVE
Ketones, ur: NEGATIVE mg/dL
LEUKOCYTES UA: NEGATIVE
Nitrite: NEGATIVE
PH: 5 (ref 5.0–8.0)
Protein, ur: 100 mg/dL — AB
SPECIFIC GRAVITY, URINE: 1.024 (ref 1.005–1.030)

## 2017-09-24 LAB — FIBRINOGEN: FIBRINOGEN: 364 mg/dL (ref 210–475)

## 2017-09-24 LAB — PROTIME-INR
INR: 1.09
PROTHROMBIN TIME: 14 s (ref 11.4–15.2)

## 2017-09-24 LAB — GLUCOSE, CAPILLARY
GLUCOSE-CAPILLARY: 158 mg/dL — AB (ref 70–99)
Glucose-Capillary: 182 mg/dL — ABNORMAL HIGH (ref 70–99)

## 2017-09-24 LAB — T4, FREE: Free T4: 0.81 ng/dL — ABNORMAL LOW (ref 0.82–1.77)

## 2017-09-24 LAB — LACTIC ACID, PLASMA: Lactic Acid, Venous: 0.8 mmol/L (ref 0.5–1.9)

## 2017-09-24 LAB — HEMOGLOBIN A1C
Hgb A1c MFr Bld: 7.8 % — ABNORMAL HIGH (ref 4.8–5.6)
Mean Plasma Glucose: 177.16 mg/dL

## 2017-09-24 LAB — VITAMIN B12: VITAMIN B 12: 2058 pg/mL — AB (ref 180–914)

## 2017-09-24 MED ORDER — INSULIN ASPART 100 UNIT/ML ~~LOC~~ SOLN
0.0000 [IU] | Freq: Three times a day (TID) | SUBCUTANEOUS | Status: DC
  Administered 2017-09-24 – 2017-09-25 (×2): 4 [IU] via SUBCUTANEOUS
  Administered 2017-09-25: 3 [IU] via SUBCUTANEOUS
  Administered 2017-09-25: 7 [IU] via SUBCUTANEOUS
  Administered 2017-09-26: 4 [IU] via SUBCUTANEOUS

## 2017-09-24 MED ORDER — ISOSORBIDE MONONITRATE ER 60 MG PO TB24
30.0000 mg | ORAL_TABLET | Freq: Every day | ORAL | Status: DC
  Administered 2017-09-25 – 2017-09-26 (×2): 30 mg via ORAL
  Filled 2017-09-24 (×2): qty 1

## 2017-09-24 MED ORDER — CLOPIDOGREL BISULFATE 75 MG PO TABS
75.0000 mg | ORAL_TABLET | Freq: Every day | ORAL | Status: DC
  Administered 2017-09-25 – 2017-09-26 (×2): 75 mg via ORAL
  Filled 2017-09-24 (×2): qty 1

## 2017-09-24 MED ORDER — SODIUM CHLORIDE 0.9 % IV BOLUS
500.0000 mL | Freq: Once | INTRAVENOUS | Status: AC
  Administered 2017-09-24: 500 mL via INTRAVENOUS

## 2017-09-24 MED ORDER — INSULIN ASPART 100 UNIT/ML ~~LOC~~ SOLN: 0.0000 [IU] | Freq: Every day | SUBCUTANEOUS | Status: DC

## 2017-09-24 MED ORDER — ONDANSETRON HCL 4 MG/2ML IJ SOLN: 4.0000 mg | Freq: Four times a day (QID) | INTRAMUSCULAR | Status: DC | PRN

## 2017-09-24 MED ORDER — ATORVASTATIN CALCIUM 20 MG PO TABS: 20.0000 mg | ORAL_TABLET | Freq: Every day | ORAL | Status: DC

## 2017-09-24 MED ORDER — ENOXAPARIN SODIUM 40 MG/0.4ML ~~LOC~~ SOLN
40.0000 mg | SUBCUTANEOUS | Status: DC
  Administered 2017-09-24 – 2017-09-25 (×2): 40 mg via SUBCUTANEOUS
  Filled 2017-09-24 (×2): qty 0.4

## 2017-09-24 MED ORDER — ONDANSETRON HCL 4 MG PO TABS: 4.0000 mg | ORAL_TABLET | Freq: Four times a day (QID) | ORAL | Status: DC | PRN

## 2017-09-24 MED ORDER — CARVEDILOL 3.125 MG PO TABS
6.2500 mg | ORAL_TABLET | Freq: Two times a day (BID) | ORAL | Status: DC
  Administered 2017-09-24 – 2017-09-25 (×2): 6.25 mg via ORAL
  Filled 2017-09-24 (×2): qty 2

## 2017-09-24 MED ORDER — TIMOLOL MALEATE 0.25 % OP SOLN
1.0000 [drp] | Freq: Every day | OPHTHALMIC | Status: DC
  Administered 2017-09-24 – 2017-09-26 (×3): 1 [drp] via OPHTHALMIC
  Filled 2017-09-24: qty 5

## 2017-09-24 MED ORDER — ACETAMINOPHEN 650 MG RE SUPP: 650.0000 mg | Freq: Four times a day (QID) | RECTAL | Status: DC | PRN

## 2017-09-24 MED ORDER — ACETAMINOPHEN 325 MG PO TABS: 650.0000 mg | ORAL_TABLET | Freq: Four times a day (QID) | ORAL | Status: DC | PRN

## 2017-09-24 MED ORDER — SODIUM CHLORIDE 0.9 % IV SOLN
INTRAVENOUS | Status: DC
  Administered 2017-09-24 – 2017-09-25 (×2): via INTRAVENOUS

## 2017-09-24 MED ORDER — HYDRALAZINE HCL 20 MG/ML IJ SOLN
10.0000 mg | Freq: Once | INTRAMUSCULAR | Status: AC
  Administered 2017-09-24: 10 mg via INTRAVENOUS
  Filled 2017-09-24: qty 1

## 2017-09-24 NOTE — H&P (Signed)
History and Physical  Steven HailJohn Swayne AOZ:[id number] DOB: 10-30-1940 DOA: 09/24/2017   PCP: Samuel JesterButler, Cynthia, DO   Patient coming from: Home  Chief Complaint: low BP, near syncope  HPI:  Steven Osborne is a 77 y.o. male with medical history of 3-week history of malaise, generalized weakness, and fluctuating blood pressures.  The patient states that approximately 2 weeks prior to this admission, he had noted that his blood pressures were running high with systolics in the 180s to 190 range.  He went to see his primary care provider at that time, but no new changes were made.  However, he continued to have malaise, intermittent dizziness, and fatigue.  He revisited his family physician on 09/21/2017.  The patient was instructed to restart his lisinopril which he had stopped taking for many months.  He was continued on Coreg.  During that visit, the patient was also instructed to stop his novolin N, and the patient was started on Jardiance.  Since then, the patient feels that his dizziness has slightly worsened.  He noted that his blood pressures has significantly dropped with systolics now in the 90-100 range particularly within the past 2 to 3 days.  The patient went to breakfast on the morning of 09/24/2017.  When he stood up, the patient had a near syncopal episode.  Apparently he had difficulty driving secondary to dizziness.  As result, the patient presented to hospital for further evaluation.  Since discontinuation of insulin, the patient stated that his blood sugar was running in the 400s on 09/23/2017.  As result, the patient took it upon himself to give himself 30 units of novolin N on 09/23/17 at 1600.  Since starting Jardiance, the patient has noted polyuria.  In addition, the patient also endorses taking his friend's Seroquel, Klonopin to help him sleep at night in the past 2 to 3 weeks.  In addition, he has been taking zanaflex 8 mg at hs. prior to this past 2 to 3 weeks, the patient had not been taking  Zanaflex regularly. Patient denies fevers, chills, headache, chest pain, dyspnea, nausea, vomiting, diarrhea, abdominal pain, dysuria, hematuria, hematochezia, and melena. In the emergency department, the patient was afebrile with soft blood pressures initially in the low 100s.  Oxygen saturation was 95% on room air.  BMP showed a serum creatinine 1.60.  LFTs were unremarkable.  CBC showed platelets of 117,000.  Initial troponin was negative.  Urinalysis showed proteinuria and glycosuria.  CT of the brain and chest x-ray were unremarkable.  EKG showed right bundle branch block.  Static vital signs were positive based upon his pulse.  As result, the patient was given 500 cc of fluid.  He was admitted for further evaluation.  Assessment/Plan: Near syncope/orthostatic hypotension -Secondary to dehydration -Multifactorial including solute diuresis from his Jardiance, medications (seroquel, zanaflex, klonopin) and elevated CBGs since stopping his insulin -continue IVF -Echo  Generalized weakness/Malaise -likely due to dehydration -Check serum B12  -Check TSH -Check folic acid -PT evaluation -Check lactic acid -Cycle troponins -Echocardiogram -UA neg for pyruia  Ischemic cardiomyopathy -Patient has history of CABG 2005 -06/12/2015 Echo EF 45-50%, trace MR/TR -Repeat echocardiogram -continue imdur -continue plavix  Thrombocytopenia -Check TSH -Serum B12 -Check coags -Check fibrinogen -abd US to eval for liver architecture and splenomegaly  Essential hypertension -Holding lisinopril secondary to soft blood pressure -Continue carvedilol at lower dose  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 ARTERIOSCLEROSIS 02/10/2010  . CAD 02/25/2010  . DIABETES TYPE 2 02/10/2010  . HYPERLIPIDEMIA NOS 02/10/2010    . HYPERTENSION 02/10/2010  . INTERMITTENT CLAUDICATION 02/10/2010  . MI, old    Past Surgical History:  Procedure Laterality Date  . CORONARY ARTERY BYPASS GRAFT  2005   4 vessel bypass    Social History:  reports that he has quit smoking. His smoking use included cigars. He quit smokeless tobacco use about 3 years ago. His smokeless tobacco use included chew. He reports that he does not drink alcohol or use drugs.   Family History  Problem Relation Age of Onset  . Diabetes Mother   . Heart disease Mother   . Diabetes Father   . Heart disease Father   . Stroke Father   . Diabetes Sister   . Diabetes Brother   . Hypertension Other      Allergies  Allergen Reactions  . Crestor [Rosuvastatin Calcium]     myalgia     Prior to Admission medications   Medication Sig Start Date End Date Taking? Authorizing Provider  atorvastatin (LIPITOR) 20 MG tablet Take 20 mg by mouth daily.   Yes [provider]  carvedilol (COREG) 12.5 MG tablet Take 12.5 mg by mouth 2 (two) times daily.  12/11/15  Yes [provider]  Cholecalciferol (VITAMIN D PO) Take 1 tablet by mouth daily.   Yes [provider]  clopidogrel (PLAVIX) 75 MG tablet TAKE ONE (1) TABLET EACH DAY 12/11/15  Yes [provider]  COENZYME Q10 PO Take 1 tablet by mouth daily.   Yes [provider]  empagliflozin (JARDIANCE) 10 MG TABS tablet Take 10 mg by mouth daily.   Yes [provider]  HYDROcodone-acetaminophen (NORCO/VICODIN) 5-325 MG tablet Take 1 tablet by mouth 2 (two) times daily.   Yes [provider]  isosorbide mononitrate (IMDUR) 30 MG 24 hr tablet Take 30 mg by mouth daily.  12/11/15  Yes [provider]  lisinopril (PRINIVIL,ZESTRIL) 20 MG tablet Take 20 mg by mouth daily.     Yes [provider]  LORazepam (ATIVAN) 0.5 MG tablet Take 0.5 mg by mouth 2 (two) times daily.   Yes [provider]  Omega-3 Fatty Acids (FISH OIL  PO) Take 1 capsule by mouth daily.   Yes [provider]  timolol (TIMOPTIC) 0.25 % ophthalmic solution Place 1 drop into both eyes.  08/01/11  Yes [provider]  tiZANidine (ZANAFLEX) 4 MG tablet Take 4 mg by mouth 2 (two) times daily.   Yes [provider]  VITAMIN E PO Take 1 tablet by mouth daily.   Yes [provider]  insulin NPH-regular Human (HUMULIN 70/30) (70-30) 100 UNIT/ML injection Inject 160 Units into the skin daily with breakfast. Patient not taking: Reported on 09/24/2017 12/21/15   Romero Belling, MD    Review of Systems:  Constitutional:  No weight loss, night sweats, Fevers, chills  Head&Eyes: No headache.  No vision loss.  No eye pain or scotoma ENT:  No Difficulty swallowing,Tooth/dental problems,Sore throat,  No ear ache, post nasal drip,  Cardio-vascular:  No chest pain, Orthopnea, PND, swelling in lower extremities,  palpitations  GI:  No  abdominal pain, nausea, vomiting, diarrhea, loss of appetite, hematochezia, melena, heartburn, indigestion, Resp:  No shortness of breath with exertion or at rest. No cough. No coughing up of blood .  No wheezing.No chest wall deformity  Skin:  no rash or lesions.  GU:  no dysuria, change in color of urine, no urgency or frequency. No flank pain.  Musculoskeletal:  No joint pain or swelling. No decreased range of motion. No back pain.  Psych:  No change in mood or affect. No depression or anxiety. Neurologic: No headache, no dysesthesia, no focal weakness, no vision loss. No syncope  Physical Exam: Vitals:   09/24/17 1330 09/24/17 1400 09/24/17 1430 09/24/17 1500  BP: (!) 147/74 135/83 (!) 123/92 (!) 146/88  Pulse: (!) 47 (!) 48 (!) 59 63  Resp: 14 19 13 19   Temp:      TempSrc:      SpO2: 96% 94% 95% 98%  Weight:      Height:       General:  A&O x 3, NAD, nontoxic, pleasant/cooperative Head/Eye: No conjunctival hemorrhage, no icterus, [place]/AT, No nystagmus ENT:  No icterus,  No  thrush, good dentition, no pharyngeal exudate Neck:  No masses, no lymphadenpathy, no bruits CV:  RRR, no rub, no gallop, no S3 Lung:  CTAB, good air movement, no wheeze, no rhonchi Abdomen: soft/NT, +BS, nondistended, no peritoneal signs Ext: No cyanosis, No rashes, No petechiae, No lymphangitis, No edema Neuro: CNII-XII intact, strength 4/5 in bilateral upper and lower extremities, no dysmetria  Labs on Admission:  Basic Metabolic Panel: Recent Labs  Lab 09/24/17 1235  NA 137  K 5.0  CL 104  CO2 28  GLUCOSE 287*  BUN 25*  CREATININE 1.60*  CALCIUM 8.5*   Liver Function Tests: Recent Labs  Lab 09/24/17 1235  AST 11*  ALT 9  ALKPHOS 79  BILITOT 0.9  PROT 6.9  ALBUMIN 3.7   No results for input(s): LIPASE, AMYLASE in the last 168 hours. No results for input(s): AMMONIA in the last 168 hours. CBC: Recent Labs  Lab 09/24/17 1235  WBC 8.2  NEUTROABS 6.6  HGB 14.0  HCT 41.9  MCV 87.8  PLT 117*   Coagulation Profile: No results for input(s): INR, PROTIME in the last 168 hours. Cardiac Enzymes: Recent Labs  Lab 09/24/17 1235  TROPONINI <0.03   BNP: Invalid input(s): POCBNP CBG: No results for input(s): GLUCAP in the last 168 hours. Urine analysis:    Component Value Date/Time   COLORURINE YELLOW 09/24/2017 1355   APPEARANCEUR CLEAR 09/24/2017 1355   LABSPEC 1.024 09/24/2017 1355   PHURINE 5.0 09/24/2017 1355   GLUCOSEU >=500 (A) 09/24/2017 1355   HGBUR NEGATIVE 09/24/2017 1355   BILIRUBINUR NEGATIVE 09/24/2017 1355   KETONESUR NEGATIVE 09/24/2017 1355   PROTEINUR 100 (A) 09/24/2017 1355   NITRITE NEGATIVE 09/24/2017 1355   LEUKOCYTESUR NEGATIVE 09/24/2017 1355   Sepsis Labs: @LABRCNTIP (procalcitonin:4,lacticidven:4) )No results found for this or any previous visit (from the past 240 hour(s)).   Radiological Exams on Admission: Dg Chest 2 View  Result Date: 09/24/2017 CLINICAL DATA:  Generalized weakness today. EXAM: CHEST - 2 VIEW COMPARISON:   03/14/2015 FINDINGS: Previous median sternotomy and CABG. Chronic cardiomegaly. The vascularity is normal. Lungs are clear. No effusions. No acute bone finding. IMPRESSION: No active disease.  Previous CABG. Electronically Signed   By: Paulina Fusi M.D.   On: 09/24/2017 13:43   Ct Head Wo Contrast  Result Date: 09/24/2017 CLINICAL DATA:  Generalized weakness.  Hypotension.  Dizziness. EXAM: CT HEAD WITHOUT CONTRAST TECHNIQUE: Contiguous axial images were obtained from the base of the skull through the vertex without intravenous contrast. COMPARISON:  None. FINDINGS: Brain:  There is no evidence of acute large territory infarct, intracranial hemorrhage, mass, midline shift, or extra-axial fluid collection. Patchy bilateral cerebral white matter hypodensities are non-specific but compatible with moderate chronic small vessel ischemic disease. Lacunar infarcts in the right corona radiata/external capsule are most likely chronic. Generalized cerebral atrophy is mild for age. Vascular: Calcified atherosclerosis at the skull base. No hyperdense vessel. Skull: No fracture or focal osseous lesion. Sinuses/Orbits: Visualized paranasal sinuses and mastoid air cells are clear. Orbits are unremarkable. Other: None. IMPRESSION: 1. No evidence of acute intracranial abnormality. 2. Moderate chronic small vessel ischemic disease. Electronically Signed   By: Sebastian Ache M.D.   On: 09/24/2017 14:02    EKG: Independently reviewed. Sinus, RBBB, LAFB    Time spent:70 minutes Code Status:   FULL Family Communication:  Significant other updated at bedside Disposition Plan: expect 1-2 day hospitalization Consults called: none DVT Prophylaxis: [place] Lovenox  Catarina Hartshorn, DO  [facility] Pager [phone]  If 7PM-7AM, please contact night-coverage [email or web address] Password [email or web address] 09/24/2017, 3:29 PM

## 2017-09-24 NOTE — ED Triage Notes (Addendum)
Patient c/o hypotension x3 weeks. Patient states "felt dizzy and staggering this morning." Per patient "almost passed out." Patient had blood pressure checked this morning at home 91/46. Patient does report taking blood pressure medication last night and this morning. Denies any pain or other symptoms. Patient takes Lisinopril 20mg .

## 2017-09-24 NOTE — ED Provider Notes (Signed)
[facility] EMERGENCY DEPARTMENT Provider Note   CSN: [id number] Arrival date & time: 09/24/17  1128     History   Chief Complaint Chief Complaint  Patient presents with  . Hypotension    HPI Steven Osborne is a 77 y.o. male.     Pt was seen at 1200. Per pt and his family, c/o gradual onset and worsening of persistent "blood pressure issues" for the past 3 weeks. Pt's daughter states pt's BP runs from "180/110" down to "91/46." Pt denies any change in his usual BP medications. Pt states he "nearly passed out this morning" after he stood up from eating at a restaurant and needed to "sit a while" before driving home. Pt states he was "all over the road driving" home due to near syncope feeling. Pt's daughter states pt's SBP was "90" when he came home and his "HR was lower than usual." Pt only c/o generalized weakness, otherwise denies any other complaints. Denies CP/palpitations, no SOB/cough, no abd pain, no N/V/D, no back pain, no focal motor weakness, no tingling/numbness in extremities.    Past Medical History:  Diagnosis Date  . ARTERIOSCLEROSIS 02/10/2010  . CAD 02/25/2010  . DIABETES TYPE 2 02/10/2010  . HYPERLIPIDEMIA NOS 02/10/2010  . HYPERTENSION 02/10/2010  . INTERMITTENT CLAUDICATION 02/10/2010  . MI, old     Patient Active Problem List   Diagnosis Date Noted  . Diabetes (HCC) 06/09/2015  . CAD 02/25/2010  . HYPERLIPIDEMIA NOS 02/10/2010  . HYPERTENSION 02/10/2010  . ARTERIOSCLEROSIS 02/10/2010  . INTERMITTENT CLAUDICATION 02/10/2010    Past Surgical History:  Procedure Laterality Date  . CORONARY ARTERY BYPASS GRAFT  2005   4 vessel bypass         Home Medications    Prior to Admission medications   Medication Sig Start Date End Date Taking? Authorizing Provider  atorvastatin (LIPITOR) 20 MG tablet Take 20 mg by mouth daily.   Yes [provider]  carvedilol (COREG) 12.5 MG tablet Take 12.5 mg by mouth 2 (two) times daily.  12/11/15  Yes  [provider]  Cholecalciferol (VITAMIN D PO) Take 1 tablet by mouth daily.   Yes [provider]  clopidogrel (PLAVIX) 75 MG tablet TAKE ONE (1) TABLET EACH DAY 12/11/15  Yes [provider]  COENZYME Q10 PO Take 1 tablet by mouth daily.   Yes [provider]  empagliflozin (JARDIANCE) 10 MG TABS tablet Take 10 mg by mouth daily.   Yes [provider]  HYDROcodone-acetaminophen (NORCO/VICODIN) 5-325 MG tablet Take 1 tablet by mouth 2 (two) times daily.   Yes [provider]  isosorbide mononitrate (IMDUR) 30 MG 24 hr tablet Take 30 mg by mouth daily.  12/11/15  Yes [provider]  lisinopril (PRINIVIL,ZESTRIL) 20 MG tablet Take 20 mg by mouth daily.     Yes [provider]  LORazepam (ATIVAN) 0.5 MG tablet Take 0.5 mg by mouth 2 (two) times daily.   Yes [provider]  Omega-3 Fatty Acids (FISH OIL PO) Take 1 capsule by mouth daily.   Yes [provider]  timolol (TIMOPTIC) 0.25 % ophthalmic solution Place 1 drop into both eyes.  08/01/11  Yes [provider]  tiZANidine (ZANAFLEX) 4 MG tablet Take 4 mg by mouth 2 (two) times daily.   Yes [provider]  VITAMIN E PO Take 1 tablet by mouth daily.   Yes [provider]  insulin NPH-regular Human (HUMULIN 70/30) (70-30) 100 UNIT/ML injection Inject 160 Units  into the skin daily with breakfast. Patient not taking: Reported on 09/24/2017 12/21/15   Romero Belling, MD    Family History Family History  Problem Relation Age of Onset  . Diabetes Mother   . Heart disease Mother   . Diabetes Father   . Heart disease Father   . Stroke Father   . Diabetes Sister   . Diabetes Brother   . Hypertension Other     Social History Social History   Tobacco Use  . Smoking status: Former Smoker    Types: Cigars  . Smokeless tobacco: Former Neurosurgeon    Types: Chew    Quit date: 02/21/2014  Substance Use Topics  . Alcohol use: No  . Drug  use: No     Allergies   Crestor [rosuvastatin calcium]   Review of Systems Review of Systems ROS: Statement: All systems negative except as marked or noted in the HPI; Constitutional: Negative for fever and chills. +generalized weakness.; ; Eyes: Negative for eye pain, redness and discharge. ; ; ENMT: Negative for ear pain, hoarseness, nasal congestion, sinus pressure and sore throat. ; ; Cardiovascular: Negative for chest pain, palpitations, diaphoresis, dyspnea and peripheral edema. ; ; Respiratory: Negative for cough, wheezing and stridor. ; ; Gastrointestinal: Negative for nausea, vomiting, diarrhea, abdominal pain, blood in stool, hematemesis, jaundice and rectal bleeding. . ; ; Genitourinary: Negative for dysuria, flank pain and hematuria. ; ; Musculoskeletal: Negative for back pain and neck pain. Negative for swelling and trauma.; ; Skin: Negative for pruritus, rash, abrasions, blisters, bruising and skin lesion.; ; Neuro: Negative for headache and neck stiffness. Negative for altered level of consciousness, altered mental status, extremity weakness, paresthesias, involuntary movement, seizure and +lightheadedness, near syncope.       Physical Exam Updated Vital Signs BP (!) 101/54 (BP Location: Right Arm)   Pulse (!) 50   Temp 98.4 F (36.9 C) (Oral)   Resp 15   Ht 5\' 10"  (1.778 m)   Wt 94.8 kg (209 lb)   SpO2 94%   BMI 29.99 kg/m      12:15 Orthostatic Vital Signs FS  Orthostatic Lying   BP- Lying: 111/66   Pulse- Lying: 48Abnormal        Orthostatic Sitting  BP- Sitting: 111/92Abnormal    Pulse- Sitting: 52       Orthostatic Standing at 0 minutes  BP- Standing at 0 minutes: 104/74   Pulse- Standing at 0 minutes: 66       Physical Exam 1205: Physical examination:  Nursing notes reviewed; Vital signs and O2 SAT reviewed;  Constitutional: Well developed, Well nourished, In no acute distress; Head:  Normocephalic, atraumatic; Eyes: EOMI, PERRL, No scleral  icterus; ENMT: Mouth and pharynx normal, Mucous membranes dry; Neck: Supple, Full range of motion, No lymphadenopathy; Cardiovascular: Bradycardic rate and rhythm, No gallop; Respiratory: Breath sounds clear & equal bilaterally, No wheezes.  Speaking full sentences with ease, Normal respiratory effort/excursion; Chest: Nontender, Movement normal; Abdomen: Soft, Nontender, Nondistended, Normal bowel sounds; Genitourinary: No CVA tenderness; Extremities: Peripheral pulses normal, No tenderness, No edema, No calf edema or asymmetry.; Neuro: AA&Ox3, Major CN grossly intact. No facial droop. Speech clear. Grips equal. Strength 5/5 equal bilat UE's and LE's. No gross focal motor or sensory deficits in extremities.; Skin: Color normal, Warm, Dry.   ED Treatments / Results  Labs (all labs ordered are listed, but only abnormal results are displayed)   EKG EKG Interpretation  Date/Time:  Sunday September 24 2017 11:56:10 EDT Ventricular  Rate:  49 PR Interval:    QRS Duration: 172 QT Interval:  535 QTC Calculation: 483 R Axis:   -100 Text Interpretation:  Sinus bradycardia RBBB and LAFB Nonspecific T wave abnormality Lateral leads When compared with ECG of 02/04/2003 Right bundle branch block is now Present Rate slower Nonspecific T wave abnormality Lateral leads is more pronounced Confirmed by Samuel Jester [phone]) on 09/24/2017 12:32:20 PM   Radiology   Procedures Procedures (including critical care time)  Medications Ordered in ED Medications  sodium chloride 0.9 % bolus 500 mL (has no administration in time range)     Initial Impression / Assessment and Plan / ED Course  I have reviewed the triage vital signs and the nursing notes.  Pertinent labs & imaging results that were available during my care of the patient were reviewed by me and considered in my medical decision making (see chart for details).  MDM Reviewed: previous chart, nursing note and vitals Reviewed previous: labs and  ECG Interpretation: labs, ECG, x-ray and CT scan   Results for orders placed or performed during the hospital encounter of 09/24/17  Comprehensive metabolic panel  Result Value Ref Range   Sodium 137 135 - 145 mmol/L   Potassium 5.0 3.5 - 5.1 mmol/L   Chloride 104 98 - 111 mmol/L   CO2 28 22 - 32 mmol/L   Glucose, Bld 287 (H) 70 - 99 mg/dL   BUN 25 (H) 8 - 23 mg/dL   Creatinine, Ser 6.04 (H) 0.61 - 1.24 mg/dL   Calcium 8.5 (L) 8.9 - 10.3 mg/dL   Total Protein 6.9 6.5 - 8.1 g/dL   Albumin 3.7 3.5 - 5.0 g/dL   AST 11 (L) 15 - 41 U/L   ALT 9 0 - 44 U/L   Alkaline Phosphatase 79 38 - 126 U/L   Total Bilirubin 0.9 0.3 - 1.2 mg/dL   GFR calc non Af Amer 40 (L) >60 mL/min   GFR calc Af Amer 47 (L) >60 mL/min   Anion gap 5 5 - 15  Troponin I  Result Value Ref Range   Troponin I <0.03 <0.03 ng/mL  CBC with Differential  Result Value Ref Range   WBC 8.2 4.0 - 10.5 K/uL   RBC 4.77 4.22 - 5.81 MIL/uL   Hemoglobin 14.0 13.0 - 17.0 g/dL   HCT 54.0 98.1 - 19.1 %   MCV 87.8 78.0 - 100.0 fL   MCH 29.4 26.0 - 34.0 pg   MCHC 33.4 30.0 - 36.0 g/dL   RDW 47.8 29.5 - 62.1 %   Platelets 117 (L) 150 - 400 K/uL   Neutrophils Relative % 79 %   Neutro Abs 6.6 1.7 - 7.7 K/uL   Lymphocytes Relative 12 %   Lymphs Abs 1.0 0.7 - 4.0 K/uL   Monocytes Relative 6 %   Monocytes Absolute 0.5 0.1 - 1.0 K/uL   Eosinophils Relative 2 %   Eosinophils Absolute 0.2 0.0 - 0.7 K/uL   Basophils Relative 1 %   Basophils Absolute 0.0 0.0 - 0.1 K/uL  Urinalysis, Routine w reflex microscopic  Result Value Ref Range   Color, Urine YELLOW YELLOW   APPearance CLEAR CLEAR   Specific Gravity, Urine 1.024 1.005 - 1.030   pH 5.0 5.0 - 8.0   Glucose, UA >=500 (A) NEGATIVE mg/dL   Hgb urine dipstick NEGATIVE NEGATIVE   Bilirubin Urine NEGATIVE NEGATIVE   Ketones, ur NEGATIVE NEGATIVE mg/dL   Protein, ur 308 (A) NEGATIVE mg/dL  Nitrite NEGATIVE NEGATIVE   Leukocytes, UA NEGATIVE NEGATIVE   RBC / HPF 0-5 0 - 5  RBC/hpf   WBC, UA 0-5 0 - 5 WBC/hpf   Bacteria, UA NONE SEEN NONE SEEN   Dg Chest 2 View Result Date: 09/24/2017 CLINICAL DATA:  Generalized weakness today. EXAM: CHEST - 2 VIEW COMPARISON:  03/14/2015 FINDINGS: Previous median sternotomy and CABG. Chronic cardiomegaly. The vascularity is normal. Lungs are clear. No effusions. No acute bone finding. IMPRESSION: No active disease.  Previous CABG. Electronically Signed   By: Paulina FusiMark  Shogry M.D.   On: 09/24/2017 13:43   Ct Head Wo Contrast Result Date: 09/24/2017 CLINICAL DATA:  Generalized weakness.  Hypotension.  Dizziness. EXAM: CT HEAD WITHOUT CONTRAST TECHNIQUE: Contiguous axial images were obtained from the base of the skull through the vertex without intravenous contrast. COMPARISON:  None. FINDINGS: Brain: There is no evidence of acute large territory infarct, intracranial hemorrhage, mass, midline shift, or extra-axial fluid collection. Patchy bilateral cerebral white matter hypodensities are non-specific but compatible with moderate chronic small vessel ischemic disease. Lacunar infarcts in the right corona radiata/external capsule are most likely chronic. Generalized cerebral atrophy is mild for age. Vascular: Calcified atherosclerosis at the skull base. No hyperdense vessel. Skull: No fracture or focal osseous lesion. Sinuses/Orbits: Visualized paranasal sinuses and mastoid air cells are clear. Orbits are unremarkable. Other: None. IMPRESSION: 1. No evidence of acute intracranial abnormality. 2. Moderate chronic small vessel ischemic disease. Electronically Signed   By: Sebastian AcheAllen  Grady M.D.   On: 09/24/2017 14:02    1430: Orthostatic on VS; judicious IVF given. Baseline HR appears 50-60's; HR in ED dipping into 40's. Question if this is also contributing to near syncope.  SBP in 110's after IVF bolus. Dx and testing d/w pt and family.  Questions answered.  Verb understanding, agreeable to admit.  T/C returned from [facility]. Arbutus Leasat, case discussed,  including:  HPI, pertinent PM/SHx, VS/PE, dx testing, ED course and treatment:  Agreeable to admit.     Final Clinical Impressions(s) / ED Diagnoses   Final diagnoses:  None    ED Discharge Orders    None       Samuel JesterMcManus, Ryker Pherigo, DO 09/26/17 1601

## 2017-09-25 ENCOUNTER — Other Ambulatory Visit (HOSPITAL_COMMUNITY): Payer: Medicare HMO

## 2017-09-25 ENCOUNTER — Observation Stay (HOSPITAL_COMMUNITY): Payer: Medicare HMO

## 2017-09-25 DIAGNOSIS — N183 Chronic kidney disease, stage 3 unspecified: Secondary | ICD-10-CM

## 2017-09-25 DIAGNOSIS — I959 Hypotension, unspecified: Secondary | ICD-10-CM | POA: Diagnosis not present

## 2017-09-25 DIAGNOSIS — N179 Acute kidney failure, unspecified: Secondary | ICD-10-CM | POA: Diagnosis not present

## 2017-09-25 DIAGNOSIS — D696 Thrombocytopenia, unspecified: Secondary | ICD-10-CM | POA: Diagnosis not present

## 2017-09-25 DIAGNOSIS — E1122 Type 2 diabetes mellitus with diabetic chronic kidney disease: Secondary | ICD-10-CM

## 2017-09-25 DIAGNOSIS — I255 Ischemic cardiomyopathy: Secondary | ICD-10-CM | POA: Diagnosis not present

## 2017-09-25 DIAGNOSIS — E113592 Type 2 diabetes mellitus with proliferative diabetic retinopathy without macular edema, left eye: Secondary | ICD-10-CM | POA: Diagnosis not present

## 2017-09-25 DIAGNOSIS — R55 Syncope and collapse: Secondary | ICD-10-CM | POA: Diagnosis not present

## 2017-09-25 DIAGNOSIS — E860 Dehydration: Secondary | ICD-10-CM | POA: Diagnosis not present

## 2017-09-25 DIAGNOSIS — I5022 Chronic systolic (congestive) heart failure: Secondary | ICD-10-CM | POA: Diagnosis not present

## 2017-09-25 LAB — CBC
HCT: 43.9 % (ref 39.0–52.0)
HEMOGLOBIN: 14.8 g/dL (ref 13.0–17.0)
MCH: 29.3 pg (ref 26.0–34.0)
MCHC: 33.7 g/dL (ref 30.0–36.0)
MCV: 86.9 fL (ref 78.0–100.0)
PLATELETS: 118 10*3/uL — AB (ref 150–400)
RBC: 5.05 MIL/uL (ref 4.22–5.81)
RDW: 13.4 % (ref 11.5–15.5)
WBC: 9.2 10*3/uL (ref 4.0–10.5)

## 2017-09-25 LAB — BASIC METABOLIC PANEL
Anion gap: 9 (ref 5–15)
BUN: 21 mg/dL (ref 8–23)
CALCIUM: 8.7 mg/dL — AB (ref 8.9–10.3)
CHLORIDE: 106 mmol/L (ref 98–111)
CO2: 22 mmol/L (ref 22–32)
CREATININE: 1.3 mg/dL — AB (ref 0.61–1.24)
GFR calc Af Amer: 60 mL/min — ABNORMAL LOW (ref >60)
GFR calc non Af Amer: 52 mL/min — ABNORMAL LOW (ref >60)
GLUCOSE: 199 mg/dL — AB (ref 70–99)
Potassium: 3.7 mmol/L (ref 3.5–5.1)
Sodium: 137 mmol/L (ref 135–145)

## 2017-09-25 LAB — GLUCOSE, CAPILLARY
GLUCOSE-CAPILLARY: 186 mg/dL — AB (ref 70–99)
Glucose-Capillary: 211 mg/dL — ABNORMAL HIGH (ref 70–99)
Glucose-Capillary: 137 mg/dL — ABNORMAL HIGH (ref 70–99)
Glucose-Capillary: 172 mg/dL — ABNORMAL HIGH (ref 70–99)

## 2017-09-25 LAB — LIPID PANEL
CHOL/HDL RATIO: 8 ratio
Cholesterol: 249 mg/dL — ABNORMAL HIGH (ref 0–200)
HDL: 31 mg/dL — ABNORMAL LOW (ref >40)
LDL CALC: 182 mg/dL — AB (ref 0–99)
Triglycerides: 178 mg/dL — ABNORMAL HIGH (ref <150)
VLDL: 36 mg/dL (ref 0–40)

## 2017-09-25 LAB — FOLATE: FOLATE: 11.2 ng/mL (ref >5.9)

## 2017-09-25 LAB — MAGNESIUM: MAGNESIUM: 1.8 mg/dL (ref 1.7–2.4)

## 2017-09-25 MED ORDER — CARVEDILOL 3.125 MG PO TABS
6.2500 mg | ORAL_TABLET | Freq: Two times a day (BID) | ORAL | Status: DC
  Administered 2017-09-25 – 2017-09-26 (×2): 6.25 mg via ORAL
  Filled 2017-09-25 (×2): qty 2

## 2017-09-25 MED ORDER — POTASSIUM CHLORIDE IN NACL 20-0.9 MEQ/L-% IV SOLN
INTRAVENOUS | Status: AC
  Administered 2017-09-25: 18:00:00 via INTRAVENOUS

## 2017-09-25 MED ORDER — HYDRALAZINE HCL 20 MG/ML IJ SOLN: 10.0000 mg | Freq: Four times a day (QID) | INTRAMUSCULAR | Status: DC | PRN

## 2017-09-25 MED ORDER — HYDRALAZINE HCL 25 MG PO TABS
25.0000 mg | ORAL_TABLET | Freq: Two times a day (BID) | ORAL | Status: DC
  Administered 2017-09-25 – 2017-09-26 (×2): 25 mg via ORAL
  Filled 2017-09-25 (×2): qty 1

## 2017-09-25 MED ORDER — ATORVASTATIN CALCIUM 40 MG PO TABS: 80.0000 mg | ORAL_TABLET | Freq: Every day | ORAL | Status: DC

## 2017-09-25 MED ORDER — MAGNESIUM SULFATE 2 GM/50ML IV SOLN
2.0000 g | Freq: Once | INTRAVENOUS | Status: AC
  Administered 2017-09-25: 2 g via INTRAVENOUS
  Filled 2017-09-25: qty 50

## 2017-09-25 MED ORDER — ATORVASTATIN CALCIUM 40 MG PO TABS
40.0000 mg | ORAL_TABLET | Freq: Every day | ORAL | Status: DC
  Administered 2017-09-25: 40 mg via ORAL
  Filled 2017-09-25: qty 1

## 2017-09-25 MED ORDER — CARVEDILOL 12.5 MG PO TABS: 12.5000 mg | ORAL_TABLET | Freq: Two times a day (BID) | ORAL | Status: DC

## 2017-09-25 MED ORDER — HYDRALAZINE HCL 20 MG/ML IJ SOLN
10.0000 mg | INTRAMUSCULAR | Status: DC | PRN
  Administered 2017-09-25: 10 mg via INTRAVENOUS
  Filled 2017-09-25: qty 1

## 2017-09-25 NOTE — Care Management Obs Status (Signed)
MEDICARE OBSERVATION STATUS NOTIFICATION   Patient Details  Name: Steven Osborne MRN: [id number] Date of Birth: 1940-10-28   Medicare Observation Status Notification Given:  Yes    Malcolm MetroChildress, [place] Keena Demske, RN 09/25/2017, 1:24 PM

## 2017-09-25 NOTE — Progress Notes (Signed)
Notified by tech bp over 200 systolic rechecked manual  bp elevated. notified on call order received for hydralazine. Given per orders

## 2017-09-25 NOTE — Progress Notes (Signed)
PROGRESS NOTE  Steven Osborne UEA:[id number] DOB: 1940/11/01 DOA: 09/24/2017 PCP: Samuel Jester, DO  Brief History:  77 y.o. male with medical history of 3-week history of malaise, generalized weakness, and fluctuating blood pressures.  The patient states that approximately 2 weeks prior to this admission, he had noted that his blood pressures were running high with systolics in the 180s to 190 range.  He went to see his primary care provider at that time, but no new changes were made.  However, he continued to have malaise, intermittent dizziness, and fatigue.  He revisited his family physician on 09/21/2017.  The patient was instructed to restart his lisinopril which he had stopped taking for many months.  He was continued on Coreg.  During that visit, the patient was also instructed to stop his novolin N, and the patient was started on Jardiance.  Since then, the patient feels that his dizziness has slightly worsened.  He noted that his blood pressures has significantly dropped with systolics now in the 90-100 range particularly within the past 2 to 3 days.  The patient went to breakfast on the morning of 09/24/2017.  When he stood up, the patient had a near syncopal episode.  Apparently he had difficulty driving secondary to dizziness.  As result, the patient presented to hospital for further evaluation Since discontinuation of insulin, the patient stated that his blood sugar was running in the 400s on 09/23/2017.  As result, the patient took it upon himself to give himself 30 units of novolin N on 09/23/17 at 1600.  Since starting Jardiance, the patient has noted polyuria.  In addition, the patient also endorses taking his friend's Seroquel, Klonopin to help him sleep at night in the past 2 to 3 weeks.  In addition, he has been taking zanaflex 8 mg at hs. prior to this past 2 to 3 weeks, the patient had not been taking Zanaflex regularly.   Assessment/Plan: Near syncope/orthostatic  hypotension -Secondary to dehydration -Multifactorial including solute diuresis from his Jardiance, medications (seroquel, zanaflex, klonopin) and elevated CBGs since stopping his insulin -continue IVF -Echo  Generalized weakness/Malaise -likely due to dehydration -Check serum B12  -Check TSH--1.166 -Check folic acid--11.2 -PT evaluation--no follow up needed -Check lactic acid--0.8 -Cycle troponins--neg x 3 -Echocardiogram -UA neg for pyruia  Ischemic cardiomyopathy -Patient has history of CABG 2005 -06/12/2015 Echo EF 45-50%, trace MR/TR -Repeat echocardiogram--pending -continue imdur -continue plavix  Thrombocytopenia -Check TSH--1.166 -Serum B12--2058 -Check coags--normal -Check fibrinogen--364 -abd [place] to eval for liver architecture and splenomegaly  Essential hypertension -holding lisinopril initially due to soft BP and worsen renal function -restart coreg-->bradycardia limits up titration -start hydralazine -continue imdur  Diabetes mellitus type 2 with nephropathy and retinopathy -Hemoglobin A1c--7.8 -NovoLog sliding scale -hold Jardiance  Hyperlipidemia -Continue statin -Check lipid panel-->LDL 182 -increase lipitor to 40 mg daily  CKD stage 3 -baseline creatinine 1.2-1.5 -am BMP     Disposition Plan:   Home 8/6 if stable Family Communication:   Family at bedside updated 8/5  Consultants:  none  Code Status:  FULL   DVT Prophylaxis:   [employer]ry Lovenox   Procedures: As Listed in Progress Note Above  Antibiotics: None    Subjective: Pt is feeling better.  He is no longer dizzy.  Patient denies fevers, chills, headache, chest pain, dyspnea, nausea, vomiting, diarrhea, abdominal pain, dysuria, hematuria, hematochezia, and melena.   Objective: Vitals:   09/24/17 2359 09/25/17 0245 09/25/17 4782 09/25/17 1716  BP: (!) 176/88 (!) 180/84 (!) 155/76 (!) 149/73  Pulse:   81 60  Resp:   19 20  Temp:   98 F (36.7 C) 98.8 F (37.1 C)   TempSrc:   Oral   SpO2:   97% 96%  Weight:      Height:        Intake/Output Summary (Last 24 hours) at 09/25/2017 1734 Last data filed at 09/25/2017 1625 Gross per 24 hour  Intake 847.5 ml  Output 400 ml  Net 447.5 ml   Weight change:  Exam:   General:  Pt is alert, follows commands appropriately, not in acute distress  HEENT: No icterus, No thrush, No neck mass, [place]/AT  Cardiovascular: RRR, S1/S2, no rubs, no gallops  Respiratory: CTA bilaterally, no wheezing, no crackles, no rhonchi  Abdomen: Soft/+BS, non tender, non distended, no guarding  Extremities: No edema, No lymphangitis, No petechiae, No rashes, no synovitis   Data Reviewed: I have personally reviewed following labs and imaging studies Basic Metabolic Panel: Recent Labs  Lab 09/24/17 1235 09/25/17 0628  NA 137 137  K 5.0 3.7  CL 104 106  CO2 28 22  GLUCOSE 287* 199*  BUN 25* 21  CREATININE 1.60* 1.30*  CALCIUM 8.5* 8.7*  MG  --  1.8   Liver Function Tests: Recent Labs  Lab 09/24/17 1235  AST 11*  ALT 9  ALKPHOS 79  BILITOT 0.9  PROT 6.9  ALBUMIN 3.7   No results for input(s): LIPASE, AMYLASE in the last 168 hours. No results for input(s): AMMONIA in the last 168 hours. Coagulation Profile: Recent Labs  Lab 09/24/17 1639  INR 1.09   CBC: Recent Labs  Lab 09/24/17 1235 09/25/17 0628  WBC 8.2 9.2  NEUTROABS 6.6  --   HGB 14.0 14.8  HCT 41.9 43.9  MCV 87.8 86.9  PLT 117* 118*   Cardiac Enzymes: Recent Labs  Lab 09/24/17 1235 09/24/17 1639 09/24/17 2234  TROPONINI <0.03 <0.03 <0.03   BNP: Invalid input(s): POCBNP CBG: Recent Labs  Lab 09/24/17 1648 09/24/17 2213 09/25/17 0737 09/25/17 1152 09/25/17 1633  GLUCAP 158* 182* 186* 211* 137*   HbA1C: Recent Labs    09/24/17 1235  HGBA1C 7.8*   Urine analysis:    Component Value Date/Time   COLORURINE YELLOW 09/24/2017 1355   APPEARANCEUR CLEAR 09/24/2017 1355   LABSPEC 1.024 09/24/2017 1355   PHURINE 5.0  09/24/2017 1355   GLUCOSEU >=500 (A) 09/24/2017 1355   HGBUR NEGATIVE 09/24/2017 1355   BILIRUBINUR NEGATIVE 09/24/2017 1355   KETONESUR NEGATIVE 09/24/2017 1355   PROTEINUR 100 (A) 09/24/2017 1355   NITRITE NEGATIVE 09/24/2017 1355   LEUKOCYTESUR NEGATIVE 09/24/2017 1355   Sepsis Labs: @LABRCNTIP (procalcitonin:4,lacticidven:4) )No results found for this or any previous visit (from the past 240 hour(s)).   Scheduled Meds: . atorvastatin  40 mg Oral q1800  . carvedilol  6.25 mg Oral BID WC  . clopidogrel  75 mg Oral Daily  . enoxaparin (LOVENOX) injection  40 mg Subcutaneous Q24H  . hydrALAZINE  25 mg Oral BID  . insulin aspart  0-20 Units Subcutaneous TID WC  . insulin aspart  0-5 Units Subcutaneous QHS  . isosorbide mononitrate  30 mg Oral Daily  . timolol  1 drop Both Eyes Daily   Continuous Infusions: . 0.9 % NaCl with KCl 20 mEq / L      Procedures/Studies: Dg Chest 2 View  Result Date: 09/24/2017 CLINICAL DATA:  Generalized weakness today. EXAM: CHEST -  2 VIEW COMPARISON:  03/14/2015 FINDINGS: Previous median sternotomy and CABG. Chronic cardiomegaly. The vascularity is normal. Lungs are clear. No effusions. No acute bone finding. IMPRESSION: No active disease.  Previous CABG. Electronically Signed   By: Paulina FusiMark  Shogry M.D.   On: 09/24/2017 13:43   Ct Head Wo Contrast  Result Date: 09/24/2017 CLINICAL DATA:  Generalized weakness.  Hypotension.  Dizziness. EXAM: CT HEAD WITHOUT CONTRAST TECHNIQUE: Contiguous axial images were obtained from the base of the skull through the vertex without intravenous contrast. COMPARISON:  None. FINDINGS: Brain: There is no evidence of acute large territory infarct, intracranial hemorrhage, mass, midline shift, or extra-axial fluid collection. Patchy bilateral cerebral white matter hypodensities are non-specific but compatible with moderate chronic small vessel ischemic disease. Lacunar infarcts in the right corona radiata/external capsule are most  likely chronic. Generalized cerebral atrophy is mild for age. Vascular: Calcified atherosclerosis at the skull base. No hyperdense vessel. Skull: No fracture or focal osseous lesion. Sinuses/Orbits: Visualized paranasal sinuses and mastoid air cells are clear. Orbits are unremarkable. Other: None. IMPRESSION: 1. No evidence of acute intracranial abnormality. 2. Moderate chronic small vessel ischemic disease. Electronically Signed   By: Sebastian AcheAllen  Grady M.D.   On: 09/24/2017 14:02   [place] Abdomen Complete  Result Date: 09/25/2017 CLINICAL DATA:  Renal failure, low platelets, hyperlipidemia, diabetes. EXAM: ABDOMEN ULTRASOUND COMPLETE COMPARISON:  None. FINDINGS: Bowel gas and the patient's body habitus limits the study. Gallbladder: No gallstones or wall thickening visualized. No sonographic Murphy sign noted by sonographer. Common bile duct: Diameter: 1.7 mm Liver: The hepatic echotexture is increased. The surface contour is smooth. There is no focal mass nor ductal dilation. Portal vein is patent on color Doppler imaging with normal direction of blood flow towards the liver. IVC: No abnormality visualized. Pancreas: Bowel gas obscures the pancreas. Spleen: Bowel gas limits evaluation of the spleen. As best as can be determined it is not enlarged. Right Kidney: Length: 12.9 cm. The cortical echotexture is increased. There is a lower pole cystic structure with internal echoes measuring 6 x 5.4 x 6.1 cm. There is an adjacent cystic structure with more internal echogenicity measuring 3.4 x 2.7 x 2.9 cm Left Kidney: Length: 12.3 cm. The cortical echotexture is increased. Abdominal aorta: The mid aorta is obscured. No distal aortic aneurysm is observed. Proximally the maximal diameter is 2.7 cm. Other findings: None. IMPRESSION: Somewhat limited study. No sonographic evidence of gallstones or acute cholecystitis. Increased hepatic echotexture compatible with fatty infiltrative change. Limited visualization of the spleen. No  definite splenomegaly is observed. Increased renal cortical echotexture compatible with medical renal disease. Non simple appearing cystic structures in the lower pole of the right kidney. Renal protocol MRI would be a useful next imaging step to further evaluate the right kidney. Limited visualization of the abdominal aorta.  No definite aneurysm. Electronically Signed   By: Laurina Fischl  [place] M.D.   On: 09/25/2017 09:40    Catarina Hartshornavid Ezariah Nace, DO  [facility] Pager [phone]  If 7PM-7AM, please contact night-coverage [email or web address] Password [email or web address] 09/25/2017, 5:34 PM   LOS: 0 days

## 2017-09-25 NOTE — Progress Notes (Signed)
Patient informed nurse that he take 12 of coreg at home. Notified Dr. Robb Matarrtiz. Patient blood pressure down at this time

## 2017-09-25 NOTE — Evaluation (Signed)
Physical Therapy Evaluation Patient Details Name: Steven Osborne MRN: [id number] DOB: [birth year] Today's Date: 09/25/2017   History of Present Illness  Steven Osborne is a 77 y.o. male with medical history of 3-week history of malaise, generalized weakness, and fluctuating blood pressures.  The patient states that approximately 2 weeks prior to this admission, he had noted that his blood pressures were running high with systolics in the 180s to 190 range.  He went to see his primary care provider at that time, but no new changes were made.  However, he continued to have malaise, intermittent dizziness, and fatigue.  He revisited his family physician on 09/21/2017.  The patient was instructed to restart his lisinopril which he had stopped taking for many months.  He was continued on Coreg.  During that visit, the patient was also instructed to stop his novolin N, and the patient was started on Jardiance.  Since then, the patient feels that his dizziness has slightly worsened.  He noted that his blood pressures has significantly dropped with systolics now in the 90-100 range particularly within the past 2 to 3 days.  The patient went to breakfast on the morning of 09/24/2017.  When he stood up, the patient had a near syncopal episode.  Apparently he had difficulty driving secondary to dizziness.  As result, the patient presented to hospital for further evaluation.    Clinical Impression  Patient functioning at baseline for functional mobility and gait,  demonstrates good return for ambulating on level, inclined and declined surfaces without loss of balance.  Plan:  Patient discharged from physical therapy to care of nursing for ambulation daily as tolerated for length of stay.      Follow Up Recommendations No PT follow up    Equipment Recommendations  None recommended by PT    Recommendations for Other Services       Precautions / Restrictions Precautions Precautions: None Restrictions Weight Bearing  Restrictions: No      Mobility  Bed Mobility Overal bed mobility: Independent                Transfers Overall transfer level: Independent                  Ambulation/Gait Ambulation/Gait assistance: Independent Gait Distance (Feet): 200 Feet Assistive device: None Gait Pattern/deviations: WFL(Within Functional Limits) Gait velocity: normal   General Gait Details: WFL on level, inclined and declined surfaces  Stairs            Wheelchair Mobility    Modified Rankin (Stroke Patients Only)       Balance Overall balance assessment: No apparent balance deficits (not formally assessed)                                           Pertinent Vitals/Pain Pain Assessment: No/denies pain    Home Living Family/patient expects to be discharged to:: Private residence Living Arrangements: Alone Available Help at Discharge: Family;Friend(s) Type of Home: Apartment Home Access: Level entry     Home Layout: Two level;Bed/bath upstairs Home Equipment: Walker - 2 wheels;Cane - single point      Prior Function Level of Independence: Independent         Comments: community ambulator, drives     Higher education careers adviser        Extremity/Trunk Assessment   Upper Extremity Assessment Upper Extremity Assessment: Overall WFL for tasks  assessed    Lower Extremity Assessment Lower Extremity Assessment: Overall WFL for tasks assessed    Cervical / Trunk Assessment Cervical / Trunk Assessment: Normal  Communication   Communication: No difficulties  Cognition Arousal/Alertness: Awake/alert Behavior During Therapy: WFL for tasks assessed/performed Overall Cognitive Status: Within Functional Limits for tasks assessed                                        General Comments      Exercises     Assessment/Plan    PT Assessment Patent does not need any further PT services  PT Problem List         PT Treatment  Interventions      PT Goals (Current goals can be found in the Care Plan section)  Acute Rehab PT Goals Patient Stated Goal: return home PT Goal Formulation: With patient Time For Goal Achievement: 09/25/17 Potential to Achieve Goals: Good    Frequency     Barriers to discharge        Co-evaluation               AM-PAC PT "6 Clicks" Daily Activity  Outcome Measure Difficulty turning over in bed (including adjusting bedclothes, sheets and blankets)?: None Difficulty moving from lying on back to sitting on the side of the bed? : None Difficulty sitting down on and standing up from a chair with arms (e.g., wheelchair, bedside commode, etc,.)?: None Help needed moving to and from a bed to chair (including a wheelchair)?: None Help needed walking in hospital room?: None Help needed climbing 3-5 steps with a railing? : None 6 Click Score: 24    End of Session   Activity Tolerance: Patient tolerated treatment well Patient left: in bed;with call bell/phone within reach Nurse Communication: Mobility status PT Visit Diagnosis: Unsteadiness on feet (R26.81);Other abnormalities of gait and mobility (R26.89);Muscle weakness (generalized) (M62.81)    Time: [id number] PT Time Calculation (min) (ACUTE ONLY): 19 min   Charges:   PT Evaluation $PT Eval Low Complexity: 1 Low PT Treatments $Gait Training: 8-22 mins        3:18 PM, 09/25/17 Ocie BobJames Kamarie Palma, MPT Physical Therapist with [facility] [phone] office [phone] mobile phone

## 2017-09-25 NOTE — Discharge Summary (Signed)
Physician Discharge Summary  Steven Osborne WUJ:[id number] DOB: 03-05-1940 DOA: 09/24/2017  PCP: Samuel Jester, DO  Admit date: 09/24/2017 Discharge date: 09/26/2017  Admitted From: Home Disposition:  Home   Recommendations for Outpatient Follow-up:  1. Follow up with PCP in 1-2 weeks 2. Please obtain BMP/CBC in one week    Discharge Condition: Stable CODE STATUS:FULL Diet recommendation: Heart Healthy / Carb Modified   Brief/Interim Summary: 77 y.o.malewith medical history of3-week history of malaise, generalized weakness, and fluctuating blood pressures. The patient states that approximately 2 weeks prior to this admission, he had noted that his blood pressures were running high with systolics in the 180s to 190 range. He went to see his primary care provider at that time, but no new changes were made. However, he continued to have malaise, intermittent dizziness, and fatigue. He revisited his family physician on 09/21/2017. The patient was instructed to restart his lisinopril which he had stopped taking for many months. He was continued on Coreg.During that visit, the patient was also instructed to stop hisnovolin N,and the patient was started onJardiance.Since then, the patient feels that his dizziness has slightly worsened. He noted that his blood pressures has significantly dropped with systolics now in the 90-100 range particularly within the past 2 to 3 days. The patient went to breakfast on the morning of 09/24/2017. When he stood up, the patient had a near syncopal episode. Apparently he had difficulty driving secondary to dizziness. As result, the patient presented to hospital for further evaluation Since discontinuation of insulin, the patient stated that his blood sugar was running in the 400s on 09/23/2017. As result, the patient took it upon himself to give himself 30 units of novolin N on 09/23/17 at 1600.Since startingJardiance,the patient has noted polyuria. In  addition, the patient also endorses taking his friend's Seroquel, Klonopin to help him sleep at night in the past 2 to 3 weeks. In addition, he has been taking zanaflex 8 mg at hs.prior to this past 2 to 3 weeks, the patient had not been taking Zanaflex regularly.    Discharge Diagnoses:  Near syncope/orthostatic hypotension -Secondary to dehydration -Multifactorial includingsolute diuresis from his Jardiance, medications (seroquel, zanaflex, klonopin) and elevated CBGs since stopping his insulin -continue IVF -Echo--patient did not want to wait for results of echo   Generalized weakness/Malaise -likely due to dehydration -Check serum B12--2058 -Check TSH--1.166 -Check folic acid--11.2 -PT evaluation--no follow up needed -Check lactic acid--0.8 -Cycle troponins--neg x 3 -Echocardiogram--patient did not want to wait for results of echo  -UA neg for pyruia  Ischemic cardiomyopathy -Patient has history of CABG 2005 -06/12/2015 Echo EF 45-50%, trace MR/TR -Repeat echocardiogram--pending -continue imdur -continue plavix  Thrombocytopenia -Check TSH--1.166 -Serum B12--2058 -Check coags--normal -Check fibrinogen--364 -abd [place] to eval for liver architecture and splenomegaly--> revealed hepatic steatosis without definitive splenomegaly.  Medical renal disease was noted.  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 -Hemoglobin A1c--7.8 -NovoLog sliding scale -hold Jardiance -d/c home with novolin N 20 units bid which was his prior home dose (previously taking 40 units bid prior to 8/1) -pt instructed to check CBGs 4 times daily and to keep a log with which he will take to his PCP whom can adjust his  regimen  Hyperlipidemia -Continue statin -Check lipid panel-->LDL 182 -increase lipitor  to 40 mg daily -will likely need continued uptitration, but will take it slow due to previous intolerance to crestor  CKD stage 3 -baseline creatinine 1.2-1.5 -am BMP        Discharge Instructions   Allergies as of 09/26/2017      Reactions   Crestor [rosuvastatin Calcium]    myalgia      Medication List    STOP taking these medications   HYDROcodone-acetaminophen 5-325 MG tablet Commonly known as:  NORCO/VICODIN   insulin NPH-regular Human (70-30) 100 UNIT/ML injection Commonly known as:  HUMULIN 70/30   JARDIANCE 10 MG Tabs tablet Generic drug:  empagliflozin   lisinopril 20 MG tablet Commonly known as:  PRINIVIL,ZESTRIL   tiZANidine 4 MG tablet Commonly known as:  ZANAFLEX     TAKE these medications   atorvastatin 40 MG tablet Commonly known as:  LIPITOR Take 1 tablet (40 mg total) by mouth daily at 6 PM. What changed:    medication strength  how much to take  when to take this   carvedilol 6.25 MG tablet Commonly known as:  COREG Take 1 tablet (6.25 mg total) by mouth 2 (two) times daily with a meal. What changed:    medication strength  how much to take  when to take this   clopidogrel 75 MG tablet Commonly known as:  PLAVIX TAKE ONE (1) TABLET EACH DAY   COENZYME Q10 PO Take 1 tablet by mouth daily.   FISH OIL PO Take 1 capsule by mouth daily.   hydrALAZINE 25 MG tablet Commonly known as:  APRESOLINE Take 1 tablet (25 mg total) by mouth every 8 (eight) hours.   insulin NPH Human 100 UNIT/ML injection Commonly known as:  NOVOLIN N Inject 0.2 mLs (20 Units total) into the skin 2 (two) times daily before a meal.   isosorbide mononitrate 30 MG 24 hr tablet Commonly known as:  IMDUR Take 30 mg by mouth daily.   LORazepam 0.5 MG tablet Commonly known as:  ATIVAN Take 0.5 mg by mouth 2 (two) times daily.   timolol 0.25 % ophthalmic  solution Commonly known as:  TIMOPTIC Place 1 drop into both eyes.   VITAMIN D PO Take 1 tablet by mouth daily.   VITAMIN E PO Take 1 tablet by mouth daily.       Allergies  Allergen Reactions  . Crestor [Rosuvastatin Calcium]     myalgia    Consultations:  none   Procedures/Studies: Dg Chest 2 View  Result Date: 09/24/2017 CLINICAL DATA:  Generalized weakness today. EXAM: CHEST - 2 VIEW COMPARISON:  03/14/2015 FINDINGS: Previous median sternotomy and CABG. Chronic cardiomegaly. The vascularity is normal. Lungs are clear. No effusions. No acute bone finding. IMPRESSION: No active disease.  Previous CABG. Electronically Signed   By: Paulina Fusi M.D.   On: 09/24/2017 13:43   Ct Head Wo Contrast  Result Date: 09/24/2017 CLINICAL DATA:  Generalized weakness.  Hypotension.  Dizziness. EXAM: CT HEAD WITHOUT CONTRAST TECHNIQUE: Contiguous axial images were obtained from the base of the skull through the vertex without intravenous contrast. COMPARISON:  None. FINDINGS: Brain: There is no evidence of acute large territory infarct, intracranial hemorrhage, mass, midline shift, or extra-axial fluid collection. Patchy bilateral cerebral white matter hypodensities are non-specific but compatible with moderate chronic small vessel ischemic disease. Lacunar infarcts in the right corona radiata/external capsule are most likely chronic. Generalized cerebral atrophy is mild for age. Vascular: Calcified atherosclerosis at the skull base. No hyperdense vessel. Skull:  No fracture or focal osseous lesion. Sinuses/Orbits: Visualized paranasal sinuses and mastoid air cells are clear. Orbits are unremarkable. Other: None. IMPRESSION: 1. No evidence of acute intracranial abnormality. 2. Moderate chronic small vessel ischemic disease. Electronically Signed   By: Sebastian AcheAllen  Grady M.D.   On: 09/24/2017 14:02   [place] Abdomen Complete  Result Date: 09/25/2017 CLINICAL DATA:  Renal failure, low platelets, hyperlipidemia,  diabetes. EXAM: ABDOMEN ULTRASOUND COMPLETE COMPARISON:  None. FINDINGS: Bowel gas and the patient's body habitus limits the study. Gallbladder: No gallstones or wall thickening visualized. No sonographic Murphy sign noted by sonographer. Common bile duct: Diameter: 1.7 mm Liver: The hepatic echotexture is increased. The surface contour is smooth. There is no focal mass nor ductal dilation. Portal vein is patent on color Doppler imaging with normal direction of blood flow towards the liver. IVC: No abnormality visualized. Pancreas: Bowel gas obscures the pancreas. Spleen: Bowel gas limits evaluation of the spleen. As best as can be determined it is not enlarged. Right Kidney: Length: 12.9 cm. The cortical echotexture is increased. There is a lower pole cystic structure with internal echoes measuring 6 x 5.4 x 6.1 cm. There is an adjacent cystic structure with more internal echogenicity measuring 3.4 x 2.7 x 2.9 cm Left Kidney: Length: 12.3 cm. The cortical echotexture is increased. Abdominal aorta: The mid aorta is obscured. No distal aortic aneurysm is observed. Proximally the maximal diameter is 2.7 cm. Other findings: None. IMPRESSION: Somewhat limited study. No sonographic evidence of gallstones or acute cholecystitis. Increased hepatic echotexture compatible with fatty infiltrative change. Limited visualization of the spleen. No definite splenomegaly is observed. Increased renal cortical echotexture compatible with medical renal disease. Non simple appearing cystic structures in the lower pole of the right kidney. Renal protocol MRI would be a useful next imaging step to further evaluate the right kidney. Limited visualization of the abdominal aorta.  No definite aneurysm. Electronically Signed   By: Niralya Ohanian  [place] M.D.   On: 09/25/2017 09:40        Discharge Exam: Vitals:   09/25/17 2300 09/26/17 0638  BP: (!) 150/88 (!) 161/81  Pulse:  72  Resp:    Temp:  98.4 F (36.9 C)  SpO2:  96%   Vitals:    09/25/17 1716 09/25/17 2155 09/25/17 2300 09/26/17 0638  BP: (!) 149/73 (!) 169/89 (!) 150/88 (!) 161/81  Pulse: 60 68  72  Resp: 20     Temp: 98.8 F (37.1 C) 97.7 F (36.5 C)  98.4 F (36.9 C)  TempSrc:  Oral  Oral  SpO2: 96% 94%  96%  Weight:      Height:        General: Pt is alert, awake, not in acute distress Cardiovascular: RRR, S1/S2 +, no rubs, no gallops Respiratory: CTA bilaterally, no wheezing, no rhonchi Abdominal: Soft, NT, ND, bowel sounds + Extremities: no edema, no cyanosis   The results of significant diagnostics from this hospitalization (including imaging, microbiology, ancillary and laboratory) are listed below for reference.    Significant Diagnostic Studies: Dg Chest 2 View  Result Date: 09/24/2017 CLINICAL DATA:  Generalized weakness today. EXAM: CHEST - 2 VIEW COMPARISON:  03/14/2015 FINDINGS: Previous median sternotomy and CABG. Chronic cardiomegaly. The vascularity is normal. Lungs are clear. No effusions. No acute bone finding. IMPRESSION: No active disease.  Previous CABG. Electronically Signed   By: Paulina FusiMark  Shogry M.D.   On: 09/24/2017 13:43   Ct Head Wo Contrast  Result Date: 09/24/2017 CLINICAL DATA:  Generalized  weakness.  Hypotension.  Dizziness. EXAM: CT HEAD WITHOUT CONTRAST TECHNIQUE: Contiguous axial images were obtained from the base of the skull through the vertex without intravenous contrast. COMPARISON:  None. FINDINGS: Brain: There is no evidence of acute large territory infarct, intracranial hemorrhage, mass, midline shift, or extra-axial fluid collection. Patchy bilateral cerebral white matter hypodensities are non-specific but compatible with moderate chronic small vessel ischemic disease. Lacunar infarcts in the right corona radiata/external capsule are most likely chronic. Generalized cerebral atrophy is mild for age. Vascular: Calcified atherosclerosis at the skull base. No hyperdense vessel. Skull: No fracture or focal osseous lesion.  Sinuses/Orbits: Visualized paranasal sinuses and mastoid air cells are clear. Orbits are unremarkable. Other: None. IMPRESSION: 1. No evidence of acute intracranial abnormality. 2. Moderate chronic small vessel ischemic disease. Electronically Signed   By: Sebastian Ache M.D.   On: 09/24/2017 14:02   US Abdomen Complete  Result Date: 09/25/2017 CLINICAL DATA:  Renal failure, low platelets, hyperlipidemia, diabetes. EXAM: ABDOMEN ULTRASOUND COMPLETE COMPARISON:  None. FINDINGS: Bowel gas and the patient's body habitus limits the study. Gallbladder: No gallstones or wall thickening visualized. No sonographic Murphy sign noted by sonographer. Common bile duct: Diameter: 1.7 mm Liver: The hepatic echotexture is increased. The surface contour is smooth. There is no focal mass nor ductal dilation. Portal vein is patent on color Doppler imaging with normal direction of blood flow towards the liver. IVC: No abnormality visualized. Pancreas: Bowel gas obscures the pancreas. Spleen: Bowel gas limits evaluation of the spleen. As best as can be determined it is not enlarged. Right Kidney: Length: 12.9 cm. The cortical echotexture is increased. There is a lower pole cystic structure with internal echoes measuring 6 x 5.4 x 6.1 cm. There is an adjacent cystic structure with more internal echogenicity measuring 3.4 x 2.7 x 2.9 cm Left Kidney: Length: 12.3 cm. The cortical echotexture is increased. Abdominal aorta: The mid aorta is obscured. No distal aortic aneurysm is observed. Proximally the maximal diameter is 2.7 cm. Other findings: None. IMPRESSION: Somewhat limited study. No sonographic evidence of gallstones or acute cholecystitis. Increased hepatic echotexture compatible with fatty infiltrative change. Limited visualization of the spleen. No definite splenomegaly is observed. Increased renal cortical echotexture compatible with medical renal disease. Non simple appearing cystic structures in the lower pole of the right  kidney. Renal protocol MRI would be a useful next imaging step to further evaluate the right kidney. Limited visualization of the abdominal aorta.  No definite aneurysm. Electronically Signed   By: Davide Risdon  [place] M.D.   On: 09/25/2017 09:40     Microbiology: No results found for this or any previous visit (from the past 240 hour(s)).   Labs: Basic Metabolic Panel: Recent Labs  Lab 09/24/17 1235 09/25/17 0628 09/26/17 0933  NA 137 137 134*  K 5.0 3.7 4.2  CL 104 106 106  CO2 28 22 23   GLUCOSE 287* 199* 240*  BUN 25* 21 26*  CREATININE 1.60* 1.30* 1.28*  CALCIUM 8.5* 8.7* 8.4*  MG  --  1.8  --    Liver Function Tests: Recent Labs  Lab 09/24/17 1235  AST 11*  ALT 9  ALKPHOS 79  BILITOT 0.9  PROT 6.9  ALBUMIN 3.7   No results for input(s): LIPASE, AMYLASE in the last 168 hours. No results for input(s): AMMONIA in the last 168 hours. CBC: Recent Labs  Lab 09/24/17 1235 09/25/17 0628  WBC 8.2 9.2  NEUTROABS 6.6  --   HGB 14.0 14.8  HCT  41.9 43.9  MCV 87.8 86.9  PLT 117* 118*   Cardiac Enzymes: Recent Labs  Lab 09/24/17 1235 09/24/17 1639 09/24/17 2234  TROPONINI <0.03 <0.03 <0.03   BNP: Invalid input(s): POCBNP CBG: Recent Labs  Lab 09/25/17 0737 09/25/17 1152 09/25/17 1633 09/25/17 2156 09/26/17 0748  GLUCAP 186* 211* 137* 172* 159*    Time coordinating discharge:  36 minutes  Signed:  Catarina Hartshorn, DO [facility] Pager: [phone] 09/26/2017, 12:36 PM

## 2017-09-25 NOTE — Care Management Note (Signed)
Case Management Note  Patient Details  Name: Steven Osborne MRN: [id number] Date of Birth: 02/25/1940  Subjective/Objective:     Admitted with dehydration. Pt from home, lives alone. Says he recently "dropped off some little girl that was living with him" referring to a s/o. Pt is ind with ADL's. Drives, has insurance with drug coverage. Reports he takes his medications as prescribed, this is contradictory to other information received saying he was taking his s/o's medications. [id number]. He will see them again in Oct.               Action/Plan: DC home with self care. PT recommends no follow up. Pt communicates no needs or concerns at this time.   Expected Discharge Date:    09/26/2017              Expected Discharge Plan:  Home/Self Care  In-House Referral:  NA  Discharge planning Services  CM Consult  Post Acute Care Choice:  NA Choice offered to:  NA  Status of Service:  Completed, signed off  If discussed at Long Length of Stay Meetings, dates discussed:    Additional Comments:  Steven Osborne, Steven Chabot Demske, RN 09/25/2017, 2:28 PM

## 2017-09-26 ENCOUNTER — Observation Stay (HOSPITAL_BASED_OUTPATIENT_CLINIC_OR_DEPARTMENT_OTHER): Payer: Medicare HMO

## 2017-09-26 DIAGNOSIS — D696 Thrombocytopenia, unspecified: Secondary | ICD-10-CM | POA: Diagnosis not present

## 2017-09-26 DIAGNOSIS — E113592 Type 2 diabetes mellitus with proliferative diabetic retinopathy without macular edema, left eye: Secondary | ICD-10-CM | POA: Diagnosis not present

## 2017-09-26 DIAGNOSIS — R55 Syncope and collapse: Secondary | ICD-10-CM | POA: Diagnosis not present

## 2017-09-26 DIAGNOSIS — I959 Hypotension, unspecified: Secondary | ICD-10-CM | POA: Diagnosis not present

## 2017-09-26 DIAGNOSIS — I255 Ischemic cardiomyopathy: Secondary | ICD-10-CM | POA: Diagnosis not present

## 2017-09-26 DIAGNOSIS — I5022 Chronic systolic (congestive) heart failure: Secondary | ICD-10-CM | POA: Diagnosis not present

## 2017-09-26 DIAGNOSIS — E860 Dehydration: Secondary | ICD-10-CM | POA: Diagnosis not present

## 2017-09-26 DIAGNOSIS — N183 Chronic kidney disease, stage 3 (moderate): Secondary | ICD-10-CM | POA: Diagnosis not present

## 2017-09-26 DIAGNOSIS — E1122 Type 2 diabetes mellitus with diabetic chronic kidney disease: Secondary | ICD-10-CM | POA: Diagnosis not present

## 2017-09-26 LAB — BASIC METABOLIC PANEL
ANION GAP: 5 (ref 5–15)
BUN: 26 mg/dL — ABNORMAL HIGH (ref 8–23)
CALCIUM: 8.4 mg/dL — AB (ref 8.9–10.3)
CO2: 23 mmol/L (ref 22–32)
CREATININE: 1.28 mg/dL — AB (ref 0.61–1.24)
Chloride: 106 mmol/L (ref 98–111)
GFR, EST NON AFRICAN AMERICAN: 53 mL/min — AB (ref >60)
GLUCOSE: 240 mg/dL — AB (ref 70–99)
Potassium: 4.2 mmol/L (ref 3.5–5.1)
Sodium: 134 mmol/L — ABNORMAL LOW (ref 135–145)

## 2017-09-26 LAB — ECHOCARDIOGRAM COMPLETE
HEIGHTINCHES: 70 in
Weight: 3499.14 oz

## 2017-09-26 LAB — GLUCOSE, CAPILLARY: GLUCOSE-CAPILLARY: 159 mg/dL — AB (ref 70–99)

## 2017-09-26 MED ORDER — CARVEDILOL 6.25 MG PO TABS: 6.2500 mg | ORAL_TABLET | Freq: Two times a day (BID) | ORAL | 1 refills | Status: DC

## 2017-09-26 MED ORDER — HYDRALAZINE HCL 25 MG PO TABS: 25.0000 mg | ORAL_TABLET | Freq: Three times a day (TID) | ORAL | Status: DC

## 2017-09-26 MED ORDER — INSULIN NPH (HUMAN) (ISOPHANE) 100 UNIT/ML ~~LOC~~ SUSP: 20.0000 [IU] | Freq: Two times a day (BID) | SUBCUTANEOUS | 0 refills | Status: DC

## 2017-09-26 MED ORDER — HYDRALAZINE HCL 25 MG PO TABS: 25.0000 mg | ORAL_TABLET | Freq: Three times a day (TID) | ORAL | 1 refills | Status: DC

## 2017-09-26 MED ORDER — ATORVASTATIN CALCIUM 40 MG PO TABS: 40.0000 mg | ORAL_TABLET | Freq: Every day | ORAL | 1 refills | Status: DC

## 2017-09-26 NOTE — Progress Notes (Signed)
*  PRELIMINARY RESULTS* Echocardiogram 2D Echocardiogram has been performed.  Jeryl Columbialliott, Rolla Servidio 09/26/2017, 11:43 AM

## 2017-09-26 NOTE — Progress Notes (Signed)
Sheryle HailJohn Kudrna discharged Home per MD order.  Discharge instructions reviewed and discussed with the patient, all questions and concerns answered. Copy of instructions and scripts given to patient.  Allergies as of 09/26/2017      Reactions   Crestor [rosuvastatin Calcium]    myalgia      Medication List    STOP taking these medications   HYDROcodone-acetaminophen 5-325 MG tablet Commonly known as:  NORCO/VICODIN   insulin NPH-regular Human (70-30) 100 UNIT/ML injection Commonly known as:  HUMULIN 70/30   JARDIANCE 10 MG Tabs tablet Generic drug:  empagliflozin   lisinopril 20 MG tablet Commonly known as:  PRINIVIL,ZESTRIL   tiZANidine 4 MG tablet Commonly known as:  ZANAFLEX     TAKE these medications   atorvastatin 40 MG tablet Commonly known as:  LIPITOR Take 1 tablet (40 mg total) by mouth daily at 6 PM. What changed:    medication strength  how much to take  when to take this   carvedilol 6.25 MG tablet Commonly known as:  COREG Take 1 tablet (6.25 mg total) by mouth 2 (two) times daily with a meal. What changed:    medication strength  how much to take  when to take this   clopidogrel 75 MG tablet Commonly known as:  PLAVIX TAKE ONE (1) TABLET EACH DAY   COENZYME Q10 PO Take 1 tablet by mouth daily.   FISH OIL PO Take 1 capsule by mouth daily.   hydrALAZINE 25 MG tablet Commonly known as:  APRESOLINE Take 1 tablet (25 mg total) by mouth every 8 (eight) hours.   insulin NPH Human 100 UNIT/ML injection Commonly known as:  NOVOLIN N Inject 0.2 mLs (20 Units total) into the skin 2 (two) times daily before a meal.   isosorbide mononitrate 30 MG 24 hr tablet Commonly known as:  IMDUR Take 30 mg by mouth daily.   LORazepam 0.5 MG tablet Commonly known as:  ATIVAN Take 0.5 mg by mouth 2 (two) times daily.   timolol 0.25 % ophthalmic solution Commonly known as:  TIMOPTIC Place 1 drop into both eyes.   VITAMIN D PO Take 1 tablet by mouth  daily.   VITAMIN E PO Take 1 tablet by mouth daily.       Patients skin is clean, dry and intact, no evidence of skin break down. IV site discontinued and catheter remains intact. Site without signs and symptoms of complications. Dressing and pressure applied.  Patient escorted to car by NT in a wheelchair,  no distress noted upon discharge.  Rica KoyanagiBonnie M Charmagne Buhl 09/26/2017 2:58 PM

## 2017-10-06 DIAGNOSIS — E139 Other specified diabetes mellitus without complications: Secondary | ICD-10-CM | POA: Diagnosis not present

## 2017-10-06 DIAGNOSIS — I10 Essential (primary) hypertension: Secondary | ICD-10-CM | POA: Diagnosis not present

## 2017-10-06 DIAGNOSIS — Z9119 Patient's noncompliance with other medical treatment and regimen: Secondary | ICD-10-CM | POA: Diagnosis not present

## 2017-10-13 DIAGNOSIS — H44511 Absolute glaucoma, right eye: Secondary | ICD-10-CM | POA: Diagnosis not present

## 2017-10-16 DIAGNOSIS — E139 Other specified diabetes mellitus without complications: Secondary | ICD-10-CM | POA: Diagnosis not present

## 2017-10-16 DIAGNOSIS — Z794 Long term (current) use of insulin: Secondary | ICD-10-CM | POA: Diagnosis not present

## 2017-10-16 DIAGNOSIS — H18891 Other specified disorders of cornea, right eye: Secondary | ICD-10-CM | POA: Diagnosis not present

## 2017-10-16 DIAGNOSIS — H2512 Age-related nuclear cataract, left eye: Secondary | ICD-10-CM | POA: Diagnosis not present

## 2017-10-16 DIAGNOSIS — H44511 Absolute glaucoma, right eye: Secondary | ICD-10-CM | POA: Diagnosis not present

## 2017-10-16 DIAGNOSIS — I10 Essential (primary) hypertension: Secondary | ICD-10-CM | POA: Diagnosis not present

## 2017-10-16 DIAGNOSIS — Z9119 Patient's noncompliance with other medical treatment and regimen: Secondary | ICD-10-CM | POA: Diagnosis not present

## 2017-10-16 DIAGNOSIS — H1830 Unspecified corneal membrane change: Secondary | ICD-10-CM | POA: Diagnosis not present

## 2017-10-16 DIAGNOSIS — E1136 Type 2 diabetes mellitus with diabetic cataract: Secondary | ICD-10-CM | POA: Diagnosis not present

## 2017-10-16 DIAGNOSIS — E113312 Type 2 diabetes mellitus with moderate nonproliferative diabetic retinopathy with macular edema, left eye: Secondary | ICD-10-CM | POA: Diagnosis not present

## 2017-10-20 DIAGNOSIS — E139 Other specified diabetes mellitus without complications: Secondary | ICD-10-CM | POA: Diagnosis not present

## 2017-10-20 DIAGNOSIS — Z9119 Patient's noncompliance with other medical treatment and regimen: Secondary | ICD-10-CM | POA: Diagnosis not present

## 2017-10-20 DIAGNOSIS — I10 Essential (primary) hypertension: Secondary | ICD-10-CM | POA: Diagnosis not present

## 2017-11-03 DIAGNOSIS — I10 Essential (primary) hypertension: Secondary | ICD-10-CM | POA: Diagnosis not present

## 2017-11-03 DIAGNOSIS — E139 Other specified diabetes mellitus without complications: Secondary | ICD-10-CM | POA: Diagnosis not present

## 2017-11-03 DIAGNOSIS — I25810 Atherosclerosis of coronary artery bypass graft(s) without angina pectoris: Secondary | ICD-10-CM | POA: Diagnosis not present

## 2017-11-03 DIAGNOSIS — E7800 Pure hypercholesterolemia, unspecified: Secondary | ICD-10-CM | POA: Diagnosis not present

## 2017-11-03 DIAGNOSIS — M5136 Other intervertebral disc degeneration, lumbar region: Secondary | ICD-10-CM | POA: Diagnosis not present

## 2017-11-03 DIAGNOSIS — Z9119 Patient's noncompliance with other medical treatment and regimen: Secondary | ICD-10-CM | POA: Diagnosis not present

## 2017-11-10 DIAGNOSIS — I10 Essential (primary) hypertension: Secondary | ICD-10-CM | POA: Diagnosis not present

## 2017-11-10 DIAGNOSIS — Z9119 Patient's noncompliance with other medical treatment and regimen: Secondary | ICD-10-CM | POA: Diagnosis not present

## 2017-11-10 DIAGNOSIS — E7800 Pure hypercholesterolemia, unspecified: Secondary | ICD-10-CM | POA: Diagnosis not present

## 2017-11-10 DIAGNOSIS — E139 Other specified diabetes mellitus without complications: Secondary | ICD-10-CM | POA: Diagnosis not present

## 2017-11-20 DIAGNOSIS — I10 Essential (primary) hypertension: Secondary | ICD-10-CM | POA: Diagnosis not present

## 2017-11-20 DIAGNOSIS — E139 Other specified diabetes mellitus without complications: Secondary | ICD-10-CM | POA: Diagnosis not present

## 2017-11-20 DIAGNOSIS — E7800 Pure hypercholesterolemia, unspecified: Secondary | ICD-10-CM | POA: Diagnosis not present

## 2017-11-20 DIAGNOSIS — Z9119 Patient's noncompliance with other medical treatment and regimen: Secondary | ICD-10-CM | POA: Diagnosis not present

## 2017-11-23 DIAGNOSIS — E7800 Pure hypercholesterolemia, unspecified: Secondary | ICD-10-CM | POA: Diagnosis not present

## 2017-11-23 DIAGNOSIS — I10 Essential (primary) hypertension: Secondary | ICD-10-CM | POA: Diagnosis not present

## 2017-11-23 DIAGNOSIS — Z9119 Patient's noncompliance with other medical treatment and regimen: Secondary | ICD-10-CM | POA: Diagnosis not present

## 2017-11-23 DIAGNOSIS — E139 Other specified diabetes mellitus without complications: Secondary | ICD-10-CM | POA: Diagnosis not present

## 2017-12-05 DIAGNOSIS — E139 Other specified diabetes mellitus without complications: Secondary | ICD-10-CM | POA: Diagnosis not present

## 2017-12-05 DIAGNOSIS — K219 Gastro-esophageal reflux disease without esophagitis: Secondary | ICD-10-CM | POA: Diagnosis not present

## 2017-12-05 DIAGNOSIS — I10 Essential (primary) hypertension: Secondary | ICD-10-CM | POA: Diagnosis not present

## 2017-12-05 DIAGNOSIS — E7800 Pure hypercholesterolemia, unspecified: Secondary | ICD-10-CM | POA: Diagnosis not present

## 2017-12-05 DIAGNOSIS — I25810 Atherosclerosis of coronary artery bypass graft(s) without angina pectoris: Secondary | ICD-10-CM | POA: Diagnosis not present

## 2017-12-05 DIAGNOSIS — G894 Chronic pain syndrome: Secondary | ICD-10-CM | POA: Diagnosis not present

## 2017-12-05 DIAGNOSIS — F329 Major depressive disorder, single episode, unspecified: Secondary | ICD-10-CM | POA: Diagnosis not present

## 2017-12-14 DIAGNOSIS — H44511 Absolute glaucoma, right eye: Secondary | ICD-10-CM | POA: Diagnosis not present

## 2017-12-14 DIAGNOSIS — Z794 Long term (current) use of insulin: Secondary | ICD-10-CM | POA: Diagnosis not present

## 2017-12-14 DIAGNOSIS — E113312 Type 2 diabetes mellitus with moderate nonproliferative diabetic retinopathy with macular edema, left eye: Secondary | ICD-10-CM | POA: Diagnosis not present

## 2017-12-14 DIAGNOSIS — H2512 Age-related nuclear cataract, left eye: Secondary | ICD-10-CM | POA: Diagnosis not present

## 2017-12-14 DIAGNOSIS — H1830 Unspecified corneal membrane change: Secondary | ICD-10-CM | POA: Diagnosis not present

## 2017-12-14 DIAGNOSIS — E11311 Type 2 diabetes mellitus with unspecified diabetic retinopathy with macular edema: Secondary | ICD-10-CM | POA: Diagnosis not present

## 2017-12-17 ENCOUNTER — Encounter (HOSPITAL_COMMUNITY): Payer: Self-pay | Admitting: Emergency Medicine

## 2017-12-17 ENCOUNTER — Observation Stay (HOSPITAL_COMMUNITY)
Admission: EM | Admit: 2017-12-17 | Discharge: 2017-12-19 | Disposition: A | Discharge status: Home / Self Care | Payer: Medicare HMO | Attending: Internal Medicine | Admitting: Internal Medicine

## 2017-12-17 ENCOUNTER — Other Ambulatory Visit: Payer: Self-pay

## 2017-12-17 DIAGNOSIS — D696 Thrombocytopenia, unspecified: Secondary | ICD-10-CM | POA: Diagnosis present

## 2017-12-17 DIAGNOSIS — N183 Chronic kidney disease, stage 3 unspecified: Secondary | ICD-10-CM | POA: Diagnosis present

## 2017-12-17 DIAGNOSIS — I255 Ischemic cardiomyopathy: Secondary | ICD-10-CM | POA: Diagnosis not present

## 2017-12-17 DIAGNOSIS — Z79899 Other long term (current) drug therapy: Secondary | ICD-10-CM | POA: Insufficient documentation

## 2017-12-17 DIAGNOSIS — I2510 Atherosclerotic heart disease of native coronary artery without angina pectoris: Secondary | ICD-10-CM | POA: Insufficient documentation

## 2017-12-17 DIAGNOSIS — Z23 Encounter for immunization: Secondary | ICD-10-CM | POA: Diagnosis not present

## 2017-12-17 DIAGNOSIS — Z888 Allergy status to other drugs, medicaments and biological substances status: Secondary | ICD-10-CM | POA: Diagnosis not present

## 2017-12-17 DIAGNOSIS — E1159 Type 2 diabetes mellitus with other circulatory complications: Secondary | ICD-10-CM | POA: Diagnosis present

## 2017-12-17 DIAGNOSIS — K219 Gastro-esophageal reflux disease without esophagitis: Secondary | ICD-10-CM | POA: Insufficient documentation

## 2017-12-17 DIAGNOSIS — E785 Hyperlipidemia, unspecified: Secondary | ICD-10-CM | POA: Insufficient documentation

## 2017-12-17 DIAGNOSIS — Z87891 Personal history of nicotine dependence: Secondary | ICD-10-CM | POA: Insufficient documentation

## 2017-12-17 DIAGNOSIS — Z951 Presence of aortocoronary bypass graft: Secondary | ICD-10-CM | POA: Insufficient documentation

## 2017-12-17 DIAGNOSIS — E119 Type 2 diabetes mellitus without complications: Secondary | ICD-10-CM

## 2017-12-17 DIAGNOSIS — E1122 Type 2 diabetes mellitus with diabetic chronic kidney disease: Secondary | ICD-10-CM | POA: Diagnosis not present

## 2017-12-17 DIAGNOSIS — B9681 Helicobacter pylori [H. pylori] as the cause of diseases classified elsewhere: Secondary | ICD-10-CM | POA: Diagnosis not present

## 2017-12-17 DIAGNOSIS — Z794 Long term (current) use of insulin: Secondary | ICD-10-CM | POA: Insufficient documentation

## 2017-12-17 DIAGNOSIS — D62 Acute posthemorrhagic anemia: Secondary | ICD-10-CM | POA: Insufficient documentation

## 2017-12-17 DIAGNOSIS — K2970 Gastritis, unspecified, without bleeding: Secondary | ICD-10-CM | POA: Diagnosis not present

## 2017-12-17 DIAGNOSIS — I130 Hypertensive heart and chronic kidney disease with heart failure and stage 1 through stage 4 chronic kidney disease, or unspecified chronic kidney disease: Secondary | ICD-10-CM | POA: Diagnosis not present

## 2017-12-17 DIAGNOSIS — Z8249 Family history of ischemic heart disease and other diseases of the circulatory system: Secondary | ICD-10-CM | POA: Diagnosis not present

## 2017-12-17 DIAGNOSIS — K922 Gastrointestinal hemorrhage, unspecified: Secondary | ICD-10-CM | POA: Diagnosis present

## 2017-12-17 DIAGNOSIS — Z7902 Long term (current) use of antithrombotics/antiplatelets: Secondary | ICD-10-CM | POA: Diagnosis not present

## 2017-12-17 DIAGNOSIS — I10 Essential (primary) hypertension: Secondary | ICD-10-CM | POA: Diagnosis present

## 2017-12-17 DIAGNOSIS — Z7982 Long term (current) use of aspirin: Secondary | ICD-10-CM | POA: Diagnosis not present

## 2017-12-17 DIAGNOSIS — I252 Old myocardial infarction: Secondary | ICD-10-CM | POA: Diagnosis not present

## 2017-12-17 DIAGNOSIS — K259 Gastric ulcer, unspecified as acute or chronic, without hemorrhage or perforation: Secondary | ICD-10-CM | POA: Insufficient documentation

## 2017-12-17 DIAGNOSIS — E113599 Type 2 diabetes mellitus with proliferative diabetic retinopathy without macular edema, unspecified eye: Secondary | ICD-10-CM | POA: Diagnosis present

## 2017-12-17 DIAGNOSIS — I152 Hypertension secondary to endocrine disorders: Secondary | ICD-10-CM | POA: Diagnosis present

## 2017-12-17 LAB — CBC
HCT: 27.2 % — ABNORMAL LOW (ref 39.0–52.0)
HCT: 26 % — ABNORMAL LOW (ref 39.0–52.0)
HEMOGLOBIN: 8.5 g/dL — AB (ref 13.0–17.0)
Hemoglobin: 8.2 g/dL — ABNORMAL LOW (ref 13.0–17.0)
MCH: 27.9 pg (ref 26.0–34.0)
MCH: 28 pg (ref 26.0–34.0)
MCHC: 31.3 g/dL (ref 30.0–36.0)
MCHC: 31.5 g/dL (ref 30.0–36.0)
MCV: 89.2 fL (ref 80.0–100.0)
MCV: 88.7 fL (ref 80.0–100.0)
NRBC: 0 % (ref 0.0–0.2)
NRBC: 0 % (ref 0.0–0.2)
PLATELETS: 111 10*3/uL — AB (ref 150–400)
Platelets: 106 10*3/uL — ABNORMAL LOW (ref 150–400)
RBC: 3.05 MIL/uL — AB (ref 4.22–5.81)
RBC: 2.93 MIL/uL — AB (ref 4.22–5.81)
RDW: 13.7 % (ref 11.5–15.5)
RDW: 13.7 % (ref 11.5–15.5)
WBC: 8.7 10*3/uL (ref 4.0–10.5)
WBC: 7.8 10*3/uL (ref 4.0–10.5)

## 2017-12-17 LAB — MRSA PCR SCREENING: MRSA BY PCR: NEGATIVE

## 2017-12-17 LAB — CBC WITH DIFFERENTIAL/PLATELET
Abs Immature Granulocytes: 0.03 K/uL (ref 0.00–0.07)
Basophils Absolute: 0 K/uL (ref 0.0–0.1)
Basophils Relative: 0 %
Eosinophils Absolute: 0.1 K/uL (ref 0.0–0.5)
Eosinophils Relative: 1 %
HCT: 28.9 % — ABNORMAL LOW (ref 39.0–52.0)
Hemoglobin: 9.3 g/dL — ABNORMAL LOW (ref 13.0–17.0)
Immature Granulocytes: 0 %
Lymphocytes Relative: 8 %
Lymphs Abs: 0.8 K/uL (ref 0.7–4.0)
MCH: 28.8 pg (ref 26.0–34.0)
MCHC: 32.2 g/dL (ref 30.0–36.0)
MCV: 89.5 fL (ref 80.0–100.0)
Monocytes Absolute: 0.4 K/uL (ref 0.1–1.0)
Monocytes Relative: 4 %
Neutro Abs: 8.8 K/uL — ABNORMAL HIGH (ref 1.7–7.7)
Neutrophils Relative %: 87 %
Platelets: 122 K/uL — ABNORMAL LOW (ref 150–400)
RBC: 3.23 MIL/uL — ABNORMAL LOW (ref 4.22–5.81)
RDW: 13.5 % (ref 11.5–15.5)
WBC: 10.1 K/uL (ref 4.0–10.5)
nRBC: 0 % (ref 0.0–0.2)

## 2017-12-17 LAB — COMPREHENSIVE METABOLIC PANEL WITH GFR
ALT: 14 U/L (ref 0–44)
AST: 11 U/L — ABNORMAL LOW (ref 15–41)
Albumin: 3.1 g/dL — ABNORMAL LOW (ref 3.5–5.0)
Alkaline Phosphatase: 50 U/L (ref 38–126)
Anion gap: 5 (ref 5–15)
BUN: 58 mg/dL — ABNORMAL HIGH (ref 8–23)
CO2: 23 mmol/L (ref 22–32)
Calcium: 7.9 mg/dL — ABNORMAL LOW (ref 8.9–10.3)
Chloride: 109 mmol/L (ref 98–111)
Creatinine, Ser: 1.25 mg/dL — ABNORMAL HIGH (ref 0.61–1.24)
GFR calc Af Amer: >60 mL/min
GFR calc non Af Amer: 54 mL/min — ABNORMAL LOW
Glucose, Bld: 324 mg/dL — ABNORMAL HIGH (ref 70–99)
Potassium: 5 mmol/L (ref 3.5–5.1)
Sodium: 137 mmol/L (ref 135–145)
Total Bilirubin: 0.8 mg/dL (ref 0.3–1.2)
Total Protein: 5.5 g/dL — ABNORMAL LOW (ref 6.5–8.1)

## 2017-12-17 LAB — PROTIME-INR
INR: 1.2
Prothrombin Time: 15.1 s (ref 11.4–15.2)

## 2017-12-17 LAB — CBG MONITORING, ED: Glucose-Capillary: 244 mg/dL — ABNORMAL HIGH (ref 70–99)

## 2017-12-17 LAB — POC OCCULT BLOOD, ED: Fecal Occult Bld: POSITIVE — AB

## 2017-12-17 MED ORDER — SODIUM CHLORIDE 0.9 % IV SOLN
INTRAVENOUS | Status: DC
  Administered 2017-12-18: 100 mL/h via INTRAVENOUS

## 2017-12-17 MED ORDER — SODIUM CHLORIDE 0.9 % IV SOLN
8.0000 mg/h | INTRAVENOUS | Status: DC
  Administered 2017-12-17 – 2017-12-18 (×3): 8 mg/h via INTRAVENOUS
  Filled 2017-12-17 (×5): qty 80

## 2017-12-17 MED ORDER — INFLUENZA VAC SPLIT QUAD 0.5 ML IM SUSY
0.5000 mL | PREFILLED_SYRINGE | INTRAMUSCULAR | Status: AC
  Administered 2017-12-18: 0.5 mL via INTRAMUSCULAR
  Filled 2017-12-17: qty 0.5

## 2017-12-17 MED ORDER — PANTOPRAZOLE SODIUM 40 MG IV SOLR
INTRAVENOUS | Status: AC
  Filled 2017-12-17: qty 160

## 2017-12-17 MED ORDER — ONDANSETRON HCL 4 MG PO TABS: 4.0000 mg | ORAL_TABLET | Freq: Four times a day (QID) | ORAL | Status: DC | PRN

## 2017-12-17 MED ORDER — SODIUM CHLORIDE 0.9 % IV SOLN
80.0000 mg | Freq: Once | INTRAVENOUS | Status: AC
  Administered 2017-12-17: 80 mg via INTRAVENOUS
  Filled 2017-12-17: qty 80

## 2017-12-17 MED ORDER — INSULIN ASPART 100 UNIT/ML ~~LOC~~ SOLN
0.0000 [IU] | SUBCUTANEOUS | Status: DC
  Administered 2017-12-18: 2 [IU] via SUBCUTANEOUS
  Administered 2017-12-19: 1 [IU] via SUBCUTANEOUS
  Administered 2017-12-19: 2 [IU] via SUBCUTANEOUS
  Administered 2017-12-19: 3 [IU] via SUBCUTANEOUS

## 2017-12-17 MED ORDER — SODIUM CHLORIDE 0.9 % IV SOLN
INTRAVENOUS | Status: DC
  Administered 2017-12-17: 16:00:00 via INTRAVENOUS

## 2017-12-17 MED ORDER — ONDANSETRON HCL 4 MG/2ML IJ SOLN: 4.0000 mg | Freq: Four times a day (QID) | INTRAMUSCULAR | Status: DC | PRN

## 2017-12-17 MED ORDER — ONDANSETRON HCL 4 MG/2ML IJ SOLN
4.0000 mg | Freq: Once | INTRAMUSCULAR | Status: AC
  Administered 2017-12-17: 4 mg via INTRAVENOUS
  Filled 2017-12-17: qty 2

## 2017-12-17 NOTE — ED Notes (Signed)
Report to Sandy, RN ICU

## 2017-12-17 NOTE — Progress Notes (Signed)
Received call from ED. Patient admitted with UGI bleed, acute blood loss anemia, on Plavix. Hemodynamically stable. Stable in ED thus far since presentation. Admission in process. May have clear liquids this evening and NPO after midnight except for meds. Follow H/H. Will assess first thing in the morning and likely proceed with EGD by Dr. Darrick Penna. As of note, I reviewed past imaging and no evidence for advanced liver disease on recent US Aug 2019. Agree with Protonix drip already started.  Gelene Mink, PhD, ANP-BC [facility]

## 2017-12-17 NOTE — ED Triage Notes (Signed)
PT c/o vomiting black/bloody emesis this am and black colored bowel movements that started this am. PT is currently taking Plavix.

## 2017-12-17 NOTE — H&P (Addendum)
History and Physical    Ayden Hardwick ZOX:[id number] DOB: 1940-06-24 DOA: 12/17/2017  PCP: Samuel Jester, DO   Patient coming from: Home  Chief Complaint: Dark vomiting and stools  HPI: Charod Slawinski is a 77 y.o. male with medical history significant for ischemic cardiomyopathy with systolic CHF, DM2, HTN, CKD 3, presented to the ED with complaints of dark vomitus and watery stools with blood this morning.  No abdominal pain. Reports associated dizziness with standing.  No chest pain no difficulty breathing.  Reports remote history of peptic ulcer disease related to alcohol abuse.  Does not think he has had a colonoscopy. He is on Plavix and aspirin, last dose of both this morning.  He denies NSAID use.  ED Course: Systolic 100/57, heart rate 70s to 90.  Hemoglobin 9.3, drop from 14, 5 months ago.  Cr at baseline 1.25, with elevated BUN 58.  Glucose 324.  Protonix drip started in ED. IVF N/s started. GI Dr. Gerrit Halls, consulted, okay to stay here, plan EGD a.m.  Review of Systems: As per HPI all other systems reviewed and negative.  Past Medical History:  Diagnosis Date  . ARTERIOSCLEROSIS 02/10/2010  . CAD 02/25/2010  . DIABETES TYPE 2 02/10/2010  . HYPERLIPIDEMIA NOS 02/10/2010  . HYPERTENSION 02/10/2010  . INTERMITTENT CLAUDICATION 02/10/2010  . MI, old     Past Surgical History:  Procedure Laterality Date  . CORONARY ARTERY BYPASS GRAFT  2005   4 vessel bypass      reports that he has quit smoking. His smoking use included cigars. He quit smokeless tobacco use about 3 years ago.  His smokeless tobacco use included chew. He reports that he does not drink alcohol or use drugs.  Allergies  Allergen Reactions  . Crestor [Rosuvastatin Calcium]     myalgia    Family History  Problem Relation Age of Onset  . Diabetes Mother   . Heart disease Mother   . Diabetes Father   . Heart disease Father   . Stroke Father   . Diabetes Sister   . Diabetes Brother   . Hypertension  Other     Prior to Admission medications   Medication Sig Start Date End Date Taking? Authorizing Provider  aspirin EC 81 MG tablet Take 81 mg by mouth daily.   Yes [provider]  insulin NPH Human (NOVOLIN N) 100 UNIT/ML injection Inject 0.2 mLs (20 Units total) into the skin 2 (two) times daily before a meal. 09/26/17  Yes Tat, David, MD  Omega-3 Fatty Acids (FISH OIL PO) Take 1 capsule by mouth daily.   Yes [provider]  vitamin C (ASCORBIC ACID) 500 MG tablet Take 500 mg by mouth daily.   Yes [provider]  VITAMIN E PO Take 1 tablet by mouth daily.   Yes [provider]  atorvastatin (LIPITOR) 40 MG tablet Take 1 tablet (40 mg total) by mouth daily at 6 PM. 09/26/17   Tat, Onalee Hua, MD  carvedilol (COREG) 6.25 MG tablet Take 1 tablet (6.25 mg total) by mouth 2 (two) times daily with a meal. 09/26/17   Tat, Onalee Hua, MD  clopidogrel (PLAVIX) 75 MG tablet Take 75 mg by mouth daily.  12/11/15   [provider]  hydrALAZINE (APRESOLINE) 25 MG tablet Take 1 tablet (25 mg total) by mouth every 8 (eight) hours. 09/26/17   Catarina Hartshorn, MD  isosorbide mononitrate (IMDUR) 30 MG 24 hr tablet Take 30 mg by mouth daily.  12/11/15   [provider]  LORazepam (ATIVAN) 0.5 MG tablet Take 0.5 mg by mouth 2 (two) times daily.    [provider]  timolol (TIMOPTIC) 0.25 % ophthalmic solution Place 1 drop into both eyes.  08/01/11   [provider]    Physical Exam: Vitals:   12/17/17 1326 12/17/17 1351 12/17/17 1530 12/17/17 1600  BP:   100/64 111/65  Pulse:   73   Resp:  18 (!) 22 (!) 23  Temp:      TempSrc:      SpO2:   98%   Weight: 93 kg     Height: 5\' 10"  (1.778 m)       Constitutional: NAD, calm, comfortable Vitals:   12/17/17 1326 12/17/17 1351 12/17/17 1530 12/17/17 1600  BP:   100/64 111/65  Pulse:   73   Resp:  18 (!) 22 (!) 23  Temp:      TempSrc:      SpO2:   98%   Weight: 93 kg     Height: 5\' 10"  (1.778 m)       Eyes: PERRL, lids and conjunctivae normal ENMT: Mucous membranes are dry. Posterior pharynx clear of any exudate or lesions.  Neck: normal, supple, no masses, no thyromegaly Respiratory: clear to auscultation bilaterally, no wheezing, no crackles. Normal respiratory effort. No accessory muscle use.  Cardiovascular: Regular rate and rhythm, no murmurs / rubs / gallops. No extremity edema. 2+ pedal pulses. No carotid bruits.  Abdomen: no tenderness, no masses palpated. No hepatosplenomegaly. Bowel sounds positive.  Musculoskeletal: no clubbing / cyanosis. No joint deformity upper and lower extremities. Good ROM, no contractures. Normal muscle tone.  Skin: no rashes, lesions, ulcers. No induration Neurologic: CN 2-12 grossly intact. Strength 5/5 in all 4.  Psychiatric: Normal judgment and insight. Alert and oriented x 3. Normal mood.   Labs on Admission: I have personally reviewed following labs and imaging studies  CBC: Recent Labs  Lab 12/17/17 1422  WBC 10.1  NEUTROABS 8.8*  HGB 9.3*  HCT 28.9*  MCV 89.5  PLT 122*   Basic Metabolic Panel: Recent Labs  Lab 12/17/17 1422  NA 137  K 5.0  CL 109  CO2 23  GLUCOSE 324*  BUN 58*  CREATININE 1.25*  CALCIUM 7.9*   Liver Function Tests: Recent Labs  Lab 12/17/17 1422  AST 11*  ALT 14  ALKPHOS 50  BILITOT 0.8  PROT 5.5*  ALBUMIN 3.1*   Coagulation Profile: Recent Labs  Lab 12/17/17 1422  INR 1.20   Radiological Exams on Admission: No results found.  EKG: None.  Assessment/Plan Active Problems:   Essential hypertension   Diabetes (HCC)   Ischemic cardiomyopathy   Type 2 diabetes mellitus with proliferative retinopathy (HCC)   Thrombocytopenia (HCC)   CKD stage 3 due to type 2 diabetes mellitus (HCC)  GI bleed-likely upper, with dark vomitus and bloody/dark stools. Hgb 9 down from 14,  2 months ago, elevated BUN 58. soft blood pressure systolic 100/57.  On aspirin and Plavix- last dose this morning, 10 AM.  Denies NSAID or alcohol use.  Doubt prior colonoscopy.  Remote PUD history.  - Given relative hypotension, dizziness on standing, 5 point drop in hemoglobin will admit to stepdown. - Continue Protonix drip started in ED - CBC q6H X 3 - IVF N/s 100cc/hr x 12 hrs - NPO - GI consulted in ED, they will see patient. -Hold aspirin and Plavix  Ischemic cardiomyopathy-CABG 2005.  Last echo 09/2017-EF 60 to 65%,  G2DD. -Dual antiplatelet aspirin and Plavix hold for now  HTN-soft blood pressure, systolic low 100s. -Hold antihypertensives hydralazine Imdur and Coreg, in the setting of GI bleed.  DM2- Glucose 324. 09/2017 Hgba1c-7.8.  Reports he took 40 units of NPH prior to arrival.  - SSi q6h -Hold home NPH while n.p.o.  CKD 3-creatinine at baseline 1.25.  BUN elevated 58 likely from upper GI bleed.   Chronic thrombocytopenia-baseline 122.  Recent admission TSH B12 coags fibrinogen all normal.  Abdominal ultrasound showed hepatic steatosis.   DVT prophylaxis: SCds Code Status: Full Family Communication: None at bedside Disposition Plan: Per rounding team Consults called: GI Admission status: obs, step down   Onnie Boer MD [facility] Pager [phone] From 3PM-11PM.  Otherwise please contact night-coverage [email or web address] Password [email or web address]  12/17/2017, 5:10 PM

## 2017-12-17 NOTE — ED Notes (Signed)
Continue to await med

## 2017-12-17 NOTE — ED Notes (Signed)
Pt lives alone Awakened this am and had a dark, tarry stool  Then vomited dark stuff per pt   Pt is on thinners and denies taking too many or too few meds from home

## 2017-12-17 NOTE — ED Notes (Signed)
Call to AC for meds 

## 2017-12-17 NOTE — ED Provider Notes (Signed)
[facility] EMERGENCY DEPARTMENT Provider Note   CSN: [id number] Arrival date & time: 12/17/17  1316     History   Chief Complaint Chief Complaint  Patient presents with  . GI Bleeding    HPI Trevor Wilkie is a 77 y.o. male.  HPI Patient presents to the emergency room with complaints of nausea vomiting and dark stools.  Patient states he started having nausea and vomiting this morning.  It was dark in color.  He also had a very dark-colored stool.  Patient has been feeling weak today.  His skin looks pale.  He feels lightheaded when standing.  He denies any trouble with any chest pain or shortness of breath.  He does not have any prior history of intestinal bleeding.  Patient does take Plavix.  Denies any abdominal pain.  No fevers or chills. Past Medical History:  Diagnosis Date  . ARTERIOSCLEROSIS 02/10/2010  . CAD 02/25/2010  . DIABETES TYPE 2 02/10/2010  . HYPERLIPIDEMIA NOS 02/10/2010  . HYPERTENSION 02/10/2010  . INTERMITTENT CLAUDICATION 02/10/2010  . MI, old     Patient Active Problem List   Diagnosis Date Noted  . CKD stage 3 due to type 2 diabetes mellitus (HCC) 09/25/2017  . Dehydration 09/24/2017  . Ischemic cardiomyopathy 09/24/2017  . Chronic systolic CHF (congestive heart failure) (HCC) 09/24/2017  . Type 2 diabetes mellitus with proliferative retinopathy (HCC) 09/24/2017  . Thrombocytopenia (HCC) 09/24/2017  . Hypotension   . Near syncope   . Diabetes (HCC) 06/09/2015  . Coronary atherosclerosis 02/25/2010  . HYPERLIPIDEMIA NOS 02/10/2010  . Essential hypertension 02/10/2010  . ARTERIOSCLEROSIS 02/10/2010  . INTERMITTENT CLAUDICATION 02/10/2010    Past Surgical History:  Procedure Laterality Date  . CORONARY ARTERY BYPASS GRAFT  2005   4 vessel bypass         Home Medications    Prior to Admission medications   Medication Sig Start Date End Date Taking? Authorizing Provider  aspirin EC 81 MG tablet Take 81 mg by mouth daily.   Yes [provider]  insulin NPH Human (NOVOLIN N) 100 UNIT/ML injection Inject 0.2 mLs (20 Units total) into the skin 2 (two) times daily before a meal. 09/26/17  Yes Tat, David, MD  Omega-3 Fatty Acids (FISH OIL PO) Take 1 capsule by mouth daily.   Yes [provider]  vitamin C (ASCORBIC ACID) 500 MG tablet Take 500 mg by mouth daily.   Yes [provider]  VITAMIN E PO Take 1 tablet by mouth daily.   Yes [provider]  atorvastatin (LIPITOR) 40 MG tablet Take 1 tablet (40 mg total) by mouth daily at 6 PM. 09/26/17   Tat, Onalee Hua, MD  carvedilol (COREG) 6.25 MG tablet Take 1 tablet (6.25 mg total) by mouth 2 (two) times daily with a meal. 09/26/17   Tat, Onalee Hua, MD  clopidogrel (PLAVIX) 75 MG tablet Take 75 mg by mouth daily.  12/11/15   [provider]  hydrALAZINE (APRESOLINE) 25 MG tablet Take 1 tablet (25 mg total) by mouth every 8 (eight) hours. 09/26/17   Catarina Hartshorn, MD  isosorbide mononitrate (IMDUR) 30 MG 24 hr tablet Take 30 mg by mouth daily.  12/11/15   [provider]  LORazepam (ATIVAN) 0.5 MG tablet Take 0.5 mg by mouth 2 (two) times daily.    [provider]  timolol (TIMOPTIC) 0.25 % ophthalmic solution Place 1 drop into both eyes.  08/01/11   [provider]    Family History  Family History  Problem Relation Age of Onset  . Diabetes Mother   . Heart disease Mother   . Diabetes Father   . Heart disease Father   . Stroke Father   . Diabetes Sister   . Diabetes Brother   . Hypertension Other     Social History Social History   Tobacco Use  . Smoking status: Former Smoker    Types: Cigars  . Smokeless tobacco: Former Neurosurgeon    Types: Chew    Quit date: 02/21/2014  Substance Use Topics  . Alcohol use: No  . Drug use: No     Allergies   Crestor [rosuvastatin calcium]   Review of Systems Review of Systems  All other systems reviewed and are negative.    Physical Exam Updated Vital Signs BP 127/67   Pulse  73   Temp 97.7 F (36.5 C) (Oral)   Resp 17   Ht 1.778 m (5\' 10" )   Wt 93 kg   SpO2 98%   BMI 29.41 kg/m   Physical Exam  Constitutional: He appears well-developed and well-nourished. No distress.  HENT:  Head: Normocephalic and atraumatic.  Right Ear: External ear normal.  Left Ear: External ear normal.  Eyes: Conjunctivae are normal. Right eye exhibits no discharge. Left eye exhibits no discharge. No scleral icterus.  Neck: Neck supple. No tracheal deviation present.  Cardiovascular: Normal rate, regular rhythm and intact distal pulses.  Pulmonary/Chest: Effort normal and breath sounds normal. No stridor. No respiratory distress. He has no wheezes. He has no rales.  Abdominal: Soft. Bowel sounds are normal. He exhibits no distension. There is no tenderness. There is no rebound and no guarding.  Genitourinary:  Genitourinary Comments: Dark-colored stool, Hemoccult positive  Musculoskeletal: He exhibits no edema or tenderness.  Neurological: He is alert. He has normal strength. No cranial nerve deficit (no facial droop, extraocular movements intact, no slurred speech) or sensory deficit. He exhibits normal muscle tone. He displays no seizure activity. Coordination normal.  Skin: Skin is warm and dry. No rash noted. There is pallor.  Psychiatric: He has a normal mood and affect.  Nursing note and vitals reviewed.    ED Treatments / Results  Labs (all labs ordered are listed, but only abnormal results are displayed) Labs Reviewed  COMPREHENSIVE METABOLIC PANEL - Abnormal; Notable for the following components:      Result Value   Glucose, Bld 324 (*)    BUN 58 (*)    Creatinine, Ser 1.25 (*)    Calcium 7.9 (*)    Total Protein 5.5 (*)    Albumin 3.1 (*)    AST 11 (*)    GFR calc non Af Amer 54 (*)    All other components within normal limits  CBC WITH DIFFERENTIAL/PLATELET - Abnormal; Notable for the following components:   RBC 3.23 (*)    Hemoglobin 9.3 (*)    HCT 28.9  (*)    Platelets 122 (*)    Neutro Abs 8.8 (*)    All other components within normal limits  PROTIME-INR  POC OCCULT BLOOD, ED  TYPE AND SCREEN    EKG None  Procedures .Critical Care Performed by: Linwood Dibbles, MD Authorized by: Linwood Dibbles, MD   Critical care provider statement:    Critical care time (minutes):  35   Critical care was time spent personally by me on the following activities:  Discussions with consultants, evaluation of patient's response to treatment, examination of patient, ordering and performing treatments  and interventions, ordering and review of laboratory studies, ordering and review of radiographic studies, pulse oximetry, re-evaluation of patient's condition, obtaining history from patient or surrogate and review of old charts   (including critical care time)  Medications Ordered in ED Medications  0.9 %  sodium chloride infusion ( Intravenous New Bag/Given 12/17/17 1552)  pantoprazole (PROTONIX) 80 mg in sodium chloride 0.9 % 100 mL IVPB (has no administration in time range)  pantoprazole (PROTONIX) 80 mg in sodium chloride 0.9 % 250 mL (0.32 mg/mL) infusion (has no administration in time range)  ondansetron (ZOFRAN) injection 4 mg (4 mg Intravenous Given 12/17/17 1553)     Initial Impression / Assessment and Plan / ED Course  I have reviewed the triage vital signs and the nursing notes.  Pertinent labs & imaging results that were available during my care of the patient were reviewed by me and considered in my medical decision making (see chart for details).  Clinical Course as of Dec 18 1651  Sun Dec 17, 2017  1534 Patient symptoms are concerning for an acute upper GI bleed.   [JK]  1534 She has a significant drop in his hemoglobin compared to 2 months ago.   [JK]  1534 BUN is also elevated consistent with an upper GI bleed.   [JK]  1535 No indication for blood transfusion at this time although we will monitor closely.  I will consult with  gastroenterology.  I will consult the medical service for admission   [JK]  1652 D/w Lewie Loron, GI.  Plan on EGD tomorrow.  NPO past midnight.   [JK]    Clinical Course User Index [JK] Linwood Dibbles, MD    Patient presented to the emergency room with symptoms concerning for an upper GI bleed.  Laboratory tests do confirm blood in his stool as well as a new anemia associated with an elevated BUN.  Patient has remained stable in the emergency room.  No signs of active hematemesis.  Plan on consultation with gastroenterology and admit to the hospital for further treatment and evaluation.  Final Clinical Impressions(s) / ED Diagnoses   Final diagnoses:  Acute upper GI bleed      Linwood Dibbles, MD 12/17/17 [phone]

## 2017-12-18 ENCOUNTER — Encounter (HOSPITAL_COMMUNITY)
Admission: EM | Disposition: A | Discharge status: Home / Self Care | Payer: Self-pay | Source: Home / Self Care | Attending: Emergency Medicine

## 2017-12-18 ENCOUNTER — Other Ambulatory Visit: Payer: Self-pay

## 2017-12-18 ENCOUNTER — Encounter (HOSPITAL_COMMUNITY): Payer: Self-pay | Admitting: Gastroenterology

## 2017-12-18 DIAGNOSIS — B9681 Helicobacter pylori [H. pylori] as the cause of diseases classified elsewhere: Secondary | ICD-10-CM | POA: Diagnosis not present

## 2017-12-18 DIAGNOSIS — E1122 Type 2 diabetes mellitus with diabetic chronic kidney disease: Secondary | ICD-10-CM

## 2017-12-18 DIAGNOSIS — K922 Gastrointestinal hemorrhage, unspecified: Secondary | ICD-10-CM | POA: Diagnosis not present

## 2017-12-18 DIAGNOSIS — K259 Gastric ulcer, unspecified as acute or chronic, without hemorrhage or perforation: Secondary | ICD-10-CM

## 2017-12-18 DIAGNOSIS — N183 Chronic kidney disease, stage 3 (moderate): Secondary | ICD-10-CM | POA: Diagnosis not present

## 2017-12-18 DIAGNOSIS — E113592 Type 2 diabetes mellitus with proliferative diabetic retinopathy without macular edema, left eye: Secondary | ICD-10-CM

## 2017-12-18 DIAGNOSIS — I10 Essential (primary) hypertension: Secondary | ICD-10-CM | POA: Diagnosis not present

## 2017-12-18 DIAGNOSIS — E1151 Type 2 diabetes mellitus with diabetic peripheral angiopathy without gangrene: Secondary | ICD-10-CM | POA: Diagnosis not present

## 2017-12-18 DIAGNOSIS — D696 Thrombocytopenia, unspecified: Secondary | ICD-10-CM

## 2017-12-18 DIAGNOSIS — I255 Ischemic cardiomyopathy: Secondary | ICD-10-CM

## 2017-12-18 DIAGNOSIS — K2970 Gastritis, unspecified, without bleeding: Secondary | ICD-10-CM

## 2017-12-18 DIAGNOSIS — K2951 Unspecified chronic gastritis with bleeding: Secondary | ICD-10-CM | POA: Diagnosis not present

## 2017-12-18 DIAGNOSIS — Z794 Long term (current) use of insulin: Secondary | ICD-10-CM

## 2017-12-18 HISTORY — PX: BIOPSY: SHX5522

## 2017-12-18 HISTORY — PX: ESOPHAGOGASTRODUODENOSCOPY: SHX5428

## 2017-12-18 LAB — CBC
HCT: 24.4 % — ABNORMAL LOW (ref 39.0–52.0)
HCT: 26.4 % — ABNORMAL LOW (ref 39.0–52.0)
HEMOGLOBIN: 7.6 g/dL — AB (ref 13.0–17.0)
Hemoglobin: 8.4 g/dL — ABNORMAL LOW (ref 13.0–17.0)
MCH: 27.5 pg (ref 26.0–34.0)
MCH: 27.9 pg (ref 26.0–34.0)
MCHC: 31.1 g/dL (ref 30.0–36.0)
MCHC: 31.8 g/dL (ref 30.0–36.0)
MCV: 88.4 fL (ref 80.0–100.0)
MCV: 87.7 fL (ref 80.0–100.0)
NRBC: 0 % (ref 0.0–0.2)
PLATELETS: 91 10*3/uL — AB (ref 150–400)
Platelets: 98 10*3/uL — ABNORMAL LOW (ref 150–400)
RBC: 2.76 MIL/uL — ABNORMAL LOW (ref 4.22–5.81)
RBC: 3.01 MIL/uL — AB (ref 4.22–5.81)
RDW: 13.7 % (ref 11.5–15.5)
RDW: 14.5 % (ref 11.5–15.5)
WBC: 7.7 10*3/uL (ref 4.0–10.5)
WBC: 6.3 10*3/uL (ref 4.0–10.5)
nRBC: 0 % (ref 0.0–0.2)

## 2017-12-18 LAB — ABO/RH: ABO/RH(D): O POS

## 2017-12-18 LAB — GLUCOSE, CAPILLARY: Glucose-Capillary: 81 mg/dL (ref 70–99)

## 2017-12-18 LAB — PREPARE RBC (CROSSMATCH)

## 2017-12-18 SURGERY — EGD (ESOPHAGOGASTRODUODENOSCOPY)
Anesthesia: Moderate Sedation

## 2017-12-18 MED ORDER — DEXTROSE 50 % IV SOLN
INTRAVENOUS | Status: AC
  Filled 2017-12-18: qty 50

## 2017-12-18 MED ORDER — DICLOFENAC SODIUM 1 % TD GEL
4.0000 g | Freq: Four times a day (QID) | TRANSDERMAL | Status: DC
  Administered 2017-12-18 – 2017-12-19 (×3): 4 g via TOPICAL
  Filled 2017-12-18: qty 100

## 2017-12-18 MED ORDER — SODIUM CHLORIDE 0.9 % IV SOLN
8.0000 mg/h | INTRAVENOUS | Status: AC
  Administered 2017-12-18: 8 mg/h via INTRAVENOUS
  Filled 2017-12-18: qty 80

## 2017-12-18 MED ORDER — DEXTROSE 50 % IV SOLN
25.0000 mL | Freq: Once | INTRAVENOUS | Status: AC
  Administered 2017-12-18: 25 mL via INTRAVENOUS

## 2017-12-18 MED ORDER — MEPERIDINE HCL 100 MG/ML IJ SOLN
INTRAMUSCULAR | Status: DC | PRN
  Administered 2017-12-18 (×2): 25 mg via INTRAVENOUS

## 2017-12-18 MED ORDER — LIDOCAINE VISCOUS HCL 2 % MT SOLN
OROMUCOSAL | Status: AC
  Filled 2017-12-18: qty 15

## 2017-12-18 MED ORDER — PANTOPRAZOLE SODIUM 40 MG PO TBEC
40.0000 mg | DELAYED_RELEASE_TABLET | Freq: Two times a day (BID) | ORAL | Status: DC
  Administered 2017-12-19: 40 mg via ORAL
  Filled 2017-12-18: qty 1

## 2017-12-18 MED ORDER — DEXTROSE 50 % IV SOLN
INTRAVENOUS | Status: AC
  Administered 2017-12-18: 25 mL via INTRAVENOUS
  Filled 2017-12-18: qty 50

## 2017-12-18 MED ORDER — MIDAZOLAM HCL 5 MG/5ML IJ SOLN
INTRAMUSCULAR | Status: DC | PRN
  Administered 2017-12-18 (×3): 1 mg via INTRAVENOUS

## 2017-12-18 MED ORDER — SODIUM CHLORIDE 0.9 % IV SOLN: INTRAVENOUS | Status: DC

## 2017-12-18 MED ORDER — MEPERIDINE HCL 100 MG/ML IJ SOLN
INTRAMUSCULAR | Status: AC
  Filled 2017-12-18: qty 1

## 2017-12-18 MED ORDER — PANTOPRAZOLE SODIUM 40 MG IV SOLR
INTRAVENOUS | Status: AC
  Filled 2017-12-18: qty 80

## 2017-12-18 MED ORDER — LIDOCAINE VISCOUS HCL 2 % MT SOLN
OROMUCOSAL | Status: DC | PRN
  Administered 2017-12-18: 1 via OROMUCOSAL

## 2017-12-18 MED ORDER — POTASSIUM CHLORIDE 2 MEQ/ML IV SOLN: INTRAVENOUS | Status: DC

## 2017-12-18 MED ORDER — SODIUM CHLORIDE 0.9% IV SOLUTION
Freq: Once | INTRAVENOUS | Status: AC
  Administered 2017-12-18: 13:00:00 via INTRAVENOUS

## 2017-12-18 MED ORDER — KCL IN DEXTROSE-NACL 20-5-0.45 MEQ/L-%-% IV SOLN
INTRAVENOUS | Status: DC
  Administered 2017-12-18 (×2): via INTRAVENOUS

## 2017-12-18 MED ORDER — MIDAZOLAM HCL 5 MG/5ML IJ SOLN
INTRAMUSCULAR | Status: AC
  Filled 2017-12-18: qty 5

## 2017-12-18 MED ORDER — STERILE WATER FOR IRRIGATION IR SOLN
Status: DC | PRN
  Administered 2017-12-18: 1.5 mL

## 2017-12-18 NOTE — H&P (View-Only) (Signed)
  Referring Provider: Dr. Jon Knapp, [facility] ED Primary Care Physician:  Butler, Cynthia, DO Primary Gastroenterologist:  Dr. Fields   Date of Admission: 12/17/17 Date of Consultation: 12/18/17  Reason for Consultation:  GI bleed   HPI:  Steven Osborne is a 77 y.o. year old male presenting with coffee-ground emesis and melena, onset yesterday morning around 6am. Reported waking up and feeling nauseated, then vomited large amount of black emesis. He then had multiple bouts of dark, tarry stool, and presented to the ED thereafter. Baseline Hgb 14.8 in Aug 2019. Presenting with Hgb 9.3 on admission yesterday, down to 7.6 this morning. Last episode of melena this morning, small amount. No further hematemesis. Hemodynamically stable. He has been on Plavix and aspirin 81 mg as an outpatient. Both on hold currently.   Denies abdominal pain. No further N/V. No prior EGD. No prior colonoscopy. Reports drinking alcohol as a teen to his late 20s, but no alcohol since that time. States he was told he had an ulcer in his late 20s after an "xray". He has occasional reflux and will take omeprazole; however, he does not take a PPI daily. No OTC NSAIDs. Denies dysphagia. No chest pain, shortness of breath. Denies fatigue. Has a significant other who he sees a few days a week.   Past Medical History:  Diagnosis Date  . ARTERIOSCLEROSIS 02/10/2010  . CAD 02/25/2010  . DIABETES TYPE 2 02/10/2010  . HYPERLIPIDEMIA NOS 02/10/2010  . HYPERTENSION 02/10/2010  . INTERMITTENT CLAUDICATION 02/10/2010  . MI, old     Past Surgical History:  Procedure Laterality Date  . CORONARY ARTERY BYPASS GRAFT  2005   4 vessel bypass     Prior to Admission medications   Medication Sig Start Date End Date Taking? Authorizing Provider  aspirin EC 81 MG tablet Take 81 mg by mouth daily.   Yes [provider]  insulin NPH Human (NOVOLIN N) 100 UNIT/ML injection Inject 0.2 mLs (20 Units total) into the skin 2 (two) times  daily before a meal. 09/26/17  Yes Tat, David, MD  Omega-3 Fatty Acids (FISH OIL PO) Take 1 capsule by mouth daily.   Yes [provider]  vitamin C (ASCORBIC ACID) 500 MG tablet Take 500 mg by mouth daily.   Yes [provider]  VITAMIN E PO Take 1 tablet by mouth daily.   Yes [provider]  atorvastatin (LIPITOR) 40 MG tablet Take 1 tablet (40 mg total) by mouth daily at 6 PM. 09/26/17   Tat, David, MD  carvedilol (COREG) 6.25 MG tablet Take 1 tablet (6.25 mg total) by mouth 2 (two) times daily with a meal. 09/26/17   Tat, David, MD  clopidogrel (PLAVIX) 75 MG tablet Take 75 mg by mouth daily.  12/11/15   [provider]  hydrALAZINE (APRESOLINE) 25 MG tablet Take 1 tablet (25 mg total) by mouth every 8 (eight) hours. 09/26/17   Tat, David, MD  isosorbide mononitrate (IMDUR) 30 MG 24 hr tablet Take 30 mg by mouth daily.  12/11/15   [provider]  LORazepam (ATIVAN) 0.5 MG tablet Take 0.5 mg by mouth 2 (two) times daily.    [provider]  timolol (TIMOPTIC) 0.25 % ophthalmic solution Place 1 drop into both eyes.  08/01/11   [provider]    Current Facility-Administered Medications  Medication Dose Route Frequency Provider Last Rate Last Dose  . 0.9 %  sodium chloride infusion   Intravenous Continuous Emokpae, Ejiroghene E, MD   100 mL/hr at 12/18/17 0500    . Influenza vac split quadrivalent PF (FLUARIX) injection 0.5 mL  0.5 mL Intramuscular Tomorrow-1000 Emokpae, Ejiroghene E, MD      . insulin aspart (novoLOG) injection 0-9 Units  0-9 Units Subcutaneous Q4H Emokpae, Ejiroghene E, MD      . ondansetron (ZOFRAN) tablet 4 mg  4 mg Oral Q6H PRN Emokpae, Ejiroghene E, MD       Or  . ondansetron (ZOFRAN) injection 4 mg  4 mg Intravenous Q6H PRN Emokpae, Ejiroghene E, MD      . pantoprazole (PROTONIX) 80 mg in sodium chloride 0.9 % 250 mL (0.32 mg/mL) infusion  8 mg/hr Intravenous Continuous Emokpae, Ejiroghene E, MD 25 mL/hr at 12/18/17  0500 8 mg/hr at 12/18/17 0500    Allergies as of 12/17/2017 - Review Complete 12/17/2017  Allergen Reaction Noted  . Crestor [rosuvastatin calcium]  07/23/2010    Family History  Problem Relation Age of Onset  . Diabetes Mother   . Heart disease Mother   . Diabetes Father   . Heart disease Father   . Stroke Father   . Diabetes Sister   . Diabetes Brother   . Hypertension Other   . Colon cancer Neg Hx   . Colon polyps Neg Hx     Social History   Socioeconomic History  . Marital status: Divorced    Spouse name: Not on file  . Number of children: Not on file  . Years of education: Not on file  . Highest education level: Not on file  Occupational History  . Not on file  Social Needs  . Financial resource strain: Not on file  . Food insecurity:    Worry: Not on file    Inability: Not on file  . Transportation needs:    Medical: Not on file    Non-medical: Not on file  Tobacco Use  . Smoking status: Former Smoker    Types: Cigars  . Smokeless tobacco: Former User    Types: Chew    Quit date: 02/21/2014  Substance and Sexual Activity  . Alcohol use: No    Comment: as a teenager to late 20s, none since   . Drug use: No  . Sexual activity: Not on file  Lifestyle  . Physical activity:    Days per week: Not on file    Minutes per session: Not on file  . Stress: Not on file  Relationships  . Social connections:    Talks on phone: Not on file    Gets together: Not on file    Attends religious service: Not on file    Active member of club or organization: Not on file    Attends meetings of clubs or organizations: Not on file    Relationship status: Not on file  . Intimate partner violence:    Fear of current or ex partner: Not on file    Emotionally abused: Not on file    Physically abused: Not on file    Forced sexual activity: Not on file  Other Topics Concern  . Not on file  Social History Narrative   Divorced, lives alone   Regular exercise-yes     Review of Systems: As mentioned in HPI   Physical Exam: Vital signs in last 24 hours: Temp:  [97.5 F (36.4 C)-98.2 F (36.8 C)] 97.5 F (36.4 C) (10/28 0756) Pulse Rate:  [58-90] 81 (10/28 0800) Resp:  [0-31] 18 (10/28 0800) BP: (100-188)/(53-138) 158/90 (10/28   0800) SpO2:  [90 %-99 %] 99 % (10/28 0800) Weight:  [89.1 kg-93.4 kg] 93.4 kg (10/28 0500) Last BM Date: 12/17/17 General:   Alert,  Well-developed, well-nourished, pleasant and cooperative in NAD Head:  Normocephalic and atraumatic. Eyes:  Sclera clear, no icterus.   Conjunctiva pink. Ears:  Normal auditory acuity. Nose:  No deformity, discharge,  or lesions. Mouth:  No deformity or lesions Lungs:  Clear throughout to auscultation.   Heart:  S1 S2 present without murmurs  Abdomen:  Soft, nontender and nondistended. No masses, hepatosplenomegaly or hernias noted. Normal bowel sounds, without guarding, and without rebound.   Rectal:  Deferred until time of colonoscopy.   Msk:  Symmetrical without gross deformities. Normal posture. Extremities:  Without  edema. Neurologic:  Alert and  oriented x4 Skin:  Intact without significant lesions or rashes. Psych:  Alert and cooperative. Normal mood and affect.  Intake/Output from previous day: 10/27 0701 - 10/28 0700 In: 884.8 [I.V.:884.8] Out: -  Intake/Output this shift: No intake/output data recorded.  Lab Results: Recent Labs    12/17/17 1731 12/17/17 2321 12/18/17 0704  WBC 8.7 7.8 7.7  HGB 8.5* 8.2* 7.6*  HCT 27.2* 26.0* 24.4*  PLT 111* 106* 98*   BMET Recent Labs    12/17/17 1422  NA 137  K 5.0  CL 109  CO2 23  GLUCOSE 324*  BUN 58*  CREATININE 1.25*  CALCIUM 7.9*   LFT Recent Labs    12/17/17 1422  PROT 5.5*  ALBUMIN 3.1*  AST 11*  ALT 14  ALKPHOS 50  BILITOT 0.8   PT/INR Recent Labs    12/17/17 1422  LABPROT 15.1  INR 1.20    Impression: 77-year-old pleasant male presenting with hematemesis and melena in setting of Plavix  and 81 mg aspirin, no PPI, denying any other NSAIDs. No further hematemesis and last episode of melena small amount this morning. Baseline Hgb in the 14 range several months ago, presenting with acute blood loss anemia and Hgb 9 range on admission, now down to 7.6. Clinically asymptomatic but anticipate will continue to drift due to continued bleeding. Discussed with Dr. Memon and agree with 1 unit PRBCs.   Will need diagnostic EGD with Dr. Fields. I discussed risks and benefits with patient who stated understanding.  As of note, he has chronic thrombocytopenia but no evidence of liver disease on recent ultrasound in Aug 2019. Platelets 98 this morning. INR 1.2.   Plan: Remain NPO Continue Protonix infusion Agree with 1 unit PRBCs Agree with holding plavix and aspirin EGD with Dr. Fields likely today, with PRBCs started prior to EGD   Steven Near W. Deasiah Hagberg, PhD, ANP-BC [facility]     LOS: 0 days    12/18/2017, 8:41 AM    

## 2017-12-18 NOTE — Progress Notes (Signed)
Hypoglycemic Event  CBG: 55  Treatment: Administered 25 ml of IV Dextrose  Symptoms: None  Follow-up CBG: [id number]   CBG Result: 120  Possible Reasons for Event: NPO  Comments/MD notified: Dr Henreitta Leber

## 2017-12-18 NOTE — Care Management Obs Status (Signed)
MEDICARE OBSERVATION STATUS NOTIFICATION   Patient Details  Name: Steven Osborne MRN: [id number] Date of Birth: 03-May-1940   Medicare Observation Status Notification Given:       Renie Ora 12/18/2017, 12:23 PM

## 2017-12-18 NOTE — Progress Notes (Signed)
Hypoglycemic Event  CBG: 63  Treatment: Administered 25 mL of dextrose IV  Symptoms: none  Follow-up CBG: Time: 0415  CBG Result: 101  Possible Reasons for Event: NPO  Comments/MD notified:    Marvis Repress

## 2017-12-18 NOTE — Progress Notes (Signed)
PROGRESS NOTE    Steven Osborne  WUJ:[id number] DOB: Jan 12, 1941 DOA: 12/17/2017 PCP: Samuel Jester, DO    Brief Narrative:  77 year old male with a history of heart disease, diabetes, hypertension, who was chronically on antiplatelet therapy, presents with dark vomitus and watery stools.  He was found to have GI bleeding was admitted for further treatment.  GI following and plans on endoscopy later today.   Assessment & Plan:   Active Problems:   Essential hypertension   Diabetes (HCC)   Ischemic cardiomyopathy   Type 2 diabetes mellitus with proliferative retinopathy (HCC)   Thrombocytopenia (HCC)   CKD stage 3 due to type 2 diabetes mellitus (HCC)   GI bleed   Acute upper GI bleed   1. Acute upper GI bleed.  Patient complains of ongoing melena.  Hemoglobin has trended down.  He is chronically on aspirin and Plavix.  Antiplatelet therapy is currently on hold.  GI following plans for endoscopy today.  He is on Protonix infusion. 2. Acute blood loss anemia.  Secondary to GI bleeding.  Will transfuse 1 unit of PRBC. 3. Ischemic cardiomyopathy.  Ejection fraction 60 to 65% with grade 2 diastolic dysfunction.  No complaints of chest pain.  Will resume antiplatelet therapy when cleared by GI. 4. Diabetes.  NPH currently on hold.  Blood sugars have been running low since he been n.p.o. overnight.  Continue on sliding scale insulin.  Will start on D5 infusion since he is required several pushes of D50. 5. CKD stage III.  Creatinine is currently near baseline.  Continue to monitor. 6. Chronic thrombocytopenia.  Platelets have dipped down since admission from 122-91.  Continue to monitor.   DVT prophylaxis: SCDs Code Status: Full code Family Communication: No family present Disposition Plan: Discharge home once improved   Consultants:   Gastroenterology  Procedures:     Antimicrobials:      Subjective: Denies any shortness of breath or chest pain.  Continues to have some  blood in his stool.  Objective: Vitals:   12/18/17 1630 12/18/17 1635 12/18/17 1700 12/18/17 1800  BP: (!) 131/55  (!) 159/71 (!) 148/68  Pulse: 70 70 66 68  Resp: 15 10 16  (!) 21  Temp:   98.2 F (36.8 C)   TempSrc:   Oral   SpO2: 100% 100% 99% 96%  Weight:      Height:        Intake/Output Summary (Last 24 hours) at 12/18/2017 1836 Last data filed at 12/18/2017 1445 Gross per 24 hour  Intake 1388.65 ml  Output -  Net 1388.65 ml   Filed Weights   12/17/17 2100 12/18/17 0500 12/18/17 1500  Weight: 89.1 kg 93.4 kg 93.4 kg    Examination:  General exam: Appears calm and comfortable  Respiratory system: Clear to auscultation. Respiratory effort normal. Cardiovascular system: S1 & S2 heard, RRR. No JVD, murmurs, rubs, gallops or clicks. No pedal edema. Gastrointestinal system: Abdomen is nondistended, soft and nontender. No organomegaly or masses felt. Normal bowel sounds heard. Central nervous system: Alert and oriented. No focal neurological deficits. Extremities: Symmetric 5 x 5 power. Skin: No rashes, lesions or ulcers Psychiatry: Judgement and insight appear normal. Mood & affect appropriate.     Data Reviewed: I have personally reviewed following labs and imaging studies  CBC: Recent Labs  Lab 12/17/17 1422 12/17/17 1731 12/17/17 2321 12/18/17 0704 12/18/17 1706  WBC 10.1 8.7 7.8 7.7 6.3  NEUTROABS 8.8*  --   --   --   --  HGB 9.3* 8.5* 8.2* 7.6* 8.4*  HCT 28.9* 27.2* 26.0* 24.4* 26.4*  MCV 89.5 89.2 88.7 88.4 87.7  PLT 122* 111* 106* 98* 91*   Basic Metabolic Panel: Recent Labs  Lab 12/17/17 1422  NA 137  K 5.0  CL 109  CO2 23  GLUCOSE 324*  BUN 58*  CREATININE 1.25*  CALCIUM 7.9*   GFR: Estimated Creatinine Clearance: 57.7 mL/min (A) (by C-G formula based on SCr of 1.25 mg/dL (H)). Liver Function Tests: Recent Labs  Lab 12/17/17 1422  AST 11*  ALT 14  ALKPHOS 50  BILITOT 0.8  PROT 5.5*  ALBUMIN 3.1*   No results for input(s):  LIPASE, AMYLASE in the last 168 hours. No results for input(s): AMMONIA in the last 168 hours. Coagulation Profile: Recent Labs  Lab 12/17/17 1422  INR 1.20   Cardiac Enzymes: No results for input(s): CKTOTAL, CKMB, CKMBINDEX, TROPONINI in the last 168 hours. BNP (last 3 results) No results for input(s): PROBNP in the last 8760 hours. HbA1C: No results for input(s): HGBA1C in the last 72 hours. CBG: Recent Labs  Lab 12/17/17 1759 12/18/17 1548  GLUCAP 244* 81   Lipid Profile: No results for input(s): CHOL, HDL, LDLCALC, TRIG, CHOLHDL, LDLDIRECT in the last 72 hours. Thyroid Function Tests: No results for input(s): TSH, T4TOTAL, FREET4, T3FREE, THYROIDAB in the last 72 hours. Anemia Panel: No results for input(s): VITAMINB12, FOLATE, FERRITIN, TIBC, IRON, RETICCTPCT in the last 72 hours. Sepsis Labs: No results for input(s): PROCALCITON, LATICACIDVEN in the last 168 hours.  Recent Results (from the past 240 hour(s))  MRSA PCR Screening     Status: None   Collection Time: 12/17/17  9:02 PM  Result Value Ref Range Status   MRSA by PCR NEGATIVE NEGATIVE Final    Comment:        The GeneXpert MRSA Assay (FDA approved for NASAL specimens only), is one component of a comprehensive MRSA colonization surveillance program. It is not intended to diagnose MRSA infection nor to guide or monitor treatment for MRSA infections. Performed at [facility], [address] [place], [place] [address]          Radiology Studies: No results found.      Scheduled Meds: . diclofenac sodium  4 g Topical QID  . insulin aspart  0-9 Units Subcutaneous Q4H  . lidocaine      . meperidine      . midazolam      . [START ON 12/19/2017] pantoprazole  40 mg Oral BID AC   Continuous Infusions: . dextrose 5 % and 0.45 % NaCl with KCl 20 mEq/L 75 mL/hr at 12/18/17 1703  . pantoprozole (PROTONIX) infusion 8 mg/hr (12/18/17 1734)     LOS: 0 days    Time spent:     Erick Blinks, MD [facility] Pager [phone]  If 7PM-7AM, please contact night-coverage [email or web address] Password [facility] 12/18/2017, 6:36 PM

## 2017-12-18 NOTE — Consult Note (Signed)
Referring Provider: Dr. Linwood Dibbles, [facility] ED Primary Care Physician:  Samuel Jester, DO Primary Gastroenterologist:  Dr. Darrick Penna   Date of Admission: 12/17/17 Date of Consultation: 12/18/17  Reason for Consultation:  GI bleed   HPI:  Steven Osborne is a 77 y.o. year old male presenting with coffee-ground emesis and melena, onset yesterday morning around 6am. Reported waking up and feeling nauseated, then vomited large amount of black emesis. He then had multiple bouts of dark, tarry stool, and presented to the ED thereafter. Baseline Hgb 14.8 in Aug 2019. Presenting with Hgb 9.3 on admission yesterday, down to 7.6 this morning. Last episode of melena this morning, small amount. No further hematemesis. Hemodynamically stable. He has been on Plavix and aspirin 81 mg as an outpatient. Both on hold currently.   Denies abdominal pain. No further N/V. No prior EGD. No prior colonoscopy. Reports drinking alcohol as a teen to his late 95s, but no alcohol since that time. States he was told he had an ulcer in his late 68s after an "xray". He has occasional reflux and will take omeprazole; however, he does not take a PPI daily. No OTC NSAIDs. Denies dysphagia. No chest pain, shortness of breath. Denies fatigue. Has a significant other who he sees a few days a week.   Past Medical History:  Diagnosis Date  . ARTERIOSCLEROSIS 02/10/2010  . CAD 02/25/2010  . DIABETES TYPE 2 02/10/2010  . HYPERLIPIDEMIA NOS 02/10/2010  . HYPERTENSION 02/10/2010  . INTERMITTENT CLAUDICATION 02/10/2010  . MI, old     Past Surgical History:  Procedure Laterality Date  . CORONARY ARTERY BYPASS GRAFT  2005   4 vessel bypass     Prior to Admission medications   Medication Sig Start Date End Date Taking? Authorizing Provider  aspirin EC 81 MG tablet Take 81 mg by mouth daily.   Yes [provider]  insulin NPH Human (NOVOLIN N) 100 UNIT/ML injection Inject 0.2 mLs (20 Units total) into the skin 2 (two) times  daily before a meal. 09/26/17  Yes Tat, David, MD  Omega-3 Fatty Acids (FISH OIL PO) Take 1 capsule by mouth daily.   Yes [provider]  vitamin C (ASCORBIC ACID) 500 MG tablet Take 500 mg by mouth daily.   Yes [provider]  VITAMIN E PO Take 1 tablet by mouth daily.   Yes [provider]  atorvastatin (LIPITOR) 40 MG tablet Take 1 tablet (40 mg total) by mouth daily at 6 PM. 09/26/17   Tat, Onalee Hua, MD  carvedilol (COREG) 6.25 MG tablet Take 1 tablet (6.25 mg total) by mouth 2 (two) times daily with a meal. 09/26/17   Tat, Onalee Hua, MD  clopidogrel (PLAVIX) 75 MG tablet Take 75 mg by mouth daily.  12/11/15   [provider]  hydrALAZINE (APRESOLINE) 25 MG tablet Take 1 tablet (25 mg total) by mouth every 8 (eight) hours. 09/26/17   Catarina Hartshorn, MD  isosorbide mononitrate (IMDUR) 30 MG 24 hr tablet Take 30 mg by mouth daily.  12/11/15   [provider]  LORazepam (ATIVAN) 0.5 MG tablet Take 0.5 mg by mouth 2 (two) times daily.    [provider]  timolol (TIMOPTIC) 0.25 % ophthalmic solution Place 1 drop into both eyes.  08/01/11   [provider]    Current Facility-Administered Medications  Medication Dose Route Frequency Provider Last Rate Last Dose  . 0.9 %  sodium chloride infusion   Intravenous Continuous Emokpae, Ejiroghene E, MD  100 mL/hr at 12/18/17 0500    . Influenza vac split quadrivalent PF (FLUARIX) injection 0.5 mL  0.5 mL Intramuscular Tomorrow-1000 Emokpae, Ejiroghene E, MD      . insulin aspart (novoLOG) injection 0-9 Units  0-9 Units Subcutaneous Q4H Emokpae, Ejiroghene E, MD      . ondansetron (ZOFRAN) tablet 4 mg  4 mg Oral Q6H PRN Emokpae, Ejiroghene E, MD       Or  . ondansetron (ZOFRAN) injection 4 mg  4 mg Intravenous Q6H PRN Emokpae, Ejiroghene E, MD      . pantoprazole (PROTONIX) 80 mg in sodium chloride 0.9 % 250 mL (0.32 mg/mL) infusion  8 mg/hr Intravenous Continuous Emokpae, Ejiroghene E, MD 25 mL/hr at 12/18/17  0500 8 mg/hr at 12/18/17 0500    Allergies as of 12/17/2017 - Review Complete 12/17/2017  Allergen Reaction Noted  . Crestor [rosuvastatin calcium]  07/23/2010    Family History  Problem Relation Age of Onset  . Diabetes Mother   . Heart disease Mother   . Diabetes Father   . Heart disease Father   . Stroke Father   . Diabetes Sister   . Diabetes Brother   . Hypertension Other   . Colon cancer Neg Hx   . Colon polyps Neg Hx     Social History   Socioeconomic History  . Marital status: Divorced    Spouse name: Not on file  . Number of children: Not on file  . Years of education: Not on file  . Highest education level: Not on file  Occupational History  . Not on file  Social Needs  . Financial resource strain: Not on file  . Food insecurity:    Worry: Not on file    Inability: Not on file  . Transportation needs:    Medical: Not on file    Non-medical: Not on file  Tobacco Use  . Smoking status: Former Smoker    Types: Cigars  . Smokeless tobacco: Former Neurosurgeon    Types: Chew    Quit date: 02/21/2014  Substance and Sexual Activity  . Alcohol use: No    Comment: as a teenager to late 20s, none since   . Drug use: No  . Sexual activity: Not on file  Lifestyle  . Physical activity:    Days per week: Not on file    Minutes per session: Not on file  . Stress: Not on file  Relationships  . Social connections:    Talks on phone: Not on file    Gets together: Not on file    Attends religious service: Not on file    Active member of club or organization: Not on file    Attends meetings of clubs or organizations: Not on file    Relationship status: Not on file  . Intimate partner violence:    Fear of current or ex partner: Not on file    Emotionally abused: Not on file    Physically abused: Not on file    Forced sexual activity: Not on file  Other Topics Concern  . Not on file  Social History Narrative   Divorced, lives alone   Regular exercise-yes     Review of Systems: As mentioned in HPI   Physical Exam: Vital signs in last 24 hours: Temp:  [97.5 F (36.4 C)-98.2 F (36.8 C)] 97.5 F (36.4 C) (10/28 0756) Pulse Rate:  [58-90] 81 (10/28 0800) Resp:  [0-31] 18 (10/28 0800) BP: (100-188)/(53-138) 158/90 (10/28  0800) SpO2:  [90 %-99 %] 99 % (10/28 0800) Weight:  [89.1 kg-93.4 kg] 93.4 kg (10/28 0500) Last BM Date: 12/17/17 General:   Alert,  Well-developed, well-nourished, pleasant and cooperative in NAD Head:  Normocephalic and atraumatic. Eyes:  Sclera clear, no icterus.   Conjunctiva pink. Ears:  Normal auditory acuity. Nose:  No deformity, discharge,  or lesions. Mouth:  No deformity or lesions Lungs:  Clear throughout to auscultation.   Heart:  S1 S2 present without murmurs  Abdomen:  Soft, nontender and nondistended. No masses, hepatosplenomegaly or hernias noted. Normal bowel sounds, without guarding, and without rebound.   Rectal:  Deferred until time of colonoscopy.   Msk:  Symmetrical without gross deformities. Normal posture. Extremities:  Without  edema. Neurologic:  Alert and  oriented x4 Skin:  Intact without significant lesions or rashes. Psych:  Alert and cooperative. Normal mood and affect.  Intake/Output from previous day: 10/27 0701 - 10/28 0700 In: 884.8 [I.V.:884.8] Out: -  Intake/Output this shift: No intake/output data recorded.  Lab Results: Recent Labs    12/17/17 1731 12/17/17 2321 12/18/17 0704  WBC 8.7 7.8 7.7  HGB 8.5* 8.2* 7.6*  HCT 27.2* 26.0* 24.4*  PLT 111* 106* 98*   BMET Recent Labs    12/17/17 1422  NA 137  K 5.0  CL 109  CO2 23  GLUCOSE 324*  BUN 58*  CREATININE 1.25*  CALCIUM 7.9*   LFT Recent Labs    12/17/17 1422  PROT 5.5*  ALBUMIN 3.1*  AST 11*  ALT 14  ALKPHOS 50  BILITOT 0.8   PT/INR Recent Labs    12/17/17 1422  LABPROT 15.1  INR 1.20    Impression: 77 year old pleasant male presenting with hematemesis and melena in setting of Plavix  and 81 mg aspirin, no PPI, denying any other NSAIDs. No further hematemesis and last episode of melena small amount this morning. Baseline Hgb in the 14 range several months ago, presenting with acute blood loss anemia and Hgb 9 range on admission, now down to 7.6. Clinically asymptomatic but anticipate will continue to drift due to continued bleeding. Discussed with Dr. Kerry Hough and agree with 1 unit PRBCs.   Will need diagnostic EGD with Dr. Darrick Penna. I discussed risks and benefits with patient who stated understanding.  As of note, he has chronic thrombocytopenia but no evidence of liver disease on recent ultrasound in Aug 2019. Platelets 98 this morning. INR 1.2.   Plan: Remain NPO Continue Protonix infusion Agree with 1 unit PRBCs Agree with holding plavix and aspirin EGD with Dr. Darrick Penna likely today, with PRBCs started prior to EGD   Gelene Mink, PhD, ANP-BC [facility]     LOS: 0 days    12/18/2017, 8:41 AM

## 2017-12-18 NOTE — Interval H&P Note (Signed)
History and Physical Interval Note:  12/18/2017 4:11 PM  Steven Osborne  has presented today for surgery, with the diagnosis of acute blood loss anemia, melena, hematemesis  The various methods of treatment have been discussed with the patient and family. After consideration of risks, benefits and other options for treatment, the patient has consented to  Procedure(s): ESOPHAGOGASTRODUODENOSCOPY (EGD) (N/A) as a surgical intervention .  The patient's history has been reviewed, patient examined, no change in status, stable for surgery.  I have reviewed the patient's chart and labs.  Questions were answered to the patient's satisfaction.     [employer]

## 2017-12-18 NOTE — Op Note (Signed)
[facility] Patient Name: Steven Osborne Procedure Date: 12/18/2017 4:04 PM MRN: [id number] Date of Birth: 1940/05/09 Attending MD: Jonette Eva MD, MD CSN: [id number] Age: 77 Admit Type: Outpatient Procedure:                Upper GI endoscopy WITH COLD FORCEPS BIOPSY Indications:              Coffee-ground emesis, Melena Providers:                Jonette Eva MD, MD, Jannett Celestine, RN, Dyann Ruddle Referring MD:             Samuel Jester MD, MD Medicines:                Meperidine 50 mg IV, Midazolam 3 mg IV Complications:            No immediate complications. Estimated Blood Loss:     Estimated blood loss was minimal. Procedure:                Pre-Anesthesia Assessment:                           - Prior to the procedure, a History and Physical                            was performed, and patient medications and                            allergies were reviewed. The patient's tolerance of                            previous anesthesia was also reviewed. The risks                            and benefits of the procedure and the sedation                            options and risks were discussed with the patient.                            All questions were answered, and informed consent                            was obtained. Prior Anticoagulants: The patient                            last took aspirin 1 day and Plavix (clopidogrel) 1                            day prior to the procedure. ASA Grade Assessment:                            III - A patient with severe systemic disease. After                            reviewing the risks and benefits, the patient was  deemed in satisfactory condition to undergo the                            procedure. After obtaining informed consent, the                            endoscope was passed under direct vision.                            Throughout the procedure, the patient's blood    pressure, pulse, and oxygen saturations were                            monitored continuously. The [id number]-[id number] ([id number])                            scope was introduced through the mouth, and                            advanced to the second part of duodenum. The upper                            GI endoscopy was accomplished without difficulty.                            The patient tolerated the procedure well. Scope In: 4:24:53 PM Scope Out: 4:32:47 PM Total Procedure Duration: 0 hours 7 minutes 54 seconds  Findings:      The examined esophagus was normal.      Three non-bleeding cratered gastric ulcers with no stigmata of bleeding       were found in the prepyloric region of the stomach and at the pylorus.      Patchy moderate inflammation characterized by congestion (edema),       erosions and erythema was found in the entire examined stomach. Biopsies       were taken with a cold forceps for Helicobacter pylori testing.      The examined duodenum was normal. Impression:               - Normal esophagus.                           - MELENA GI BLEED DUE TO CLEANBASED gastric                            ulcers,LOW RISK FOR RE-BLEED                           - MODERATE Gastritis. Biopsied. Moderate Sedation:      Moderate (conscious) sedation was administered by the endoscopy nurse       and supervised by the endoscopist. The following parameters were       monitored: oxygen saturation, heart rate, blood pressure, and response       to care. Total physician intraservice time was 17 minutes. Recommendation:           - Return patient to hospital ward for ongoing care.                           -  Soft diet and diabetic (ADA) diet.                           - Continue present medications. PROTONIX BID FOR                            ONE MO THEN ONCE DAILY FORVER. OK TO RE-START                            ANTIPLATELETTHERAPY. CONSIDER BENEFITS V. RISKS OF                            DUAL  ANTIPLATELET THERAPY.                           - Await pathology results.                           - Repeat upper endoscopy in 3 months for                            surveillance.                           - Return to GI office in 6 months. Procedure Code(s):        --- Professional ---                           [phone], Esophagogastroduodenoscopy, flexible,                            transoral; with biopsy, single or multiple                           G0500, Moderate sedation services provided by the                            same physician or other qualified health care                            professional performing a gastrointestinal                            endoscopic service that sedation supports,                            requiring the presence of an independent trained                            observer to assist in the monitoring of the                            patient's level of consciousness and physiological                            status; initial  15 minutes of intra-service time;                            patient age 52 years or older (additional time may                            be reported with [address], as appropriate) Diagnosis Code(s):        --- Professional ---                           K25.9, Gastric ulcer, unspecified as acute or                            chronic, without hemorrhage or perforation                           K29.70, Gastritis, unspecified, without bleeding                           K92.0, Hematemesis                           K92.1, Melena (includes Hematochezia) CPT copyright 2018 American Medical Association. All rights reserved. The codes documented in this report are preliminary and upon coder review may  be revised to meet current compliance requirements. Jonette Eva, MD Jonette Eva MD, MD 12/18/2017 4:44:19 PM This report has been signed electronically. Number of Addenda: 0

## 2017-12-19 ENCOUNTER — Telehealth: Payer: Self-pay | Admitting: Gastroenterology

## 2017-12-19 ENCOUNTER — Encounter (HOSPITAL_COMMUNITY): Payer: Self-pay | Admitting: Gastroenterology

## 2017-12-19 ENCOUNTER — Encounter: Payer: Self-pay | Admitting: Gastroenterology

## 2017-12-19 DIAGNOSIS — I255 Ischemic cardiomyopathy: Secondary | ICD-10-CM | POA: Diagnosis not present

## 2017-12-19 DIAGNOSIS — E1122 Type 2 diabetes mellitus with diabetic chronic kidney disease: Secondary | ICD-10-CM | POA: Diagnosis not present

## 2017-12-19 DIAGNOSIS — E113592 Type 2 diabetes mellitus with proliferative diabetic retinopathy without macular edema, left eye: Secondary | ICD-10-CM | POA: Diagnosis not present

## 2017-12-19 DIAGNOSIS — I10 Essential (primary) hypertension: Secondary | ICD-10-CM | POA: Diagnosis not present

## 2017-12-19 DIAGNOSIS — K922 Gastrointestinal hemorrhage, unspecified: Secondary | ICD-10-CM | POA: Diagnosis not present

## 2017-12-19 DIAGNOSIS — Z794 Long term (current) use of insulin: Secondary | ICD-10-CM | POA: Diagnosis not present

## 2017-12-19 DIAGNOSIS — E1151 Type 2 diabetes mellitus with diabetic peripheral angiopathy without gangrene: Secondary | ICD-10-CM | POA: Diagnosis not present

## 2017-12-19 DIAGNOSIS — D696 Thrombocytopenia, unspecified: Secondary | ICD-10-CM | POA: Diagnosis not present

## 2017-12-19 DIAGNOSIS — N183 Chronic kidney disease, stage 3 (moderate): Secondary | ICD-10-CM | POA: Diagnosis not present

## 2017-12-19 LAB — GLUCOSE, CAPILLARY
GLUCOSE-CAPILLARY: 104 mg/dL — AB (ref 70–99)
GLUCOSE-CAPILLARY: 120 mg/dL — AB (ref 70–99)
GLUCOSE-CAPILLARY: 146 mg/dL — AB (ref 70–99)
GLUCOSE-CAPILLARY: 155 mg/dL — AB (ref 70–99)
GLUCOSE-CAPILLARY: 88 mg/dL (ref 70–99)
Glucose-Capillary: 101 mg/dL — ABNORMAL HIGH (ref 70–99)
Glucose-Capillary: 111 mg/dL — ABNORMAL HIGH (ref 70–99)
Glucose-Capillary: 159 mg/dL — ABNORMAL HIGH (ref 70–99)
Glucose-Capillary: 163 mg/dL — ABNORMAL HIGH (ref 70–99)
Glucose-Capillary: 55 mg/dL — ABNORMAL LOW (ref 70–99)
Glucose-Capillary: 61 mg/dL — ABNORMAL LOW (ref 70–99)
Glucose-Capillary: 166 mg/dL — ABNORMAL HIGH (ref 70–99)
Glucose-Capillary: 221 mg/dL — ABNORMAL HIGH (ref 70–99)

## 2017-12-19 LAB — TYPE AND SCREEN
ABO/RH(D): O POS
Antibody Screen: NEGATIVE
UNIT DIVISION: 0

## 2017-12-19 LAB — BASIC METABOLIC PANEL
Anion gap: 4 — ABNORMAL LOW (ref 5–15)
BUN: 28 mg/dL — ABNORMAL HIGH (ref 8–23)
CALCIUM: 7.8 mg/dL — AB (ref 8.9–10.3)
CO2: 25 mmol/L (ref 22–32)
Chloride: 112 mmol/L — ABNORMAL HIGH (ref 98–111)
Creatinine, Ser: 1.16 mg/dL (ref 0.61–1.24)
GFR calc non Af Amer: 59 mL/min — ABNORMAL LOW (ref >60)
Glucose, Bld: 168 mg/dL — ABNORMAL HIGH (ref 70–99)
Potassium: 3.9 mmol/L (ref 3.5–5.1)
SODIUM: 141 mmol/L (ref 135–145)

## 2017-12-19 LAB — BPAM RBC
Blood Product Expiration Date: 201911292359
ISSUE DATE / TIME: 201910281226
Unit Type and Rh: 5100

## 2017-12-19 LAB — CBC
HEMATOCRIT: 25.9 % — AB (ref 39.0–52.0)
Hemoglobin: 8.1 g/dL — ABNORMAL LOW (ref 13.0–17.0)
MCH: 27.2 pg (ref 26.0–34.0)
MCHC: 31.3 g/dL (ref 30.0–36.0)
MCV: 86.9 fL (ref 80.0–100.0)
Platelets: 92 10*3/uL — ABNORMAL LOW (ref 150–400)
RBC: 2.98 MIL/uL — ABNORMAL LOW (ref 4.22–5.81)
RDW: 14.6 % (ref 11.5–15.5)
WBC: 5.9 10*3/uL (ref 4.0–10.5)
nRBC: 0 % (ref 0.0–0.2)

## 2017-12-19 MED ORDER — PANTOPRAZOLE SODIUM 40 MG PO TBEC: 40.0000 mg | DELAYED_RELEASE_TABLET | Freq: Two times a day (BID) | ORAL | 1 refills | Status: AC

## 2017-12-19 NOTE — Telephone Encounter (Signed)
Please arrange hospital outpatient follow-up in 2 months due to history of PUD. Will need surveillance EGD arranged at that appt.

## 2017-12-19 NOTE — Progress Notes (Signed)
    Subjective: No overt GI bleeding. No abdominal pain, N/V. Tolerating diet. Wants to go home.   Objective: Vital signs in last 24 hours: Temp:  [97.5 F (36.4 C)-98.2 F (36.8 C)] 98.2 F (36.8 C) (10/29 0400) Pulse Rate:  [62-85] 85 (10/29 0800) Resp:  [8-23] 23 (10/29 0800) BP: (98-181)/(50-95) 98/78 (10/29 0800) SpO2:  [93 %-100 %] 98 % (10/29 0800) Weight:  [89.1 kg-93.4 kg] 89.1 kg (10/29 0500) Last BM Date: 12/18/17 General:   Alert and oriented, pleasant Head:  Normocephalic and atraumatic. Abdomen:  Bowel sounds present, soft, non-tender, non-distended. Neurologic:  Alert and  oriented x4 Psych:  Alert and cooperative. Normal mood and affect.  Intake/Output from previous day: 10/28 0701 - 10/29 0700 In: 1609.8 [I.V.:1377.8; Blood:232] Out: -  Intake/Output this shift: Total I/O In: 55.5 [I.V.:55.5] Out: -   Lab Results: Recent Labs    12/18/17 0704 12/18/17 1706 12/19/17 0411  WBC 7.7 6.3 5.9  HGB 7.6* 8.4* 8.1*  HCT 24.4* 26.4* 25.9*  PLT 98* 91* 92*   BMET Recent Labs    12/17/17 1422 12/19/17 0411  NA 137 141  K 5.0 3.9  CL 109 112*  CO2 23 25  GLUCOSE 324* 168*  BUN 58* 28*  CREATININE 1.25* 1.16  CALCIUM 7.9* 7.8*   LFT Recent Labs    12/17/17 1422  PROT 5.5*  ALBUMIN 3.1*  AST 11*  ALT 14  ALKPHOS 50  BILITOT 0.8   PT/INR Recent Labs    12/17/17 1422  LABPROT 15.1  INR 1.20    Assessment: 77 year old male admitted with upper GI bleed in setting of Plavix and 81 mg aspirin secondary to clean-based gastric ulcers. EGD 12/18/17. No bleeding control therapy needed. No further overt GI bleeding. Tolerating diet. Clinically stable. Will sign off.      Plan: Await pathology results Repeat EGD in 3 months BID PPI for 3 months then daily indefinitely Consider risks and benefits of dual antiplatelet therapy but may resume Signing off: will arrange outpatient follow-up   Gelene Mink, PhD, ANP-BC [facility]  Gastroenterology     LOS: 0 days    12/19/2017, 8:56 AM

## 2017-12-19 NOTE — Telephone Encounter (Signed)
PATIENT SCHEDULED AND LETTER SENT  °

## 2017-12-19 NOTE — Discharge Summary (Signed)
Physician Discharge Summary  Steven Osborne XBJ:[id number] DOB: Jan 27, 1941 DOA: 12/17/2017  PCP: Samuel Jester, DO  Admit date: 12/17/2017 Discharge date: 12/19/2017  Admitted From: Home Disposition: Home  Recommendations for Outpatient Follow-up:  1. Follow up with PCP in 1-2 weeks 2. Please obtain BMP/CBC in one week 3. Follow-up with primary cardiologist at [facility] next month as previously scheduled 4. Follow-up scheduled with GI in the next 3 months   Discharge Condition: Stable CODE STATUS: Full code Diet recommendation: Heart healthy, carb modified  Brief/Interim Summary: 77 year old male with a history of coronary artery disease status post CABG in 2004, diabetes, hypertension who is on chronic dual antiplatelet therapy, presented with dark-colored vomitus and dark stools.  He was found to have upper GI bleeding and was admitted for further treatment.  The patient did receive 1 unit of PRBC for low hemoglobin.  He was started on Protonix infusion.  Antiplatelets were held on admission.  He underwent endoscopy and was noted to have a gastric ulcer which was not actively bleeding.  Recommendations per GI were to continue twice daily Protonix for the next month followed by daily Protonix thereafter.  It was felt that he could restart on his antiplatelet therapy.  After discussing with cardiology, Dr. Wyline Mood, recommendations were that since the patient has not had any recent stenting, to continue aspirin but discontinue Plavix until he can follow-up with his primary cardiologist to discuss indications for dual antiplatelet therapy.  At this time he should discontinue aspirin.  Patient has not had any further bowel movements.  He is no longer having vomiting and tolerating a solid diet.  Hemoglobin has been stable overnight.  He is anxious to discharge home.  He is scheduled follow-up with GI in the next few months.  He says he already has a follow-up appointment with his primary cardiologist in  the next month.  Discharge Diagnoses:  Active Problems:   Essential hypertension   Diabetes (HCC)   Ischemic cardiomyopathy   Type 2 diabetes mellitus with proliferative retinopathy (HCC)   Thrombocytopenia (HCC)   CKD stage 3 due to type 2 diabetes mellitus (HCC)   GI bleed   Acute upper GI bleed    Discharge Instructions  Discharge Instructions    Diet - low sodium heart healthy   Complete by:  As directed    Increase activity slowly   Complete by:  As directed      Allergies as of 12/19/2017      Reactions   Crestor [rosuvastatin Calcium]    myalgia      Medication List    STOP taking these medications   clopidogrel 75 MG tablet Commonly known as:  PLAVIX   lisinopril 20 MG tablet Commonly known as:  PRINIVIL,ZESTRIL   meloxicam 15 MG tablet Commonly known as:  MOBIC     TAKE these medications   aspirin EC 81 MG tablet Take 81 mg by mouth daily.   atorvastatin 40 MG tablet Commonly known as:  LIPITOR Take 1 tablet (40 mg total) by mouth daily at 6 PM.   carvedilol 6.25 MG tablet Commonly known as:  COREG Take 1 tablet (6.25 mg total) by mouth 2 (two) times daily with a meal.   FISH OIL PO Take 1 capsule by mouth daily.   hydrALAZINE 25 MG tablet Commonly known as:  APRESOLINE Take 1 tablet (25 mg total) by mouth every 8 (eight) hours.   HYDROcodone-acetaminophen 5-325 MG tablet Commonly known as:  NORCO/VICODIN Take 1 tablet by  mouth every 8 (eight) hours as needed for moderate pain.   insulin NPH Human 100 UNIT/ML injection Commonly known as:  HUMULIN N,NOVOLIN N Inject 0.2 mLs (20 Units total) into the skin 2 (two) times daily before a meal.   isosorbide mononitrate 30 MG 24 hr tablet Commonly known as:  IMDUR Take 30 mg by mouth daily.   LORazepam 0.5 MG tablet Commonly known as:  ATIVAN Take 0.5 mg by mouth 2 (two) times daily.   pantoprazole 40 MG tablet Commonly known as:  PROTONIX Take 1 tablet (40 mg total) by mouth 2 (two)  times daily before a meal.   timolol 0.25 % ophthalmic solution Commonly known as:  TIMOPTIC Place 1 drop into the right eye 2 (two) times daily.   vitamin C 500 MG tablet Commonly known as:  ASCORBIC ACID Take 500 mg by mouth daily.   VITAMIN E PO Take 1 tablet by mouth daily.      Follow-up Information    Baldo Daub, MD Follow up.   Specialty:  Cardiology Why:  follow up as scheduled and discuss resuming plavix if indicated Contact information: [address] [place] [address] [phone]          Allergies  Allergen Reactions  . Crestor [Rosuvastatin Calcium]     myalgia    Consultations:  gastroenterology   Procedures/Studies:  No results found.    Subjective: Feeling better. No vomiting. No bowel movements. Wants to go home  Discharge Exam: Vitals:   12/19/17 0600 12/19/17 0700 12/19/17 0800 12/19/17 1145  BP: (!) 137/50 (!) 149/78 98/78   Pulse: 62 63 85   Resp: 10 10 (!) 23   Temp:    97.7 F (36.5 C)  TempSrc:    Oral  SpO2: 97% 94% 98%   Weight:      Height:        General: Pt is alert, awake, not in acute distress Cardiovascular: RRR, S1/S2 +, no rubs, no gallops Respiratory: CTA bilaterally, no wheezing, no rhonchi Abdominal: Soft, NT, ND, bowel sounds + Extremities: no edema, no cyanosis    The results of significant diagnostics from this hospitalization (including imaging, microbiology, ancillary and laboratory) are listed below for reference.     Microbiology: Recent Results (from the past 240 hour(s))  MRSA PCR Screening     Status: None   Collection Time: 12/17/17  9:02 PM  Result Value Ref Range Status   MRSA by PCR NEGATIVE NEGATIVE Final    Comment:        The GeneXpert MRSA Assay (FDA approved for NASAL specimens only), is one component of a comprehensive MRSA colonization surveillance program. It is not intended to diagnose MRSA infection nor to guide or monitor treatment for MRSA  infections. Performed at [facility], [address], [place], [place] [address]      Labs: BNP (last 3 results) No results for input(s): BNP in the last 8760 hours. Basic Metabolic Panel: Recent Labs  Lab 12/17/17 1422 12/19/17 0411  NA 137 141  K 5.0 3.9  CL 109 112*  CO2 23 25  GLUCOSE 324* 168*  BUN 58* 28*  CREATININE 1.25* 1.16  CALCIUM 7.9* 7.8*   Liver Function Tests: Recent Labs  Lab 12/17/17 1422  AST 11*  ALT 14  ALKPHOS 50  BILITOT 0.8  PROT 5.5*  ALBUMIN 3.1*   No results for input(s): LIPASE, AMYLASE in the last 168 hours. No results for input(s): AMMONIA in the  last 168 hours. CBC: Recent Labs  Lab 12/17/17 1422 12/17/17 1731 12/17/17 2321 12/18/17 0704 12/18/17 1706 12/19/17 0411  WBC 10.1 8.7 7.8 7.7 6.3 5.9  NEUTROABS 8.8*  --   --   --   --   --   HGB 9.3* 8.5* 8.2* 7.6* 8.4* 8.1*  HCT 28.9* 27.2* 26.0* 24.4* 26.4* 25.9*  MCV 89.5 89.2 88.7 88.4 87.7 86.9  PLT 122* 111* 106* 98* 91* 92*   Cardiac Enzymes: No results for input(s): CKTOTAL, CKMB, CKMBINDEX, TROPONINI in the last 168 hours. BNP: Invalid input(s): POCBNP CBG: Recent Labs  Lab 12/18/17 1932 12/19/17 0020 12/19/17 0410 12/19/17 0835 12/19/17 1139  GLUCAP 163* 146* 155* 166* 221*   D-Dimer No results for input(s): DDIMER in the last 72 hours. Hgb A1c No results for input(s): HGBA1C in the last 72 hours. Lipid Profile No results for input(s): CHOL, HDL, LDLCALC, TRIG, CHOLHDL, LDLDIRECT in the last 72 hours. Thyroid function studies No results for input(s): TSH, T4TOTAL, T3FREE, THYROIDAB in the last 72 hours.  Invalid input(s): FREET3 Anemia work up No results for input(s): VITAMINB12, FOLATE, FERRITIN, TIBC, IRON, RETICCTPCT in the last 72 hours. Urinalysis    Component Value Date/Time   COLORURINE YELLOW 09/24/2017 1355   APPEARANCEUR CLEAR 09/24/2017 1355   LABSPEC 1.024 09/24/2017 1355   PHURINE 5.0 09/24/2017 1355   GLUCOSEU >=500 (A) 09/24/2017 1355    HGBUR NEGATIVE 09/24/2017 1355   BILIRUBINUR NEGATIVE 09/24/2017 1355   KETONESUR NEGATIVE 09/24/2017 1355   PROTEINUR 100 (A) 09/24/2017 1355   NITRITE NEGATIVE 09/24/2017 1355   LEUKOCYTESUR NEGATIVE 09/24/2017 1355   Sepsis Labs Invalid input(s): PROCALCITONIN,  WBC,  LACTICIDVEN Microbiology Recent Results (from the past 240 hour(s))  MRSA PCR Screening     Status: None   Collection Time: 12/17/17  9:02 PM  Result Value Ref Range Status   MRSA by PCR NEGATIVE NEGATIVE Final    Comment:        The GeneXpert MRSA Assay (FDA approved for NASAL specimens only), is one component of a comprehensive MRSA colonization surveillance program. It is not intended to diagnose MRSA infection nor to guide or monitor treatment for MRSA infections. Performed at [facility], [address] [place], [place] [address]      Time coordinating discharge:  SIGNED:   Erick Blinks, MD  [facility] 12/19/2017, 11:56 AM Pager   If 7PM-7AM, please contact night-coverage [email or web address] Password [email or web address]

## 2017-12-20 DIAGNOSIS — E139 Other specified diabetes mellitus without complications: Secondary | ICD-10-CM | POA: Diagnosis not present

## 2017-12-20 DIAGNOSIS — I10 Essential (primary) hypertension: Secondary | ICD-10-CM | POA: Diagnosis not present

## 2017-12-20 DIAGNOSIS — Z8711 Personal history of peptic ulcer disease: Secondary | ICD-10-CM | POA: Diagnosis not present

## 2017-12-20 DIAGNOSIS — M5136 Other intervertebral disc degeneration, lumbar region: Secondary | ICD-10-CM | POA: Diagnosis not present

## 2017-12-20 DIAGNOSIS — K219 Gastro-esophageal reflux disease without esophagitis: Secondary | ICD-10-CM | POA: Diagnosis not present

## 2017-12-21 DIAGNOSIS — E139 Other specified diabetes mellitus without complications: Secondary | ICD-10-CM | POA: Diagnosis not present

## 2017-12-21 DIAGNOSIS — L84 Corns and callosities: Secondary | ICD-10-CM | POA: Diagnosis not present

## 2017-12-25 MED ORDER — AMOXICILLIN 500 MG PO TABS: ORAL_TABLET | ORAL | 0 refills | Status: DC

## 2017-12-25 MED ORDER — CLARITHROMYCIN 500 MG PO TABS: ORAL_TABLET | ORAL | 0 refills | Status: DC

## 2017-12-25 NOTE — Telephone Encounter (Signed)
PLEASE CALL PT. HIS stomach Bx showed H. Pylori infection. He needs AMOXICILLIN 500 mg 2 po BID for 10 days and Biaxin 500 mg po bid for 10 days, #qs, rfx0. TAKE PROTONIX BID for 30 days then 1 po dAILY forEVER. DO NOT TAKE LIPITOR WHILE TAKING THE ANTIBIOTICS. Med side effects include NVD, abd pain, and metallic taste.

## 2017-12-26 NOTE — Telephone Encounter (Signed)
Tried to call and Vm not set up.

## 2017-12-26 NOTE — Telephone Encounter (Signed)
Called again, VM not set up. Mailing a letter for pt to call.

## 2017-12-27 DIAGNOSIS — I10 Essential (primary) hypertension: Secondary | ICD-10-CM | POA: Diagnosis not present

## 2017-12-27 DIAGNOSIS — E7800 Pure hypercholesterolemia, unspecified: Secondary | ICD-10-CM | POA: Diagnosis not present

## 2017-12-27 DIAGNOSIS — G894 Chronic pain syndrome: Secondary | ICD-10-CM | POA: Diagnosis not present

## 2017-12-27 DIAGNOSIS — E139 Other specified diabetes mellitus without complications: Secondary | ICD-10-CM | POA: Diagnosis not present

## 2018-01-01 ENCOUNTER — Telehealth: Payer: Self-pay | Admitting: Gastroenterology

## 2018-01-01 NOTE — Telephone Encounter (Signed)
[phone] patient received letter to call, please call him back as soon as you can

## 2018-01-01 NOTE — Telephone Encounter (Signed)
LMOM to call.

## 2018-01-01 NOTE — Telephone Encounter (Signed)
Pt called and is aware of results and plan. He is no longer taking Lipitor but is taking Livalo. I told him NOT to take the Livalo with antibiotics until I get the OK from Dr. Darrick Penna. He said he will hold it til he hears back from [place].

## 2018-01-01 NOTE — Telephone Encounter (Signed)
Pt is aware of results and plan of his antibiotics.

## 2018-01-01 NOTE — Telephone Encounter (Signed)
PLEASE CALL PT. Do not LIVALO WITH ABX.

## 2018-01-02 NOTE — Telephone Encounter (Signed)
Letter mailed for pt to HOLD the Livalo while taking the antibiotics.

## 2018-01-02 NOTE — Telephone Encounter (Signed)
LMOM to call.

## 2018-01-02 NOTE — Telephone Encounter (Signed)
Pt called and is aware to continue HOLDING the Livalo until he completes his antibiotics. Pt was informed and his friend that helps with his meds, [employer] Corns, both were informed.

## 2018-01-17 DIAGNOSIS — E113312 Type 2 diabetes mellitus with moderate nonproliferative diabetic retinopathy with macular edema, left eye: Secondary | ICD-10-CM | POA: Diagnosis not present

## 2018-01-17 DIAGNOSIS — E11311 Type 2 diabetes mellitus with unspecified diabetic retinopathy with macular edema: Secondary | ICD-10-CM | POA: Diagnosis not present

## 2018-01-17 DIAGNOSIS — Z794 Long term (current) use of insulin: Secondary | ICD-10-CM | POA: Diagnosis not present

## 2018-01-24 DIAGNOSIS — K922 Gastrointestinal hemorrhage, unspecified: Secondary | ICD-10-CM | POA: Diagnosis not present

## 2018-01-24 DIAGNOSIS — E785 Hyperlipidemia, unspecified: Secondary | ICD-10-CM | POA: Diagnosis not present

## 2018-01-24 DIAGNOSIS — I2510 Atherosclerotic heart disease of native coronary artery without angina pectoris: Secondary | ICD-10-CM | POA: Diagnosis not present

## 2018-01-24 DIAGNOSIS — T39395A Adverse effect of other nonsteroidal anti-inflammatory drugs [NSAID], initial encounter: Secondary | ICD-10-CM | POA: Diagnosis not present

## 2018-01-24 DIAGNOSIS — I255 Ischemic cardiomyopathy: Secondary | ICD-10-CM | POA: Diagnosis not present

## 2018-01-24 DIAGNOSIS — I10 Essential (primary) hypertension: Secondary | ICD-10-CM | POA: Diagnosis not present

## 2018-01-30 DIAGNOSIS — F419 Anxiety disorder, unspecified: Secondary | ICD-10-CM | POA: Diagnosis not present

## 2018-01-30 DIAGNOSIS — M5136 Other intervertebral disc degeneration, lumbar region: Secondary | ICD-10-CM | POA: Diagnosis not present

## 2018-01-30 DIAGNOSIS — G894 Chronic pain syndrome: Secondary | ICD-10-CM | POA: Diagnosis not present

## 2018-01-30 DIAGNOSIS — H10029 Other mucopurulent conjunctivitis, unspecified eye: Secondary | ICD-10-CM | POA: Diagnosis not present

## 2018-01-30 DIAGNOSIS — E139 Other specified diabetes mellitus without complications: Secondary | ICD-10-CM | POA: Diagnosis not present

## 2018-01-30 DIAGNOSIS — I25810 Atherosclerosis of coronary artery bypass graft(s) without angina pectoris: Secondary | ICD-10-CM | POA: Diagnosis not present

## 2018-01-30 DIAGNOSIS — E7800 Pure hypercholesterolemia, unspecified: Secondary | ICD-10-CM | POA: Diagnosis not present

## 2018-01-30 DIAGNOSIS — I10 Essential (primary) hypertension: Secondary | ICD-10-CM | POA: Diagnosis not present

## 2018-03-01 DIAGNOSIS — H1830 Unspecified corneal membrane change: Secondary | ICD-10-CM | POA: Diagnosis not present

## 2018-03-01 DIAGNOSIS — Z794 Long term (current) use of insulin: Secondary | ICD-10-CM | POA: Diagnosis not present

## 2018-03-01 DIAGNOSIS — H2512 Age-related nuclear cataract, left eye: Secondary | ICD-10-CM | POA: Diagnosis not present

## 2018-03-01 DIAGNOSIS — H44511 Absolute glaucoma, right eye: Secondary | ICD-10-CM | POA: Diagnosis not present

## 2018-03-01 DIAGNOSIS — E113312 Type 2 diabetes mellitus with moderate nonproliferative diabetic retinopathy with macular edema, left eye: Secondary | ICD-10-CM | POA: Diagnosis not present

## 2018-03-02 ENCOUNTER — Emergency Department (HOSPITAL_COMMUNITY)
Admission: EM | Admit: 2018-03-02 | Discharge: 2018-03-02 | Disposition: A | Discharge status: Home / Self Care | Payer: Medicare HMO | Attending: Emergency Medicine | Admitting: Emergency Medicine

## 2018-03-02 ENCOUNTER — Encounter (HOSPITAL_COMMUNITY): Payer: Self-pay | Admitting: *Deleted

## 2018-03-02 ENCOUNTER — Other Ambulatory Visit: Payer: Self-pay

## 2018-03-02 ENCOUNTER — Emergency Department (HOSPITAL_COMMUNITY): Payer: Medicare HMO

## 2018-03-02 DIAGNOSIS — I130 Hypertensive heart and chronic kidney disease with heart failure and stage 1 through stage 4 chronic kidney disease, or unspecified chronic kidney disease: Secondary | ICD-10-CM | POA: Insufficient documentation

## 2018-03-02 DIAGNOSIS — E1122 Type 2 diabetes mellitus with diabetic chronic kidney disease: Secondary | ICD-10-CM | POA: Insufficient documentation

## 2018-03-02 DIAGNOSIS — M47816 Spondylosis without myelopathy or radiculopathy, lumbar region: Secondary | ICD-10-CM | POA: Diagnosis not present

## 2018-03-02 DIAGNOSIS — Z794 Long term (current) use of insulin: Secondary | ICD-10-CM | POA: Insufficient documentation

## 2018-03-02 DIAGNOSIS — Z87891 Personal history of nicotine dependence: Secondary | ICD-10-CM | POA: Insufficient documentation

## 2018-03-02 DIAGNOSIS — Z7982 Long term (current) use of aspirin: Secondary | ICD-10-CM | POA: Diagnosis not present

## 2018-03-02 DIAGNOSIS — M5442 Lumbago with sciatica, left side: Secondary | ICD-10-CM | POA: Insufficient documentation

## 2018-03-02 DIAGNOSIS — Z951 Presence of aortocoronary bypass graft: Secondary | ICD-10-CM | POA: Insufficient documentation

## 2018-03-02 DIAGNOSIS — N183 Chronic kidney disease, stage 3 (moderate): Secondary | ICD-10-CM | POA: Diagnosis not present

## 2018-03-02 DIAGNOSIS — I5022 Chronic systolic (congestive) heart failure: Secondary | ICD-10-CM | POA: Diagnosis not present

## 2018-03-02 DIAGNOSIS — M545 Low back pain: Secondary | ICD-10-CM | POA: Diagnosis present

## 2018-03-02 HISTORY — DX: Unspecified glaucoma: H40.9

## 2018-03-02 LAB — URINALYSIS, ROUTINE W REFLEX MICROSCOPIC
Bacteria, UA: NONE SEEN
Bilirubin Urine: NEGATIVE
GLUCOSE, UA: NEGATIVE mg/dL
Hgb urine dipstick: NEGATIVE
Ketones, ur: NEGATIVE mg/dL
Leukocytes, UA: NEGATIVE
NITRITE: NEGATIVE
Protein, ur: 100 mg/dL — AB
Specific Gravity, Urine: 1.019 (ref 1.005–1.030)
pH: 5 (ref 5.0–8.0)

## 2018-03-02 MED ORDER — DICLOFENAC EPOLAMINE 1.3 % TD PTCH
1.0000 | MEDICATED_PATCH | Freq: Two times a day (BID) | TRANSDERMAL | Status: DC
  Administered 2018-03-02: 1 via TRANSDERMAL
  Filled 2018-03-02 (×7): qty 1

## 2018-03-02 MED ORDER — TRAMADOL HCL 50 MG PO TABS
50.0000 mg | ORAL_TABLET | Freq: Once | ORAL | Status: AC
  Administered 2018-03-02: 50 mg via ORAL
  Filled 2018-03-02: qty 1

## 2018-03-02 MED ORDER — TRAMADOL HCL 50 MG PO TABS: 50.0000 mg | ORAL_TABLET | Freq: Four times a day (QID) | ORAL | 0 refills | Status: DC | PRN

## 2018-03-02 MED ORDER — DICLOFENAC EPOLAMINE 1.3 % TD PTCH: 1.0000 | MEDICATED_PATCH | Freq: Two times a day (BID) | TRANSDERMAL | 1 refills | Status: DC

## 2018-03-02 NOTE — ED Notes (Signed)
To rad 

## 2018-03-02 NOTE — ED Notes (Signed)
Pt is informed meds coming from pharm  Her reports he needs something now Apologies for wait   Pt is attempting urination

## 2018-03-02 NOTE — ED Notes (Signed)
Son out of room reports pt has reported numb arm but cannot speak of time arm has been numb  Dr L in to assess

## 2018-03-02 NOTE — ED Notes (Signed)
spec to lab 

## 2018-03-02 NOTE — ED Provider Notes (Signed)
[facility] Provider Note   CSN: [id number] Arrival date & time: 03/02/18  1305     History   Chief Complaint Chief Complaint  Patient presents with  . Back Pain    HPI Steven Osborne is a 78 y.o. male.  HPI Patient presents with concern of back pain. He notes he has had prior episodes of back pain, he notes that over the 3 days, he has had unusual back pain, pain without clear precipitant, but worsening, without clear relieving or exacerbating factors, including OTC medication. Pain is focally in the left paraspinal lower back, with some radiation down the left buttocks. Pain is worse with ambulation, motion. No distal loss of sensation, no abdominal pain, no urinary complaints. No fever, no chills, no abdominal pain. He is here with his son who assists with the HPI. Patient notes that he does have episodes of left arm tingling, he specifies that these have been going on for quite some time, and they are not the issue that brings him here for evaluation today. Past Medical History:  Diagnosis Date  . ARTERIOSCLEROSIS 02/10/2010  . CAD 02/25/2010  . DIABETES TYPE 2 02/10/2010  . Glaucoma    right eye  . HYPERLIPIDEMIA NOS 02/10/2010  . HYPERTENSION 02/10/2010  . INTERMITTENT CLAUDICATION 02/10/2010  . MI, old     Patient Active Problem List   Diagnosis Date Noted  . Acute upper GI bleed   . GI bleed 12/17/2017  . CKD stage 3 due to type 2 diabetes mellitus (HCC) 09/25/2017  . Dehydration 09/24/2017  . Ischemic cardiomyopathy 09/24/2017  . Chronic systolic CHF (congestive heart failure) (HCC) 09/24/2017  . Type 2 diabetes mellitus with proliferative retinopathy (HCC) 09/24/2017  . Thrombocytopenia (HCC) 09/24/2017  . Hypotension   . Near syncope   . Diabetes (HCC) 06/09/2015  . Coronary atherosclerosis 02/25/2010  . HYPERLIPIDEMIA NOS 02/10/2010  . Essential hypertension 02/10/2010  . ARTERIOSCLEROSIS 02/10/2010  . INTERMITTENT CLAUDICATION  02/10/2010    Past Surgical History:  Procedure Laterality Date  . BIOPSY  12/18/2017   Procedure: BIOPSY;  Surgeon: West BaliFields, Sandi L, MD;  Location: [facility];  Service: Endoscopy;;  . CORONARY ARTERY BYPASS GRAFT  2005   4 vessel bypass   . ESOPHAGOGASTRODUODENOSCOPY N/A 12/18/2017   Procedure: ESOPHAGOGASTRODUODENOSCOPY (EGD);  Surgeon: West BaliFields, Sandi L, MD;  Location: [facility];  Service: Endoscopy;  Laterality: N/A;        Home Medications    Prior to Admission medications   Medication Sig Start Date End Date Taking? Authorizing Provider  aspirin EC 81 MG tablet Take 81 mg by mouth daily.   Yes [provider]  amoxicillin (AMOXIL) 500 MG tablet 2 PO BID FOR 10 DAYS Patient not taking: Reported on 03/02/2018 12/25/17   West BaliFields, Sandi L, MD  atorvastatin (LIPITOR) 40 MG tablet Take 1 tablet (40 mg total) by mouth daily at 6 PM. Patient not taking: Reported on 03/02/2018 09/26/17   Catarina Hartshornat, David, MD  carvedilol (COREG) 6.25 MG tablet Take 1 tablet (6.25 mg total) by mouth 2 (two) times daily with a meal. 09/26/17   Tat, Onalee Huaavid, MD  clarithromycin (BIAXIN) 500 MG tablet 1 PO BID FOR 10 DAYS. DO NOT TAKE LIPITOR WHILE TAKING BIAXIN. 12/25/17   Fields, Darleene CleaverSandi L, MD  diclofenac (FLECTOR) 1.3 % PTCH Place 1 patch onto the skin 2 (two) times daily. 03/02/18   Gerhard MunchLockwood, Ranee Peasley, MD  hydrALAZINE (APRESOLINE) 25 MG tablet Take 1 tablet (25 mg total)  by mouth every 8 (eight) hours. 09/26/17   Catarina Hartshornat, David, MD  HYDROcodone-acetaminophen (NORCO/VICODIN) 5-325 MG tablet Take 1 tablet by mouth every 8 (eight) hours as needed for moderate pain.    [provider]  insulin NPH Human (NOVOLIN N) 100 UNIT/ML injection Inject 0.2 mLs (20 Units total) into the skin 2 (two) times daily before a meal. 09/26/17   Tat, Onalee Huaavid, MD  isosorbide mononitrate (IMDUR) 30 MG 24 hr tablet Take 30 mg by mouth daily.  12/11/15   [provider]  LORazepam (ATIVAN) 0.5 MG tablet Take 0.5 mg by mouth 2 (two)  times daily.    [provider]  Omega-3 Fatty Acids (FISH OIL PO) Take 1 capsule by mouth daily.    [provider]  pantoprazole (PROTONIX) 40 MG tablet Take 1 tablet (40 mg total) by mouth 2 (two) times daily before a meal. 12/19/17   Erick BlinksMemon, Jehanzeb, MD  timolol (TIMOPTIC) 0.25 % ophthalmic solution Place 1 drop into the right eye 2 (two) times daily.  08/01/11   [provider]  traMADol (ULTRAM) 50 MG tablet Take 1 tablet (50 mg total) by mouth every 6 (six) hours as needed. 03/02/18   Gerhard MunchLockwood, Taequan Stockhausen, MD  vitamin C (ASCORBIC ACID) 500 MG tablet Take 500 mg by mouth daily.    [provider]  VITAMIN E PO Take 1 tablet by mouth daily.    [provider]    Family History Family History  Problem Relation Age of Onset  . Diabetes Mother   . Heart disease Mother   . Diabetes Father   . Heart disease Father   . Stroke Father   . Diabetes Sister   . Diabetes Brother   . Hypertension Other   . Colon cancer Neg Hx   . Colon polyps Neg Hx     Social History Social History   Tobacco Use  . Smoking status: Former Smoker    Types: Cigars  . Smokeless tobacco: Former NeurosurgeonUser    Types: Chew    Quit date: 02/21/2014  Substance Use Topics  . Alcohol use: No    Comment: as a teenager to late 20s, none since   . Drug use: No     Allergies   Crestor [rosuvastatin calcium]   Review of Systems Review of Systems  Constitutional:       Per HPI, otherwise negative  HENT:       Per HPI, otherwise negative  Respiratory:       Per HPI, otherwise negative  Cardiovascular:       Per HPI, otherwise negative  Gastrointestinal: Negative for vomiting.  Endocrine:       Negative aside from HPI  Genitourinary:       Neg aside from HPI   Musculoskeletal:       Per HPI, otherwise negative  Skin: Negative.   Neurological: Negative for syncope.     Physical Exam Updated Vital Signs BP 117/63 (BP Location: Right Arm)   Pulse 76   Temp 98.2  F (36.8 C) (Oral)   Resp 18   Ht 5\' 10"  (1.778 m)   Wt 95.3 kg   SpO2 95%   BMI 30.13 kg/m   Physical Exam Vitals signs and nursing note reviewed.  Constitutional:      General: He is not in acute distress.    Appearance: He is well-developed.  HENT:     Head: Normocephalic and atraumatic.  Eyes:     Conjunctiva/sclera: Conjunctivae  normal.  Cardiovascular:     Rate and Rhythm: Normal rate and regular rhythm.  Pulmonary:     Effort: Pulmonary effort is normal. No respiratory distress.     Breath sounds: No stridor.  Abdominal:     General: There is no distension.  Musculoskeletal:     Comments: Unremarkable low back exam, on palpation, and right leg is unremarkable, including with no referred pain with hip flexion. Left leg unremarkable, though with hip flexion on the left, particular against resistance, there is pain referred to the left lower back.   Skin:    General: Skin is warm and dry.  Neurological:     Mental Status: He is alert and oriented to person, place, and time.      ED Treatments / Results  Labs (all labs ordered are listed, but only abnormal results are displayed) Labs Reviewed  URINALYSIS, ROUTINE W REFLEX MICROSCOPIC - Abnormal; Notable for the following components:      Result Value   Protein, ur 100 (*)    All other components within normal limits    Radiology Dg Lumbar Spine 2-3 Views  Result Date: 03/02/2018 CLINICAL DATA:  New onset back pain. EXAM: LUMBAR SPINE - 2-3 VIEW COMPARISON:  Lumbar spine 04/24/2017. FINDINGS: Lumbar spine numbered as per prior exam. Soft tissue structures are unremarkable. Degenerative change thoracolumbar spine. Stable mild L1 compression fracture. No acute bony abnormality identified. Aortoiliac atherosclerotic vascular calcification. IMPRESSION: 1.  Diffuse multilevel degenerative change again noted. 2.  Stable old mild L1 compression fracture.  No acute abnormality. 3.  Aortoiliac atherosclerotic vascular  disease. Electronically Signed   By: Maisie Fus  Register   On: 03/02/2018 14:43    Procedures Procedures (including critical care time)  Medications Ordered in ED Medications  diclofenac (FLECTOR) 1.3 % 1 patch (1 patch Transdermal Patch Applied 03/02/18 1413)  traMADol (ULTRAM) tablet 50 mg (has no administration in time range)     Initial Impression / Assessment and Plan / ED Course  I have reviewed the triage vital signs and the nursing notes.  Pertinent labs & imaging results that were available during my care of the patient were reviewed by me and considered in my medical decision making (see chart for details).  On repeat exam the patient is awake, alert, in no distress, now aware of all findings including reassuring urinalysis, x-ray. Some suspicion for musculoskeletal etiology given the absence of neurologic red flags, distress, hemodynamic instability. Patient was start topical diclofenac, tramadol, follow-up with primary care and orthopedics.  Final Clinical Impressions(s) / ED Diagnoses   Final diagnoses:  Acute left-sided low back pain with left-sided sciatica    ED Discharge Orders         Ordered    traMADol (ULTRAM) 50 MG tablet  Every 6 hours PRN     03/02/18 1517    diclofenac (FLECTOR) 1.3 % PTCH  2 times daily     03/02/18 1524           Gerhard Munch, MD 03/02/18 1526

## 2018-03-02 NOTE — Discharge Instructions (Addendum)
As discussed, your evaluation today has been largely reassuring.  But, it is important that you monitor your condition carefully, and do not hesitate to return to the ED if you develop new, or concerning changes in your condition.  Please continue to use the provided medication patches, every 12 hours for the next 3 days.  In addition use the prescribed medication for additional pain relief.  Otherwise, please follow-up with your physicians for appropriate ongoing care.

## 2018-03-02 NOTE — ED Notes (Signed)
Call to pharm They will bring patch

## 2018-03-02 NOTE — ED Notes (Signed)
Pt reports back problems for 6 months  Has not seen PCP for same   Larey Seat out of bed and has had pain for the last 3 days  Denies incontinence Denies numbness  Reports pain to L side, back and into hip  Here for evaluation

## 2018-03-02 NOTE — ED Triage Notes (Addendum)
Pt c/o lower middle back pain that radiates into left buttock x 6 months, worsening over the last month after he fell off a bed. Pt has used arthritis cream and Tylenol without any relief. Denies dysuria, hematuria.

## 2018-03-12 DIAGNOSIS — B349 Viral infection, unspecified: Secondary | ICD-10-CM | POA: Diagnosis not present

## 2018-03-14 DIAGNOSIS — M5136 Other intervertebral disc degeneration, lumbar region: Secondary | ICD-10-CM | POA: Diagnosis not present

## 2018-03-14 DIAGNOSIS — E139 Other specified diabetes mellitus without complications: Secondary | ICD-10-CM | POA: Diagnosis not present

## 2018-03-14 DIAGNOSIS — K219 Gastro-esophageal reflux disease without esophagitis: Secondary | ICD-10-CM | POA: Diagnosis not present

## 2018-03-14 DIAGNOSIS — I10 Essential (primary) hypertension: Secondary | ICD-10-CM | POA: Diagnosis not present

## 2018-03-21 ENCOUNTER — Telehealth: Payer: Self-pay | Admitting: Gastroenterology

## 2018-03-21 ENCOUNTER — Ambulatory Visit: Payer: Medicare HMO | Admitting: Gastroenterology

## 2018-03-21 NOTE — Telephone Encounter (Signed)
Reviewed

## 2018-03-21 NOTE — Telephone Encounter (Signed)
Patient was on the schedule for 8am, had been scheduled since October.  Another office called after 8am and said he was at their office.  Patient came in and I told him we would have to reschedule and he said "Yall cant work me in after I have been all over the place, forget it, i'm not coming back" and he left

## 2018-03-21 NOTE — Telephone Encounter (Signed)
Noted. He was a hospital follow-up. I don't believe he has ever been to this office.

## 2018-03-27 DIAGNOSIS — I10 Essential (primary) hypertension: Secondary | ICD-10-CM | POA: Diagnosis not present

## 2018-03-27 DIAGNOSIS — K922 Gastrointestinal hemorrhage, unspecified: Secondary | ICD-10-CM | POA: Diagnosis not present

## 2018-03-27 DIAGNOSIS — E785 Hyperlipidemia, unspecified: Secondary | ICD-10-CM | POA: Diagnosis not present

## 2018-03-27 DIAGNOSIS — T39395A Adverse effect of other nonsteroidal anti-inflammatory drugs [NSAID], initial encounter: Secondary | ICD-10-CM | POA: Diagnosis not present

## 2018-03-27 DIAGNOSIS — I2510 Atherosclerotic heart disease of native coronary artery without angina pectoris: Secondary | ICD-10-CM | POA: Diagnosis not present

## 2018-03-27 DIAGNOSIS — I255 Ischemic cardiomyopathy: Secondary | ICD-10-CM | POA: Diagnosis not present

## 2018-04-08 DIAGNOSIS — E119 Type 2 diabetes mellitus without complications: Secondary | ICD-10-CM | POA: Diagnosis not present

## 2018-04-26 DIAGNOSIS — Z87891 Personal history of nicotine dependence: Secondary | ICD-10-CM | POA: Diagnosis not present

## 2018-04-26 DIAGNOSIS — H2512 Age-related nuclear cataract, left eye: Secondary | ICD-10-CM | POA: Diagnosis not present

## 2018-04-26 DIAGNOSIS — Z794 Long term (current) use of insulin: Secondary | ICD-10-CM | POA: Diagnosis not present

## 2018-04-26 DIAGNOSIS — Z9889 Other specified postprocedural states: Secondary | ICD-10-CM | POA: Diagnosis not present

## 2018-04-26 DIAGNOSIS — E11311 Type 2 diabetes mellitus with unspecified diabetic retinopathy with macular edema: Secondary | ICD-10-CM | POA: Diagnosis not present

## 2018-04-26 DIAGNOSIS — H1830 Unspecified corneal membrane change: Secondary | ICD-10-CM | POA: Diagnosis not present

## 2018-04-26 DIAGNOSIS — H44511 Absolute glaucoma, right eye: Secondary | ICD-10-CM | POA: Diagnosis not present

## 2018-04-26 DIAGNOSIS — E113312 Type 2 diabetes mellitus with moderate nonproliferative diabetic retinopathy with macular edema, left eye: Secondary | ICD-10-CM | POA: Diagnosis not present

## 2018-05-03 DIAGNOSIS — E113319 Type 2 diabetes mellitus with moderate nonproliferative diabetic retinopathy with macular edema, unspecified eye: Secondary | ICD-10-CM | POA: Diagnosis not present

## 2018-05-03 DIAGNOSIS — H44511 Absolute glaucoma, right eye: Secondary | ICD-10-CM | POA: Diagnosis not present

## 2018-05-03 DIAGNOSIS — H1820 Unspecified corneal edema: Secondary | ICD-10-CM | POA: Diagnosis not present

## 2018-05-03 DIAGNOSIS — Z794 Long term (current) use of insulin: Secondary | ICD-10-CM | POA: Diagnosis not present

## 2018-05-03 DIAGNOSIS — E113312 Type 2 diabetes mellitus with moderate nonproliferative diabetic retinopathy with macular edema, left eye: Secondary | ICD-10-CM | POA: Diagnosis not present

## 2018-05-03 DIAGNOSIS — H2512 Age-related nuclear cataract, left eye: Secondary | ICD-10-CM | POA: Diagnosis not present

## 2018-05-08 DIAGNOSIS — G44219 Episodic tension-type headache, not intractable: Secondary | ICD-10-CM | POA: Diagnosis not present

## 2018-05-08 DIAGNOSIS — H40033 Anatomical narrow angle, bilateral: Secondary | ICD-10-CM | POA: Diagnosis not present

## 2018-05-11 DIAGNOSIS — H04123 Dry eye syndrome of bilateral lacrimal glands: Secondary | ICD-10-CM | POA: Diagnosis not present

## 2018-05-12 DIAGNOSIS — I2510 Atherosclerotic heart disease of native coronary artery without angina pectoris: Secondary | ICD-10-CM | POA: Diagnosis not present

## 2018-05-12 DIAGNOSIS — N179 Acute kidney failure, unspecified: Secondary | ICD-10-CM | POA: Diagnosis not present

## 2018-05-12 DIAGNOSIS — Z951 Presence of aortocoronary bypass graft: Secondary | ICD-10-CM | POA: Diagnosis not present

## 2018-05-12 DIAGNOSIS — J340 Abscess, furuncle and carbuncle of nose: Secondary | ICD-10-CM | POA: Diagnosis not present

## 2018-05-12 DIAGNOSIS — R410 Disorientation, unspecified: Secondary | ICD-10-CM | POA: Diagnosis not present

## 2018-05-12 DIAGNOSIS — I9589 Other hypotension: Secondary | ICD-10-CM | POA: Diagnosis not present

## 2018-05-12 DIAGNOSIS — R4189 Other symptoms and signs involving cognitive functions and awareness: Secondary | ICD-10-CM | POA: Diagnosis not present

## 2018-05-12 DIAGNOSIS — I10 Essential (primary) hypertension: Secondary | ICD-10-CM | POA: Diagnosis not present

## 2018-05-12 DIAGNOSIS — R001 Bradycardia, unspecified: Secondary | ICD-10-CM | POA: Diagnosis not present

## 2018-05-12 DIAGNOSIS — I959 Hypotension, unspecified: Secondary | ICD-10-CM | POA: Diagnosis not present

## 2018-05-12 DIAGNOSIS — R918 Other nonspecific abnormal finding of lung field: Secondary | ICD-10-CM | POA: Diagnosis not present

## 2018-05-12 DIAGNOSIS — E119 Type 2 diabetes mellitus without complications: Secondary | ICD-10-CM | POA: Diagnosis not present

## 2018-05-12 DIAGNOSIS — J3489 Other specified disorders of nose and nasal sinuses: Secondary | ICD-10-CM | POA: Diagnosis not present

## 2018-05-12 DIAGNOSIS — R55 Syncope and collapse: Secondary | ICD-10-CM | POA: Diagnosis not present

## 2018-05-12 DIAGNOSIS — R0989 Other specified symptoms and signs involving the circulatory and respiratory systems: Secondary | ICD-10-CM | POA: Diagnosis not present

## 2018-05-13 DIAGNOSIS — R4189 Other symptoms and signs involving cognitive functions and awareness: Secondary | ICD-10-CM | POA: Diagnosis not present

## 2018-05-13 DIAGNOSIS — Z794 Long term (current) use of insulin: Secondary | ICD-10-CM | POA: Diagnosis not present

## 2018-05-13 DIAGNOSIS — E1159 Type 2 diabetes mellitus with other circulatory complications: Secondary | ICD-10-CM | POA: Diagnosis not present

## 2018-05-13 DIAGNOSIS — J340 Abscess, furuncle and carbuncle of nose: Secondary | ICD-10-CM | POA: Diagnosis not present

## 2018-05-13 DIAGNOSIS — I10 Essential (primary) hypertension: Secondary | ICD-10-CM | POA: Diagnosis not present

## 2018-05-13 DIAGNOSIS — N179 Acute kidney failure, unspecified: Secondary | ICD-10-CM | POA: Diagnosis not present

## 2018-05-13 DIAGNOSIS — R001 Bradycardia, unspecified: Secondary | ICD-10-CM | POA: Diagnosis not present

## 2018-05-14 DIAGNOSIS — R001 Bradycardia, unspecified: Secondary | ICD-10-CM | POA: Diagnosis not present

## 2018-05-14 DIAGNOSIS — Z794 Long term (current) use of insulin: Secondary | ICD-10-CM | POA: Diagnosis not present

## 2018-05-14 DIAGNOSIS — I10 Essential (primary) hypertension: Secondary | ICD-10-CM | POA: Diagnosis not present

## 2018-05-14 DIAGNOSIS — I5189 Other ill-defined heart diseases: Secondary | ICD-10-CM | POA: Diagnosis not present

## 2018-05-14 DIAGNOSIS — N179 Acute kidney failure, unspecified: Secondary | ICD-10-CM | POA: Diagnosis not present

## 2018-05-14 DIAGNOSIS — E1159 Type 2 diabetes mellitus with other circulatory complications: Secondary | ICD-10-CM | POA: Diagnosis not present

## 2018-05-14 DIAGNOSIS — J340 Abscess, furuncle and carbuncle of nose: Secondary | ICD-10-CM | POA: Diagnosis not present

## 2018-05-14 DIAGNOSIS — I517 Cardiomegaly: Secondary | ICD-10-CM | POA: Diagnosis not present

## 2018-05-14 DIAGNOSIS — I081 Rheumatic disorders of both mitral and tricuspid valves: Secondary | ICD-10-CM | POA: Diagnosis not present

## 2018-05-14 DIAGNOSIS — I519 Heart disease, unspecified: Secondary | ICD-10-CM | POA: Diagnosis not present

## 2018-05-14 DIAGNOSIS — R4189 Other symptoms and signs involving cognitive functions and awareness: Secondary | ICD-10-CM | POA: Diagnosis not present

## 2018-05-15 DIAGNOSIS — J340 Abscess, furuncle and carbuncle of nose: Secondary | ICD-10-CM | POA: Diagnosis not present

## 2018-05-15 DIAGNOSIS — I10 Essential (primary) hypertension: Secondary | ICD-10-CM | POA: Diagnosis not present

## 2018-05-15 DIAGNOSIS — Z794 Long term (current) use of insulin: Secondary | ICD-10-CM | POA: Diagnosis not present

## 2018-05-15 DIAGNOSIS — I2510 Atherosclerotic heart disease of native coronary artery without angina pectoris: Secondary | ICD-10-CM | POA: Diagnosis not present

## 2018-05-15 DIAGNOSIS — E1159 Type 2 diabetes mellitus with other circulatory complications: Secondary | ICD-10-CM | POA: Diagnosis not present

## 2018-05-15 DIAGNOSIS — N179 Acute kidney failure, unspecified: Secondary | ICD-10-CM | POA: Diagnosis not present

## 2018-05-15 DIAGNOSIS — R001 Bradycardia, unspecified: Secondary | ICD-10-CM | POA: Diagnosis not present

## 2018-05-15 DIAGNOSIS — I9589 Other hypotension: Secondary | ICD-10-CM | POA: Diagnosis not present

## 2018-05-16 DIAGNOSIS — Z794 Long term (current) use of insulin: Secondary | ICD-10-CM | POA: Diagnosis not present

## 2018-05-16 DIAGNOSIS — I2510 Atherosclerotic heart disease of native coronary artery without angina pectoris: Secondary | ICD-10-CM | POA: Diagnosis not present

## 2018-05-16 DIAGNOSIS — E1159 Type 2 diabetes mellitus with other circulatory complications: Secondary | ICD-10-CM | POA: Diagnosis not present

## 2018-05-16 DIAGNOSIS — N179 Acute kidney failure, unspecified: Secondary | ICD-10-CM | POA: Diagnosis not present

## 2018-05-16 DIAGNOSIS — R001 Bradycardia, unspecified: Secondary | ICD-10-CM | POA: Diagnosis not present

## 2018-05-16 DIAGNOSIS — J340 Abscess, furuncle and carbuncle of nose: Secondary | ICD-10-CM | POA: Diagnosis not present

## 2018-05-16 DIAGNOSIS — I9589 Other hypotension: Secondary | ICD-10-CM | POA: Diagnosis not present

## 2018-05-16 DIAGNOSIS — I10 Essential (primary) hypertension: Secondary | ICD-10-CM | POA: Diagnosis not present

## 2018-05-22 DIAGNOSIS — E139 Other specified diabetes mellitus without complications: Secondary | ICD-10-CM | POA: Diagnosis not present

## 2018-05-22 DIAGNOSIS — K219 Gastro-esophageal reflux disease without esophagitis: Secondary | ICD-10-CM | POA: Diagnosis not present

## 2018-05-22 DIAGNOSIS — E7800 Pure hypercholesterolemia, unspecified: Secondary | ICD-10-CM | POA: Diagnosis not present

## 2018-05-22 DIAGNOSIS — I25810 Atherosclerosis of coronary artery bypass graft(s) without angina pectoris: Secondary | ICD-10-CM | POA: Diagnosis not present

## 2018-05-22 DIAGNOSIS — F329 Major depressive disorder, single episode, unspecified: Secondary | ICD-10-CM | POA: Diagnosis not present

## 2018-05-22 DIAGNOSIS — I10 Essential (primary) hypertension: Secondary | ICD-10-CM | POA: Diagnosis not present

## 2018-06-06 DIAGNOSIS — I255 Ischemic cardiomyopathy: Secondary | ICD-10-CM | POA: Diagnosis not present

## 2018-06-06 DIAGNOSIS — T39395A Adverse effect of other nonsteroidal anti-inflammatory drugs [NSAID], initial encounter: Secondary | ICD-10-CM | POA: Diagnosis not present

## 2018-06-06 DIAGNOSIS — I10 Essential (primary) hypertension: Secondary | ICD-10-CM | POA: Diagnosis not present

## 2018-06-06 DIAGNOSIS — K922 Gastrointestinal hemorrhage, unspecified: Secondary | ICD-10-CM | POA: Diagnosis not present

## 2018-06-06 DIAGNOSIS — E785 Hyperlipidemia, unspecified: Secondary | ICD-10-CM | POA: Diagnosis not present

## 2018-06-06 DIAGNOSIS — R55 Syncope and collapse: Secondary | ICD-10-CM | POA: Diagnosis not present

## 2018-06-06 DIAGNOSIS — I2510 Atherosclerotic heart disease of native coronary artery without angina pectoris: Secondary | ICD-10-CM | POA: Diagnosis not present

## 2018-06-07 DIAGNOSIS — E113312 Type 2 diabetes mellitus with moderate nonproliferative diabetic retinopathy with macular edema, left eye: Secondary | ICD-10-CM | POA: Diagnosis not present

## 2018-06-07 DIAGNOSIS — I255 Ischemic cardiomyopathy: Secondary | ICD-10-CM | POA: Diagnosis not present

## 2018-06-07 DIAGNOSIS — H18891 Other specified disorders of cornea, right eye: Secondary | ICD-10-CM | POA: Diagnosis not present

## 2018-06-07 DIAGNOSIS — K922 Gastrointestinal hemorrhage, unspecified: Secondary | ICD-10-CM | POA: Diagnosis not present

## 2018-06-07 DIAGNOSIS — T39395A Adverse effect of other nonsteroidal anti-inflammatory drugs [NSAID], initial encounter: Secondary | ICD-10-CM | POA: Diagnosis not present

## 2018-06-07 DIAGNOSIS — R55 Syncope and collapse: Secondary | ICD-10-CM | POA: Diagnosis not present

## 2018-06-07 DIAGNOSIS — H2512 Age-related nuclear cataract, left eye: Secondary | ICD-10-CM | POA: Diagnosis not present

## 2018-06-07 DIAGNOSIS — E785 Hyperlipidemia, unspecified: Secondary | ICD-10-CM | POA: Diagnosis not present

## 2018-06-07 DIAGNOSIS — Z794 Long term (current) use of insulin: Secondary | ICD-10-CM | POA: Diagnosis not present

## 2018-06-07 DIAGNOSIS — I2510 Atherosclerotic heart disease of native coronary artery without angina pectoris: Secondary | ICD-10-CM | POA: Diagnosis not present

## 2018-06-07 DIAGNOSIS — H44511 Absolute glaucoma, right eye: Secondary | ICD-10-CM | POA: Diagnosis not present

## 2018-06-07 DIAGNOSIS — E113519 Type 2 diabetes mellitus with proliferative diabetic retinopathy with macular edema, unspecified eye: Secondary | ICD-10-CM | POA: Diagnosis not present

## 2018-06-07 DIAGNOSIS — I10 Essential (primary) hypertension: Secondary | ICD-10-CM | POA: Diagnosis not present

## 2018-06-13 DIAGNOSIS — K219 Gastro-esophageal reflux disease without esophagitis: Secondary | ICD-10-CM | POA: Diagnosis not present

## 2018-06-13 DIAGNOSIS — E139 Other specified diabetes mellitus without complications: Secondary | ICD-10-CM | POA: Diagnosis not present

## 2018-06-13 DIAGNOSIS — F329 Major depressive disorder, single episode, unspecified: Secondary | ICD-10-CM | POA: Diagnosis not present

## 2018-06-13 DIAGNOSIS — I10 Essential (primary) hypertension: Secondary | ICD-10-CM | POA: Diagnosis not present

## 2018-06-13 DIAGNOSIS — I25810 Atherosclerosis of coronary artery bypass graft(s) without angina pectoris: Secondary | ICD-10-CM | POA: Diagnosis not present

## 2018-06-13 DIAGNOSIS — M5136 Other intervertebral disc degeneration, lumbar region: Secondary | ICD-10-CM | POA: Diagnosis not present

## 2018-06-21 DIAGNOSIS — I493 Ventricular premature depolarization: Secondary | ICD-10-CM | POA: Diagnosis not present

## 2019-03-14 IMAGING — CT CT HEAD W/O CM
3 series · 15 of 47 positions shown, 18 images · non-contrast
Comparison: None.

CLINICAL DATA: Generalized weakness.  Hypotension.  Dizziness.

EXAM:
CT HEAD WITHOUT CONTRAST
TECHNIQUE: Contiguous axial images were obtained from the base of the skull
through the vertex without intravenous contrast.

[Series 2: head wo · axial · 0.42mm/px · z∈[+1326,+1451]mm · 9 of 31 slices shown, 12 images]
[im 3/31  brain]
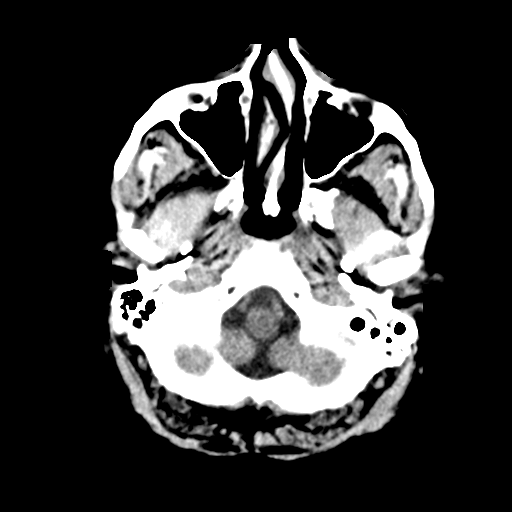
[im 3/31  bone]
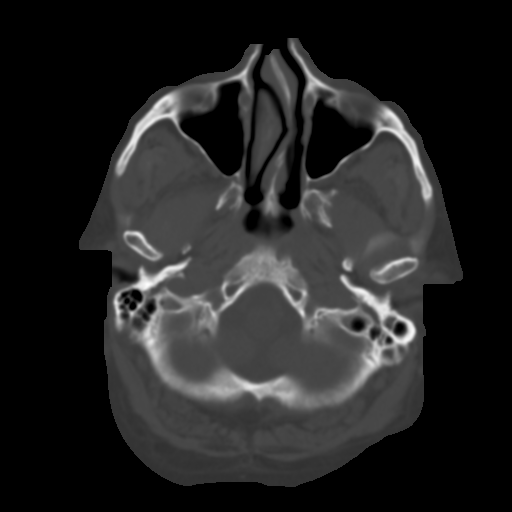
[im 6/31  brain]
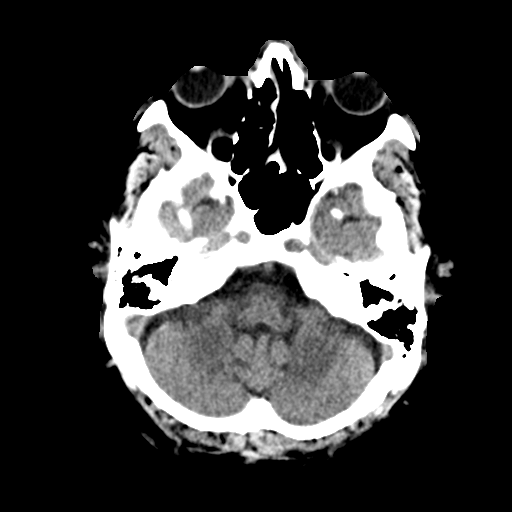
[im 9/31  brain]
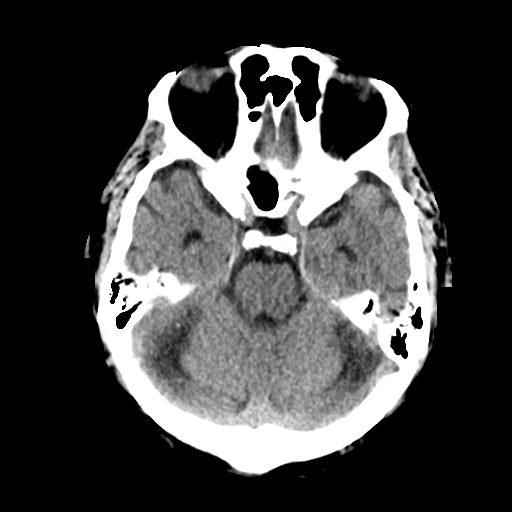
[im 12/31  brain]
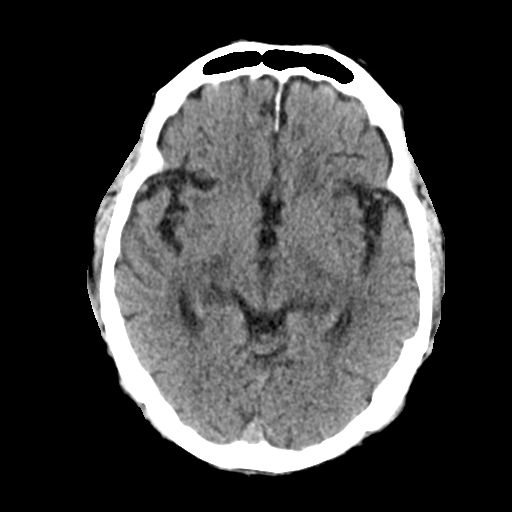
[im 16/31  brain]
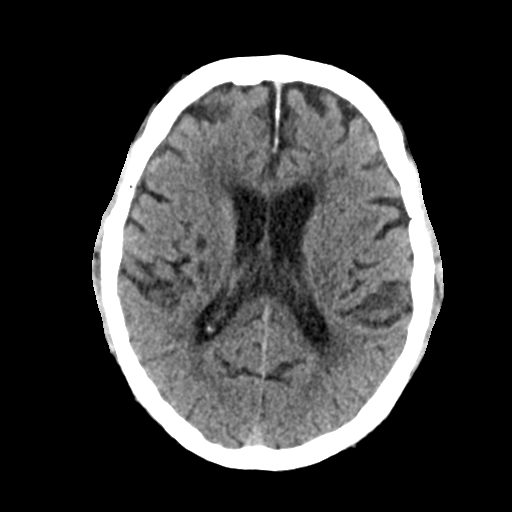
[im 16/31  bone]
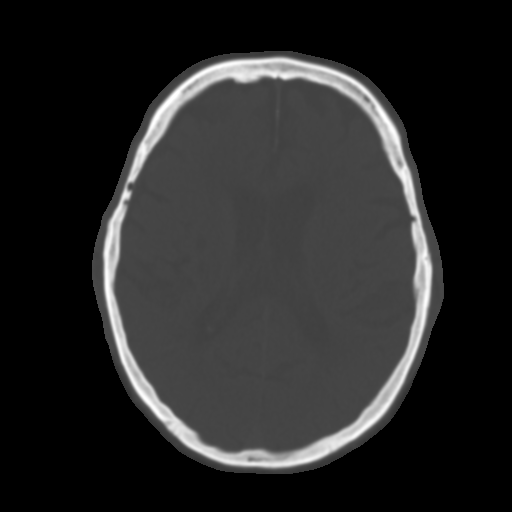
[im 19/31  brain]
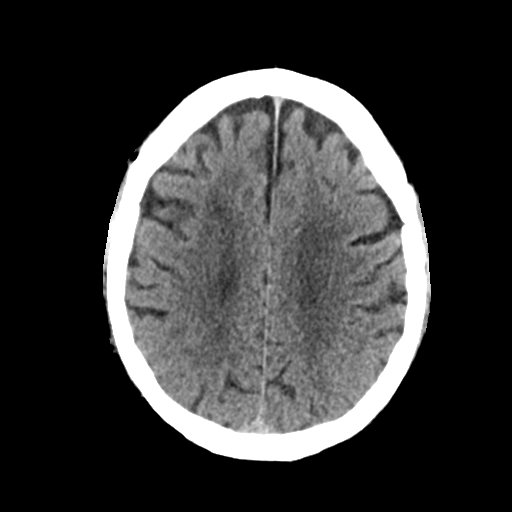
[im 22/31  brain]
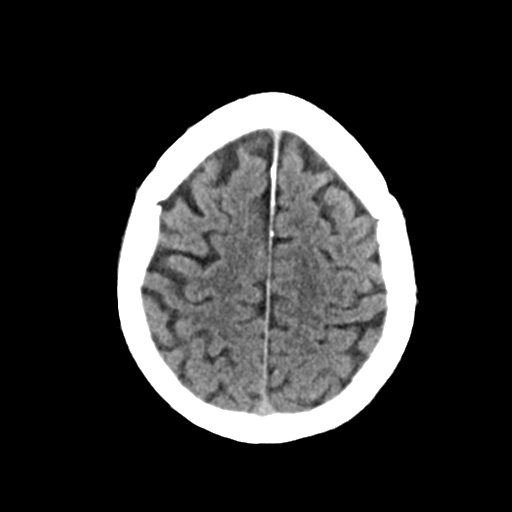
[im 25/31  brain]
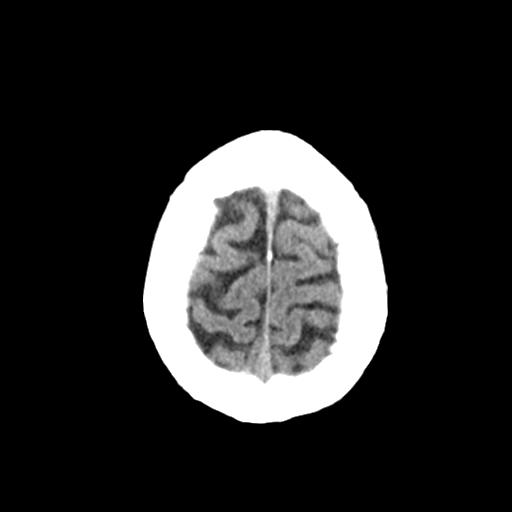
[im 28/31  brain]
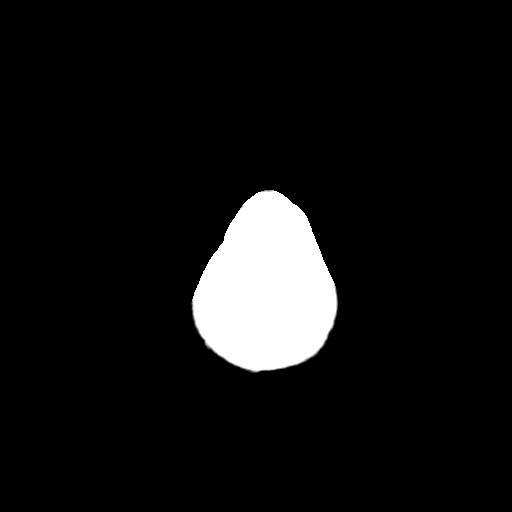
[im 28/31  bone]
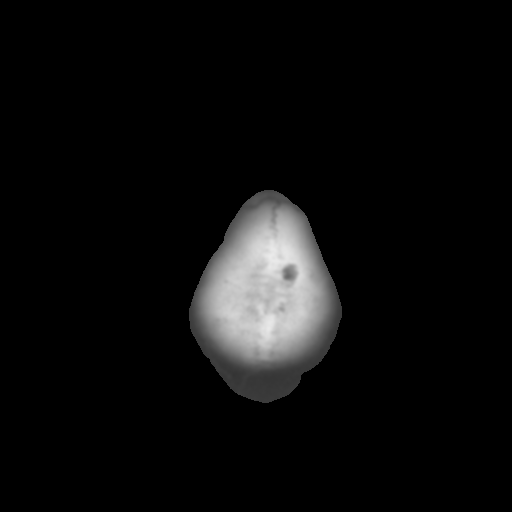

[Series 4: coronal soft tissue · coronal · 0.31mm/px · 3 of 70 slices shown]
[im 24/70  brain]
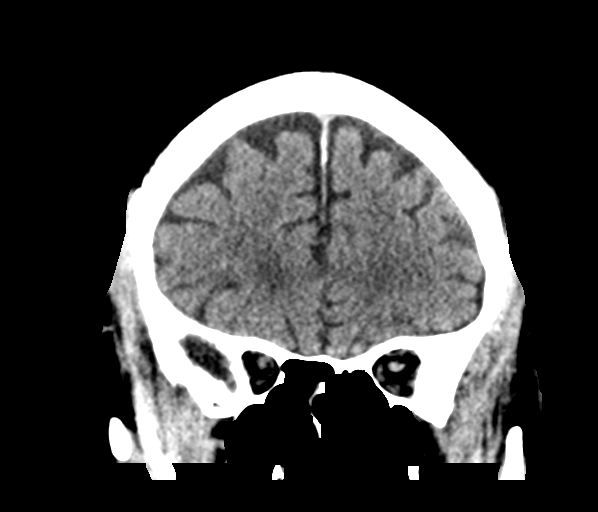
[im 31/70  brain]
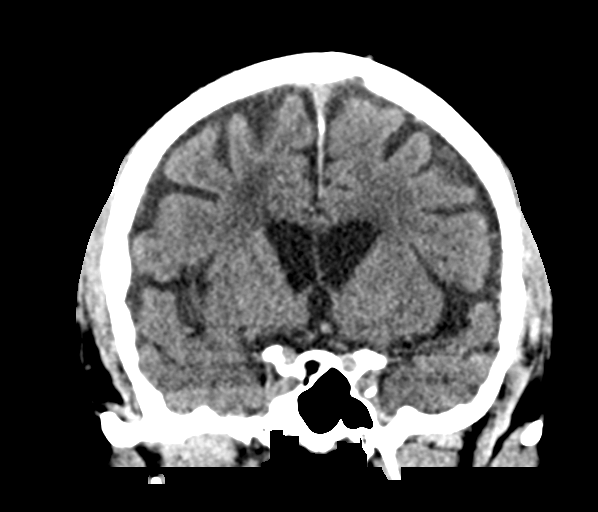
[im 39/70  brain]
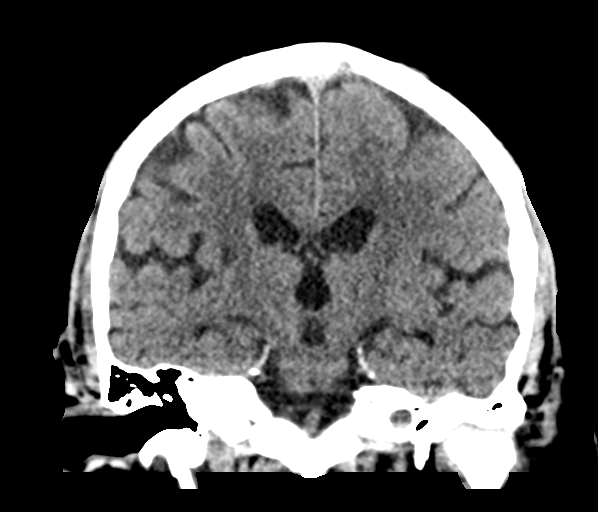

[Series 5: sagittal soft tissue · sagittal · 0.34mm/px · 3 of 67 slices shown]
[im 23/67  brain]
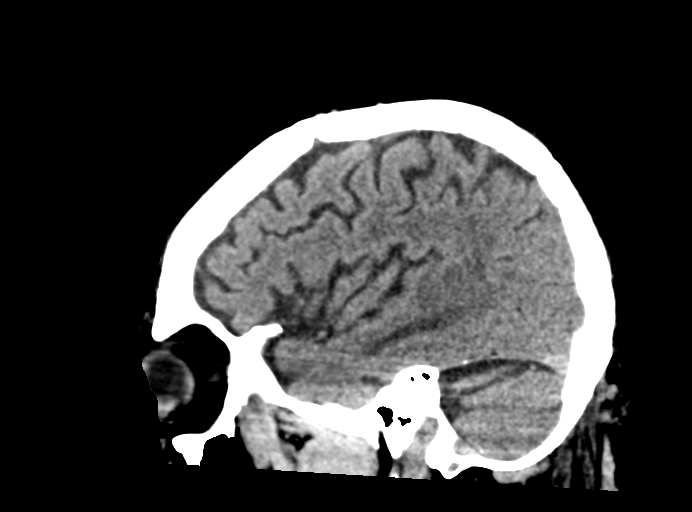
[im 34/67  brain]
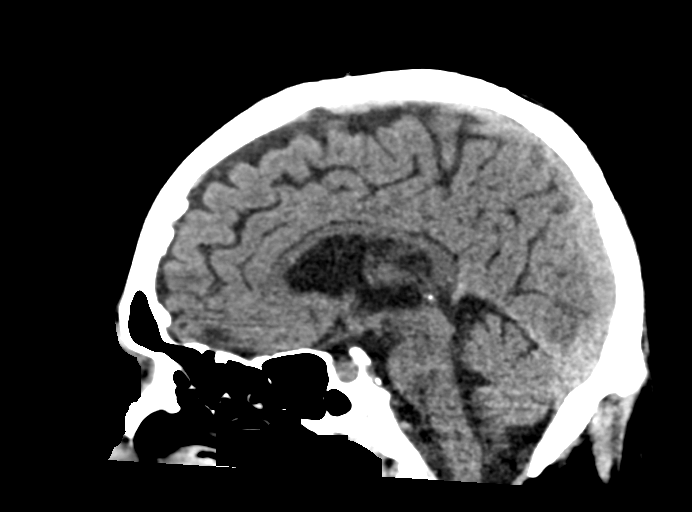
[im 45/67  brain]
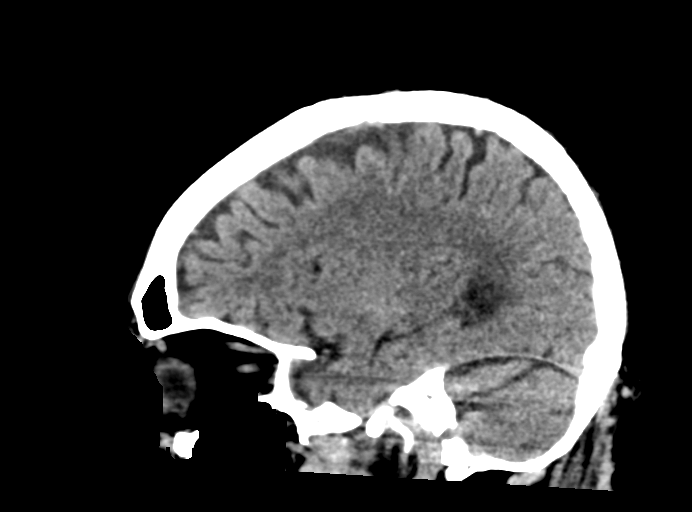

[15 of 47 positions shown; findings below may reference images not displayed]

FINDINGS: Brain: There is no evidence of acute large territory infarct,
intracranial hemorrhage, mass, midline shift, or extra-axial fluid
collection. Patchy bilateral cerebral white matter hypodensities are
non-specific but compatible with moderate chronic small vessel
ischemic disease. Lacunar infarcts in the right corona
radiata/external capsule are most likely chronic. Generalized
cerebral atrophy is mild for age.

Vascular: Calcified atherosclerosis at the skull base. No hyperdense
vessel.

Skull: No fracture or focal osseous lesion.

Sinuses/Orbits: Visualized paranasal sinuses and mastoid air cells
are clear. Orbits are unremarkable.

Other: None.
IMPRESSION: 1. No evidence of acute intracranial abnormality.
2. Moderate chronic small vessel ischemic disease.

## 2019-07-03 IMAGING — US US ABDOMEN COMPLETE
1 series · 13 of 25 positions shown · non-contrast
Comparison: None.

CLINICAL DATA: Renal failure, low platelets, hyperlipidemia,
diabetes.

EXAM:
ABDOMEN ULTRASOUND COMPLETE

[Series 1: us abdomen complete · 0.17mm/px · 13 of 125 slices shown]
[im 1/125]
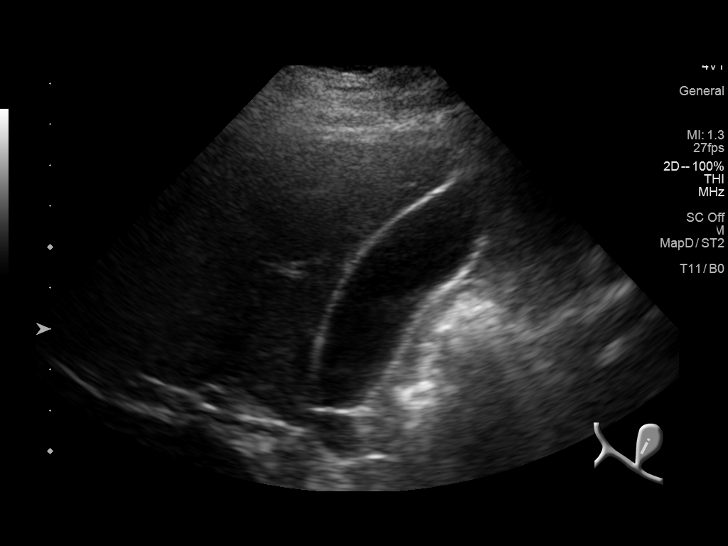
[im 11/125]
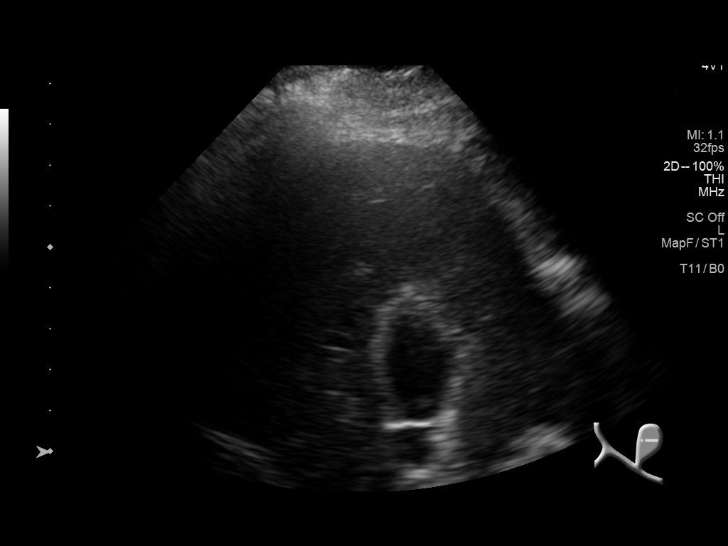
[im 21/125]
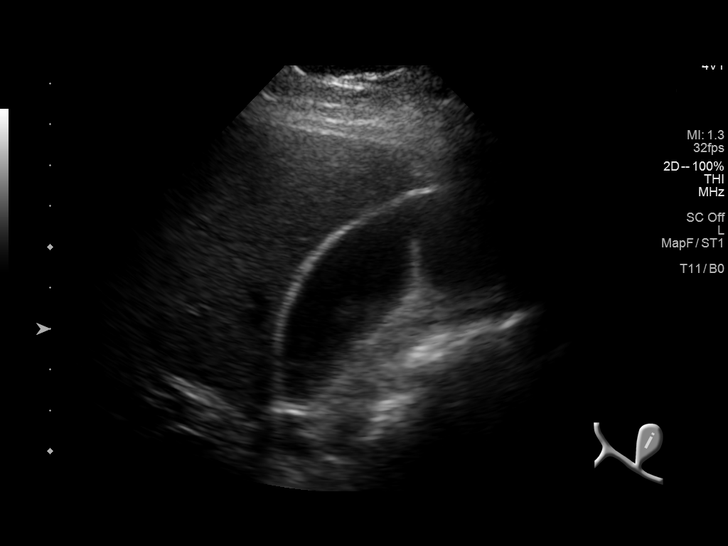
[im 32/125]
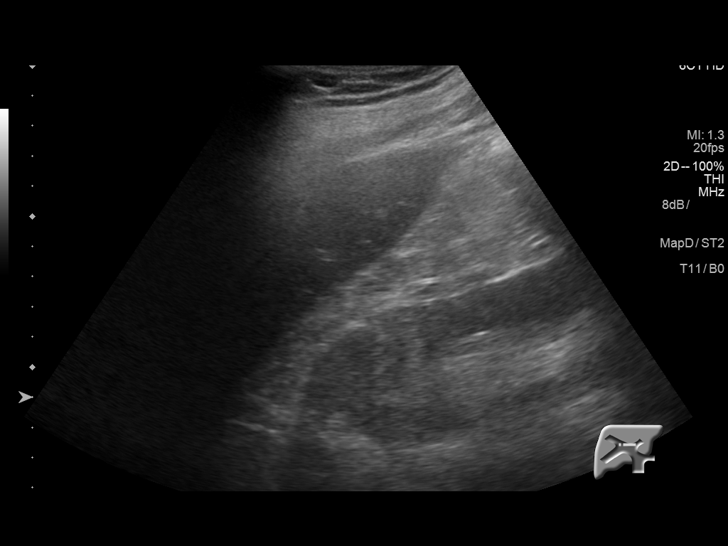
[im 42/125]
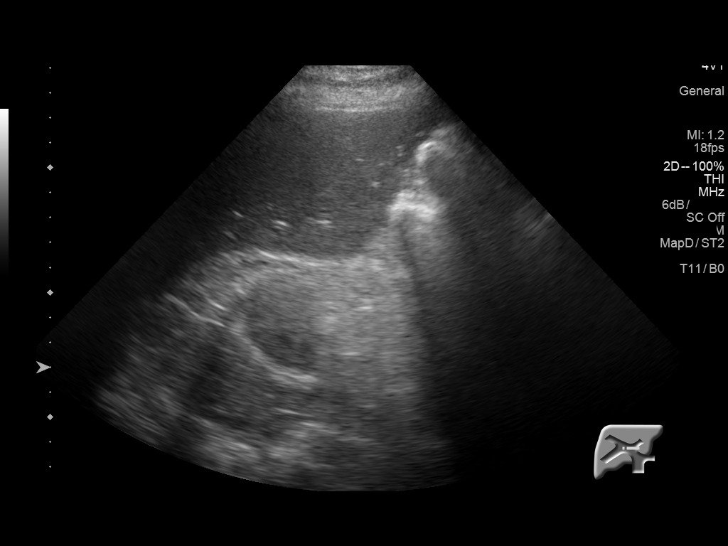
[im 52/125]
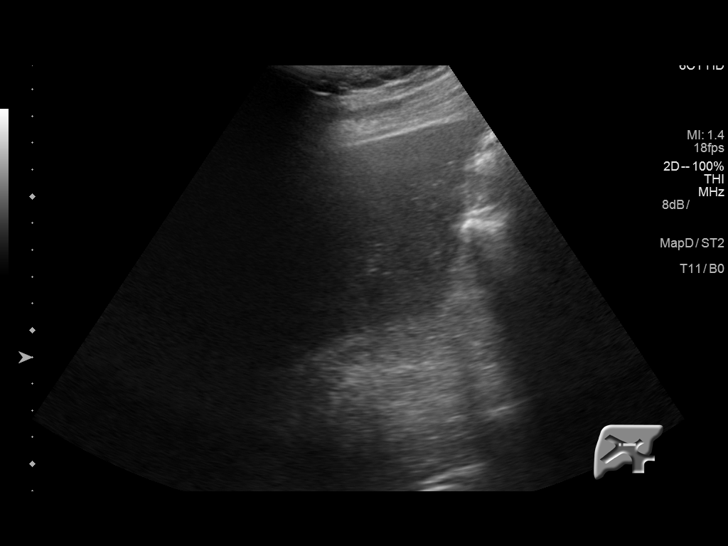
[im 63/125]
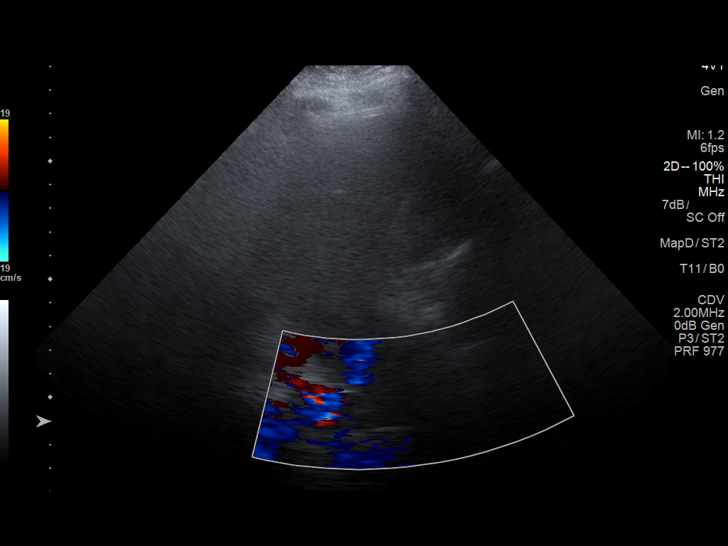
[im 73/125]
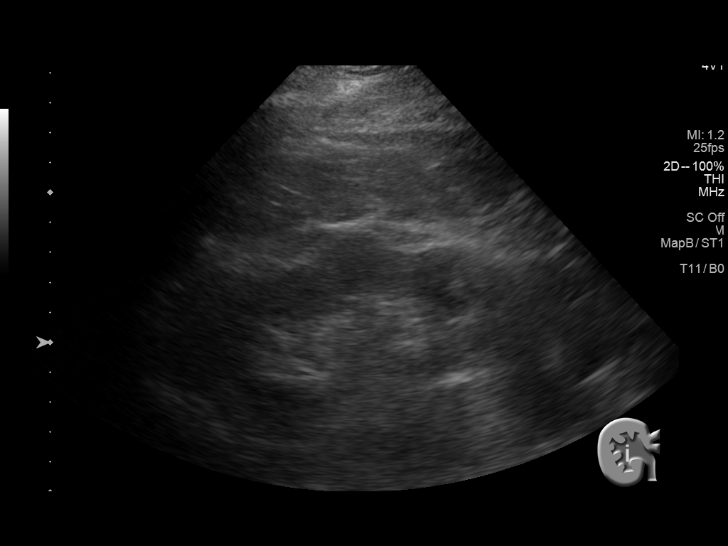
[im 83/125]
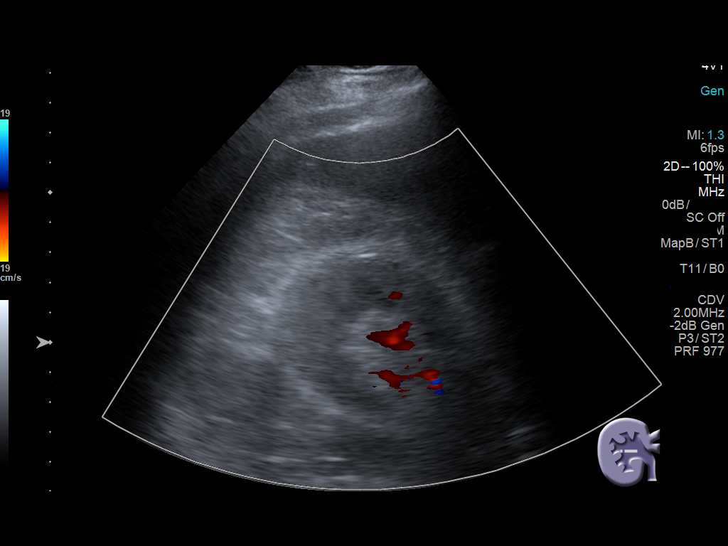
[im 94/125]
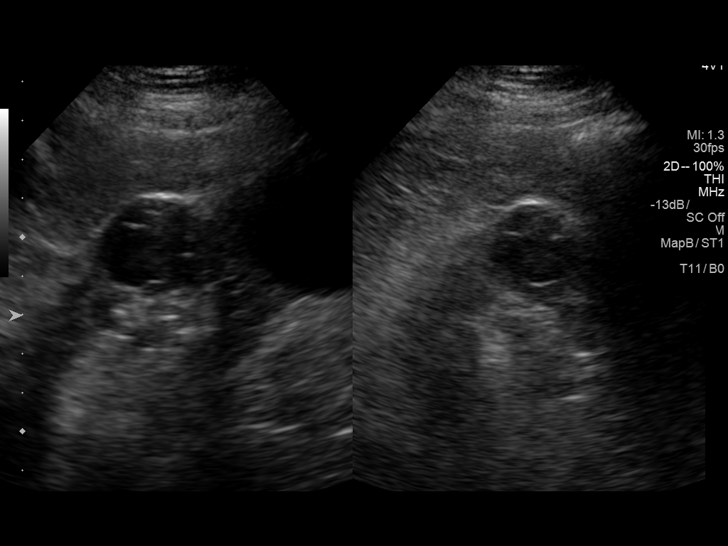
[im 104/125]
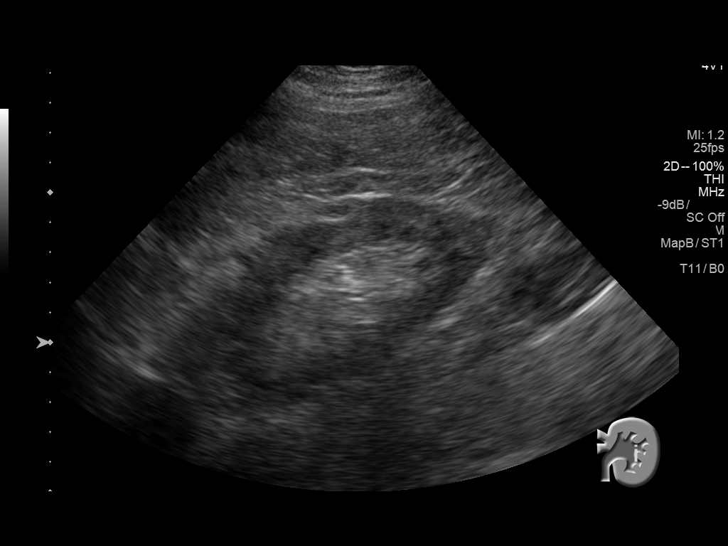
[im 114/125]
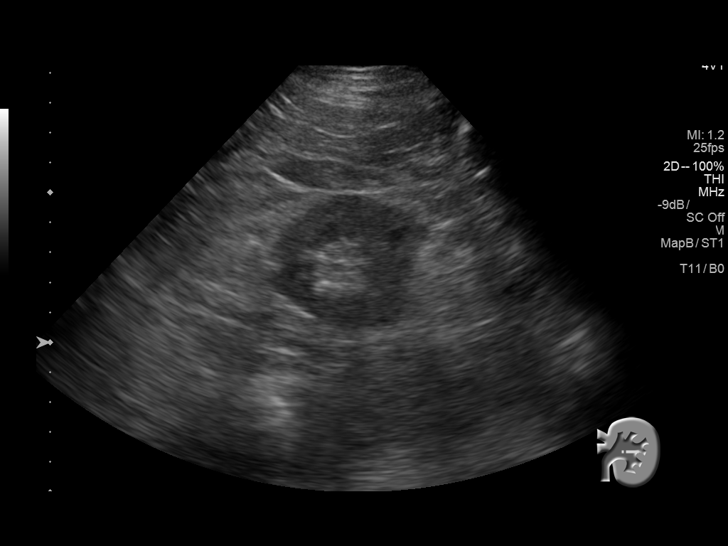
[im 125/125]
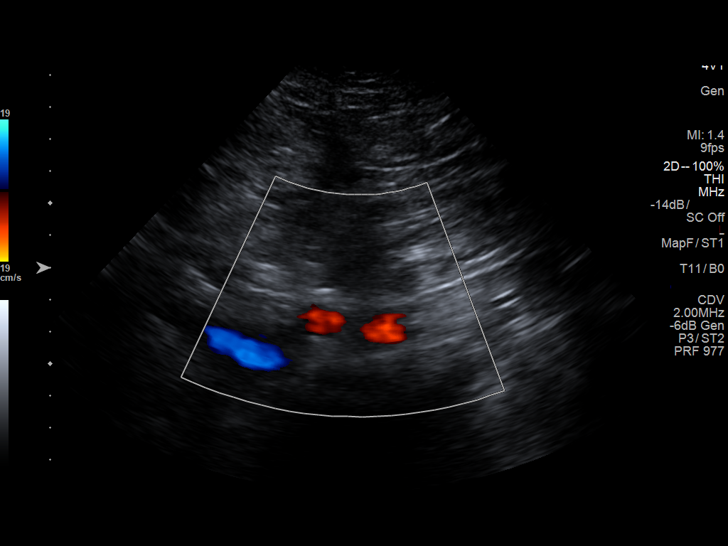

[13 of 25 positions shown; findings below may reference images not displayed]

FINDINGS: Bowel gas and the patient's body habitus limits the study.

Gallbladder: No gallstones or wall thickening visualized. No
sonographic Murphy sign noted by sonographer.

Common bile duct: Diameter: 1.7 mm

Liver: The hepatic echotexture is increased. The surface contour is
smooth. There is no focal mass nor ductal dilation. Portal vein is
patent on color Doppler imaging with normal direction of blood flow
towards the liver.

IVC: No abnormality visualized.

Pancreas: Bowel gas obscures the pancreas.

Spleen: Bowel gas limits evaluation of the spleen. As best as can be
determined it is not enlarged.

Right Kidney: Length: 12.9 cm. The cortical echotexture is
increased. There is a lower pole cystic structure with internal
echoes measuring 6 x 5.4 x 6.1 cm. There is an adjacent cystic
structure with more internal echogenicity measuring 3.4 x 2.7 x
cm

Left Kidney: Length: 12.3 cm. The cortical echotexture is increased.

Abdominal aorta: The mid aorta is obscured. No distal aortic
aneurysm is observed. Proximally the maximal diameter is 2.7 cm.

Other findings: None.
IMPRESSION: Somewhat limited study. No sonographic evidence of gallstones or
acute cholecystitis. Increased hepatic echotexture compatible with
fatty infiltrative change.

Limited visualization of the spleen. No definite splenomegaly is
observed.

Increased renal cortical echotexture compatible with medical renal
disease. Non simple appearing cystic structures in the lower pole of
the right kidney. Renal protocol MRI would be a useful next imaging
step to further evaluate the right kidney.

Limited visualization of the abdominal aorta.  No definite aneurysm.

## 2019-11-11 ENCOUNTER — Emergency Department (HOSPITAL_COMMUNITY)
Admission: EM | Admit: 2019-11-11 | Discharge: 2019-11-11 | Disposition: A | Discharge status: Home / Self Care | Payer: Medicare HMO | Attending: Emergency Medicine | Admitting: Emergency Medicine

## 2019-11-11 ENCOUNTER — Other Ambulatory Visit: Payer: Self-pay

## 2019-11-11 DIAGNOSIS — Z5321 Procedure and treatment not carried out due to patient leaving prior to being seen by health care provider: Secondary | ICD-10-CM | POA: Diagnosis not present

## 2019-11-11 DIAGNOSIS — M7918 Myalgia, other site: Secondary | ICD-10-CM | POA: Diagnosis present

## 2020-04-07 ENCOUNTER — Emergency Department (HOSPITAL_COMMUNITY): Payer: Medicare HMO

## 2020-04-07 ENCOUNTER — Inpatient Hospital Stay (HOSPITAL_COMMUNITY)
Admission: EM | Admit: 2020-04-07 | Discharge: 2020-04-10 | DRG: 247 | Disposition: A | Discharge status: Home / Self Care | Payer: Medicare HMO | Attending: Cardiovascular Disease | Admitting: Cardiovascular Disease

## 2020-04-07 ENCOUNTER — Encounter (HOSPITAL_COMMUNITY): Payer: Self-pay | Admitting: Emergency Medicine

## 2020-04-07 ENCOUNTER — Other Ambulatory Visit: Payer: Self-pay

## 2020-04-07 DIAGNOSIS — Z888 Allergy status to other drugs, medicaments and biological substances status: Secondary | ICD-10-CM

## 2020-04-07 DIAGNOSIS — E1169 Type 2 diabetes mellitus with other specified complication: Secondary | ICD-10-CM

## 2020-04-07 DIAGNOSIS — Z23 Encounter for immunization: Secondary | ICD-10-CM

## 2020-04-07 DIAGNOSIS — I152 Hypertension secondary to endocrine disorders: Secondary | ICD-10-CM | POA: Diagnosis present

## 2020-04-07 DIAGNOSIS — Z794 Long term (current) use of insulin: Secondary | ICD-10-CM

## 2020-04-07 DIAGNOSIS — K219 Gastro-esophageal reflux disease without esophagitis: Secondary | ICD-10-CM | POA: Diagnosis present

## 2020-04-07 DIAGNOSIS — I10 Essential (primary) hypertension: Secondary | ICD-10-CM | POA: Diagnosis present

## 2020-04-07 DIAGNOSIS — Z8249 Family history of ischemic heart disease and other diseases of the circulatory system: Secondary | ICD-10-CM

## 2020-04-07 DIAGNOSIS — E785 Hyperlipidemia, unspecified: Secondary | ICD-10-CM | POA: Diagnosis present

## 2020-04-07 DIAGNOSIS — E113599 Type 2 diabetes mellitus with proliferative diabetic retinopathy without macular edema, unspecified eye: Secondary | ICD-10-CM | POA: Diagnosis present

## 2020-04-07 DIAGNOSIS — Z9109 Other allergy status, other than to drugs and biological substances: Secondary | ICD-10-CM

## 2020-04-07 DIAGNOSIS — N179 Acute kidney failure, unspecified: Secondary | ICD-10-CM | POA: Diagnosis present

## 2020-04-07 DIAGNOSIS — Z833 Family history of diabetes mellitus: Secondary | ICD-10-CM

## 2020-04-07 DIAGNOSIS — R778 Other specified abnormalities of plasma proteins: Secondary | ICD-10-CM

## 2020-04-07 DIAGNOSIS — I5042 Chronic combined systolic (congestive) and diastolic (congestive) heart failure: Secondary | ICD-10-CM | POA: Diagnosis present

## 2020-04-07 DIAGNOSIS — I209 Angina pectoris, unspecified: Secondary | ICD-10-CM

## 2020-04-07 DIAGNOSIS — Z87891 Personal history of nicotine dependence: Secondary | ICD-10-CM

## 2020-04-07 DIAGNOSIS — Z79899 Other long term (current) drug therapy: Secondary | ICD-10-CM

## 2020-04-07 DIAGNOSIS — H409 Unspecified glaucoma: Secondary | ICD-10-CM | POA: Diagnosis present

## 2020-04-07 DIAGNOSIS — R7989 Other specified abnormal findings of blood chemistry: Secondary | ICD-10-CM

## 2020-04-07 DIAGNOSIS — R9431 Abnormal electrocardiogram [ECG] [EKG]: Secondary | ICD-10-CM

## 2020-04-07 DIAGNOSIS — N1832 Chronic kidney disease, stage 3b: Secondary | ICD-10-CM | POA: Diagnosis present

## 2020-04-07 DIAGNOSIS — I4510 Unspecified right bundle-branch block: Secondary | ICD-10-CM | POA: Diagnosis present

## 2020-04-07 DIAGNOSIS — E1151 Type 2 diabetes mellitus with diabetic peripheral angiopathy without gangrene: Secondary | ICD-10-CM | POA: Diagnosis present

## 2020-04-07 DIAGNOSIS — I2510 Atherosclerotic heart disease of native coronary artery without angina pectoris: Secondary | ICD-10-CM

## 2020-04-07 DIAGNOSIS — Z79891 Long term (current) use of opiate analgesic: Secondary | ICD-10-CM

## 2020-04-07 DIAGNOSIS — E1159 Type 2 diabetes mellitus with other circulatory complications: Secondary | ICD-10-CM | POA: Diagnosis present

## 2020-04-07 DIAGNOSIS — N183 Chronic kidney disease, stage 3 unspecified: Secondary | ICD-10-CM | POA: Diagnosis present

## 2020-04-07 DIAGNOSIS — Z7902 Long term (current) use of antithrombotics/antiplatelets: Secondary | ICD-10-CM

## 2020-04-07 DIAGNOSIS — E1122 Type 2 diabetes mellitus with diabetic chronic kidney disease: Secondary | ICD-10-CM | POA: Diagnosis present

## 2020-04-07 DIAGNOSIS — Z7982 Long term (current) use of aspirin: Secondary | ICD-10-CM

## 2020-04-07 DIAGNOSIS — Z7984 Long term (current) use of oral hypoglycemic drugs: Secondary | ICD-10-CM

## 2020-04-07 DIAGNOSIS — Z20822 Contact with and (suspected) exposure to covid-19: Secondary | ICD-10-CM | POA: Diagnosis present

## 2020-04-07 DIAGNOSIS — I77810 Thoracic aortic ectasia: Secondary | ICD-10-CM | POA: Diagnosis present

## 2020-04-07 DIAGNOSIS — I214 Non-ST elevation (NSTEMI) myocardial infarction: Principal | ICD-10-CM | POA: Diagnosis present

## 2020-04-07 DIAGNOSIS — I25810 Atherosclerosis of coronary artery bypass graft(s) without angina pectoris: Secondary | ICD-10-CM | POA: Diagnosis present

## 2020-04-07 DIAGNOSIS — I25110 Atherosclerotic heart disease of native coronary artery with unstable angina pectoris: Secondary | ICD-10-CM | POA: Diagnosis present

## 2020-04-07 DIAGNOSIS — I160 Hypertensive urgency: Secondary | ICD-10-CM | POA: Diagnosis present

## 2020-04-07 DIAGNOSIS — I255 Ischemic cardiomyopathy: Secondary | ICD-10-CM | POA: Diagnosis present

## 2020-04-07 DIAGNOSIS — I252 Old myocardial infarction: Secondary | ICD-10-CM

## 2020-04-07 DIAGNOSIS — E119 Type 2 diabetes mellitus without complications: Secondary | ICD-10-CM

## 2020-04-07 DIAGNOSIS — I130 Hypertensive heart and chronic kidney disease with heart failure and stage 1 through stage 4 chronic kidney disease, or unspecified chronic kidney disease: Secondary | ICD-10-CM | POA: Diagnosis present

## 2020-04-07 DIAGNOSIS — E860 Dehydration: Secondary | ICD-10-CM

## 2020-04-07 NOTE — ED Provider Notes (Signed)
[facility] EMERGENCY DEPARTMENT Provider Note   CSN: [id number] Arrival date & time: 04/07/20  2254     History Chief Complaint  Patient presents with   Shortness of Breath    Steven Osborne is a 80 y.o. male.  Patient presents to the emergency department for evaluation of shortness of breath and chest pain.  Patient reports that he has noticed for the last several weeks that he gets very short of breath when he exerts himself.  When this occurs he also gets pain in the center of his chest.  Symptoms seem to resolve when he sits down and rests.  He is not currently having pain.        Past Medical History:  Diagnosis Date   ARTERIOSCLEROSIS 02/10/2010   CAD 02/25/2010   DIABETES TYPE 2 02/10/2010   Glaucoma    right eye   HYPERLIPIDEMIA NOS 02/10/2010   HYPERTENSION 02/10/2010   INTERMITTENT CLAUDICATION 02/10/2010   MI, old     Patient Active Problem List   Diagnosis Date Noted   Acute upper GI bleed    GI bleed 12/17/2017   CKD stage 3 due to type 2 diabetes mellitus (HCC) 09/25/2017   Dehydration 09/24/2017   Ischemic cardiomyopathy 09/24/2017   Chronic systolic CHF (congestive heart failure) (HCC) 09/24/2017   Type 2 diabetes mellitus with proliferative retinopathy (HCC) 09/24/2017   Thrombocytopenia (HCC) 09/24/2017   Hypotension    Near syncope    Diabetes (HCC) 06/09/2015   Coronary atherosclerosis 02/25/2010   HYPERLIPIDEMIA NOS 02/10/2010   Essential hypertension 02/10/2010   ARTERIOSCLEROSIS 02/10/2010   INTERMITTENT CLAUDICATION 02/10/2010    Past Surgical History:  Procedure Laterality Date   BIOPSY  12/18/2017   Procedure: BIOPSY;  Surgeon: West Bali, MD;  Location: [facility];  Service: Endoscopy;;   CORONARY ARTERY BYPASS GRAFT  2005   4 vessel bypass    ESOPHAGOGASTRODUODENOSCOPY N/A 12/18/2017   Procedure: ESOPHAGOGASTRODUODENOSCOPY (EGD);  Surgeon: West Bali, MD;  Location: [facility];  Service:  Endoscopy;  Laterality: N/A;       Family History  Problem Relation Age of Onset   Diabetes Mother    Heart disease Mother    Diabetes Father    Heart disease Father    Stroke Father    Diabetes Sister    Diabetes Brother    Hypertension Other    Colon cancer Neg Hx    Colon polyps Neg Hx     Social History   Tobacco Use   Smoking status: Former Smoker    Types: Cigars   Smokeless tobacco: Former Neurosurgeon    Types: Chew    Quit date: 02/21/2014  Vaping Use   Vaping Use: Never used  Substance Use Topics   Alcohol use: No    Comment: as a teenager to late 20s, none since    Drug use: No    Home Medications Prior to Admission medications   Medication Sig Start Date End Date Taking? Authorizing Provider  amoxicillin (AMOXIL) 500 MG tablet 2 PO BID FOR 10 DAYS Patient not taking: Reported on 03/02/2018 12/25/17   West Bali, MD  aspirin EC 81 MG tablet Take 81 mg by mouth daily.    [provider]  atorvastatin (LIPITOR) 40 MG tablet Take 1 tablet (40 mg total) by mouth daily at 6 PM. Patient not taking: Reported on 03/02/2018 09/26/17   Catarina Hartshorn, MD  carvedilol (COREG) 6.25 MG tablet Take 1 tablet (6.25 mg  total) by mouth 2 (two) times daily with a meal. 09/26/17   Tat, Onalee Hua, MD  clarithromycin (BIAXIN) 500 MG tablet 1 PO BID FOR 10 DAYS. DO NOT TAKE LIPITOR WHILE TAKING BIAXIN. 12/25/17   Fields, Darleene Cleaver, MD  diclofenac (FLECTOR) 1.3 % PTCH Place 1 patch onto the skin 2 (two) times daily. 03/02/18   Gerhard Munch, MD  hydrALAZINE (APRESOLINE) 25 MG tablet Take 1 tablet (25 mg total) by mouth every 8 (eight) hours. 09/26/17   Catarina Hartshorn, MD  HYDROcodone-acetaminophen (NORCO/VICODIN) 5-325 MG tablet Take 1 tablet by mouth every 8 (eight) hours as needed for moderate pain.    [provider]  insulin NPH Human (NOVOLIN N) 100 UNIT/ML injection Inject 0.2 mLs (20 Units total) into the skin 2 (two) times daily before a meal. 09/26/17   Tat, Onalee Hua, MD   isosorbide mononitrate (IMDUR) 30 MG 24 hr tablet Take 30 mg by mouth daily.  12/11/15   [provider]  LORazepam (ATIVAN) 0.5 MG tablet Take 0.5 mg by mouth 2 (two) times daily.    [provider]  Omega-3 Fatty Acids (FISH OIL PO) Take 1 capsule by mouth daily.    [provider]  pantoprazole (PROTONIX) 40 MG tablet Take 1 tablet (40 mg total) by mouth 2 (two) times daily before a meal. 12/19/17   Erick Blinks, MD  timolol (TIMOPTIC) 0.25 % ophthalmic solution Place 1 drop into the right eye 2 (two) times daily.  08/01/11   [provider]  traMADol (ULTRAM) 50 MG tablet Take 1 tablet (50 mg total) by mouth every 6 (six) hours as needed. 03/02/18   Gerhard Munch, MD  vitamin C (ASCORBIC ACID) 500 MG tablet Take 500 mg by mouth daily.    [provider]  VITAMIN E PO Take 1 tablet by mouth daily.    [provider]    Allergies    Crestor [rosuvastatin calcium]  Review of Systems   Review of Systems  Respiratory: Positive for shortness of breath.   Cardiovascular: Positive for chest pain.  All other systems reviewed and are negative.   Physical Exam Updated Vital Signs BP (!) 161/95    Pulse 68    Temp (!) 97.4 F (36.3 C) (Oral)    Resp 17    Ht 5\' 10"  (1.778 m)    Wt 93.4 kg    SpO2 93%    BMI 29.56 kg/m   Physical Exam Vitals and nursing note reviewed.  Constitutional:      General: He is not in acute distress.    Appearance: Normal appearance. He is well-developed and well-nourished.  HENT:     Head: Normocephalic and atraumatic.     Right Ear: Hearing normal.     Left Ear: Hearing normal.     Nose: Nose normal.     Mouth/Throat:     Mouth: Oropharynx is clear and moist and mucous membranes are normal.  Eyes:     Extraocular Movements: EOM normal.     Conjunctiva/sclera: Conjunctivae normal.     Pupils: Pupils are equal, round, and reactive to light.  Cardiovascular:     Rate and Rhythm: Regular rhythm.      Heart sounds: S1 normal and S2 normal. No murmur heard. No friction rub. No gallop.   Pulmonary:     Effort: Pulmonary effort is normal. No respiratory distress.     Breath sounds: Normal breath sounds.  Chest:     Chest wall: No tenderness.  Abdominal:     General: Bowel sounds are normal.     Palpations: Abdomen is soft. There is no hepatosplenomegaly.     Tenderness: There is no abdominal tenderness. There is no guarding or rebound. Negative signs include Murphy's sign and McBurney's sign.     Hernia: No hernia is present.  Musculoskeletal:        General: Normal range of motion.     Cervical back: Normal range of motion and neck supple.  Skin:    General: Skin is warm, dry and intact.     Findings: No rash.     Nails: There is no cyanosis.  Neurological:     Mental Status: He is alert and oriented to person, place, and time.     GCS: GCS eye subscore is 4. GCS verbal subscore is 5. GCS motor subscore is 6.     Cranial Nerves: No cranial nerve deficit.     Sensory: No sensory deficit.     Coordination: Coordination normal.     Deep Tendon Reflexes: Strength normal.  Psychiatric:        Mood and Affect: Mood and affect normal.        Speech: Speech normal.        Behavior: Behavior normal.        Thought Content: Thought content normal.     ED Results / Procedures / Treatments   Labs (all labs ordered are listed, but only abnormal results are displayed) Labs Reviewed  CBC WITH DIFFERENTIAL/PLATELET - Abnormal; Notable for the following components:      Result Value   Eosinophils Absolute 0.7 (*)    All other components within normal limits  BASIC METABOLIC PANEL - Abnormal; Notable for the following components:   Glucose, Bld 100 (*)    BUN 27 (*)    Creatinine, Ser 1.79 (*)    Calcium 8.8 (*)    GFR, Estimated 38 (*)    All other components within normal limits  BRAIN NATRIURETIC PEPTIDE - Abnormal; Notable for the following components:   B Natriuretic Peptide  265.0 (*)    All other components within normal limits  TROPONIN I (HIGH SENSITIVITY) - Abnormal; Notable for the following components:   Troponin I (High Sensitivity) 288 (*)    All other components within normal limits  RESP PANEL BY RT-PCR (FLU A&B, COVID) ARPGX2  HEPARIN LEVEL (UNFRACTIONATED)  TROPONIN I (HIGH SENSITIVITY)    EKG None ED ECG REPORT   Date: 04/07/2020  Rate: 79  Rhythm: normal sinus rhythm  QRS Axis: normal  Intervals: normal  ST/T Wave abnormalities: normal  Conduction Disutrbances:right bundle branch block  Narrative Interpretation:   Old EKG Reviewed: unchanged  I have personally reviewed the EKG tracing and agree with the computerized printout as noted.  Radiology DG Chest Portable 1 View  Result Date: 04/08/2020 CLINICAL DATA:  Shortness of breath and chest pain. EXAM: PORTABLE CHEST 1 VIEW COMPARISON:  September 24, 2017 FINDINGS: Multiple sternal wires and vascular clips are seen. Diffuse chronic appearing increased interstitial lung markings are noted. There is no evidence of acute infiltrate, pleural effusion or pneumothorax. There is moderate severity enlargement of the cardiac silhouette. The visualized skeletal structures are unremarkable. IMPRESSION: Stable cardiomegaly with evidence of prior median sternotomy/CABG. Electronically Signed   By: Aram Candelahaddeus  Houston M.D.   On: 04/08/2020 00:18    Procedures Procedures   Medications Ordered in ED Medications  nitroGLYCERIN 50 mg in dextrose 5 % 250 mL (0.2 mg/mL)  infusion (5 mcg/min Intravenous New Bag/Given 04/08/20 0115)  heparin ADULT infusion 100 units/mL (25000 units/258mL) (1,100 Units/hr Intravenous New Bag/Given 04/08/20 0144)  heparin bolus via infusion 4,000 Units (4,000 Units Intravenous Bolus from Bag 04/08/20 0144)    ED Course  I have reviewed the triage vital signs and the nursing notes.  Pertinent labs & imaging results that were available during my care of the patient were reviewed by  me and considered in my medical decision making (see chart for details).    MDM Rules/Calculators/A&P                          Patient presents to the emergency department with progressive dyspnea on exertion and exertional chest pain over a period of several weeks.  Patient with extensive coronary history.  From his last visit to his cardiologist at [facility]:  He had catheterization on 01/07/15 which showed that the LIMA graft to the LAD was patent although there was 40-50% distal  LAD disease. His other grafts were occluded. He had severe triple vessel disease that was felt to have fair collateralization and medical management was chosen.  Echocardiogram 05/14/2018 showed mild concentric left ventricular hypertrophy with anterolateral wall hypokinesis and moderate reduction in ejection fraction of 40 to 45%. Grade 2 diastolic dysfunction. Right ventricular systolic pressure not available   EKG today with known right bundle branch block but no obvious ischemia no ST elevations.  First troponin elevated.  Patient's BUN and creatinine are also elevated, GFR 38.  Symptoms concerning for acute coronary syndrome.  Initiated on nitro as patient was fairly hypertensive at arrival as well as heparin.  Discussed with Dr. Daphine Deutscher, on-call for cardiology at [facility].  As patient is a poor candidate for cardiac catheterization at this time and has been doing medical management in the past, agrees with current treatment plan and patient does not require urgent transfer to [facility].  Admit here at [facility] and have cardiology see patient in morning.  CRITICAL CARE Performed by: Gilda Crease   Total critical care time: 35 minutes  Critical care time was exclusive of separately billable procedures and treating other patients.  Critical care was necessary to treat or prevent imminent or life-threatening deterioration.  Critical care was time spent personally by me on the following activities:  development of treatment plan with patient and/or surrogate as well as nursing, discussions with consultants, evaluation of patient's response to treatment, examination of patient, obtaining history from patient or surrogate, ordering and performing treatments and interventions, ordering and review of laboratory studies, ordering and review of radiographic studies, pulse oximetry and re-evaluation of patient's condition.  Final Clinical Impression(s) / ED Diagnoses Final diagnoses:  NSTEMI (non-ST elevated myocardial infarction) [facility])    Rx / DC Orders ED Discharge Orders    None       Jorryn Casagrande, Canary Brim, MD 04/08/20 [phone]

## 2020-04-07 NOTE — ED Triage Notes (Signed)
Pt states he has been "short-winded" with exertion and has mid-sternal chest pain when he "takes a deep breath".

## 2020-04-08 ENCOUNTER — Other Ambulatory Visit (HOSPITAL_COMMUNITY): Payer: Medicare HMO

## 2020-04-08 DIAGNOSIS — Z833 Family history of diabetes mellitus: Secondary | ICD-10-CM | POA: Diagnosis not present

## 2020-04-08 DIAGNOSIS — I4510 Unspecified right bundle-branch block: Secondary | ICD-10-CM | POA: Diagnosis present

## 2020-04-08 DIAGNOSIS — K219 Gastro-esophageal reflux disease without esophagitis: Secondary | ICD-10-CM | POA: Diagnosis present

## 2020-04-08 DIAGNOSIS — Z87891 Personal history of nicotine dependence: Secondary | ICD-10-CM | POA: Diagnosis not present

## 2020-04-08 DIAGNOSIS — N179 Acute kidney failure, unspecified: Secondary | ICD-10-CM

## 2020-04-08 DIAGNOSIS — R778 Other specified abnormalities of plasma proteins: Secondary | ICD-10-CM

## 2020-04-08 DIAGNOSIS — I77810 Thoracic aortic ectasia: Secondary | ICD-10-CM | POA: Diagnosis present

## 2020-04-08 DIAGNOSIS — I25110 Atherosclerotic heart disease of native coronary artery with unstable angina pectoris: Secondary | ICD-10-CM | POA: Diagnosis present

## 2020-04-08 DIAGNOSIS — E1151 Type 2 diabetes mellitus with diabetic peripheral angiopathy without gangrene: Secondary | ICD-10-CM | POA: Diagnosis present

## 2020-04-08 DIAGNOSIS — I130 Hypertensive heart and chronic kidney disease with heart failure and stage 1 through stage 4 chronic kidney disease, or unspecified chronic kidney disease: Secondary | ICD-10-CM | POA: Diagnosis present

## 2020-04-08 DIAGNOSIS — H409 Unspecified glaucoma: Secondary | ICD-10-CM | POA: Diagnosis present

## 2020-04-08 DIAGNOSIS — E785 Hyperlipidemia, unspecified: Secondary | ICD-10-CM | POA: Diagnosis present

## 2020-04-08 DIAGNOSIS — I160 Hypertensive urgency: Secondary | ICD-10-CM | POA: Diagnosis present

## 2020-04-08 DIAGNOSIS — N183 Chronic kidney disease, stage 3 unspecified: Secondary | ICD-10-CM

## 2020-04-08 DIAGNOSIS — R9431 Abnormal electrocardiogram [ECG] [EKG]: Secondary | ICD-10-CM

## 2020-04-08 DIAGNOSIS — I25810 Atherosclerosis of coronary artery bypass graft(s) without angina pectoris: Secondary | ICD-10-CM | POA: Diagnosis present

## 2020-04-08 DIAGNOSIS — E1122 Type 2 diabetes mellitus with diabetic chronic kidney disease: Secondary | ICD-10-CM

## 2020-04-08 DIAGNOSIS — Z7982 Long term (current) use of aspirin: Secondary | ICD-10-CM | POA: Diagnosis not present

## 2020-04-08 DIAGNOSIS — Z20822 Contact with and (suspected) exposure to covid-19: Secondary | ICD-10-CM | POA: Diagnosis present

## 2020-04-08 DIAGNOSIS — I252 Old myocardial infarction: Secondary | ICD-10-CM | POA: Diagnosis not present

## 2020-04-08 DIAGNOSIS — N1832 Chronic kidney disease, stage 3b: Secondary | ICD-10-CM | POA: Diagnosis present

## 2020-04-08 DIAGNOSIS — E782 Mixed hyperlipidemia: Secondary | ICD-10-CM | POA: Diagnosis not present

## 2020-04-08 DIAGNOSIS — R7989 Other specified abnormal findings of blood chemistry: Secondary | ICD-10-CM

## 2020-04-08 DIAGNOSIS — I209 Angina pectoris, unspecified: Secondary | ICD-10-CM | POA: Diagnosis not present

## 2020-04-08 DIAGNOSIS — I10 Essential (primary) hypertension: Secondary | ICD-10-CM | POA: Diagnosis not present

## 2020-04-08 DIAGNOSIS — Z23 Encounter for immunization: Secondary | ICD-10-CM | POA: Diagnosis present

## 2020-04-08 DIAGNOSIS — R079 Chest pain, unspecified: Secondary | ICD-10-CM | POA: Diagnosis not present

## 2020-04-08 DIAGNOSIS — Z8249 Family history of ischemic heart disease and other diseases of the circulatory system: Secondary | ICD-10-CM | POA: Diagnosis not present

## 2020-04-08 DIAGNOSIS — I2510 Atherosclerotic heart disease of native coronary artery without angina pectoris: Secondary | ICD-10-CM | POA: Diagnosis not present

## 2020-04-08 DIAGNOSIS — I5042 Chronic combined systolic (congestive) and diastolic (congestive) heart failure: Secondary | ICD-10-CM | POA: Diagnosis present

## 2020-04-08 DIAGNOSIS — I214 Non-ST elevation (NSTEMI) myocardial infarction: Secondary | ICD-10-CM | POA: Diagnosis present

## 2020-04-08 DIAGNOSIS — I255 Ischemic cardiomyopathy: Secondary | ICD-10-CM | POA: Diagnosis present

## 2020-04-08 DIAGNOSIS — E113599 Type 2 diabetes mellitus with proliferative diabetic retinopathy without macular edema, unspecified eye: Secondary | ICD-10-CM | POA: Diagnosis present

## 2020-04-08 LAB — CBC WITH DIFFERENTIAL/PLATELET
Abs Immature Granulocytes: 0.02 10*3/uL (ref 0.00–0.07)
Basophils Absolute: 0.1 10*3/uL (ref 0.0–0.1)
Basophils Relative: 1 %
Eosinophils Absolute: 0.7 10*3/uL — ABNORMAL HIGH (ref 0.0–0.5)
Eosinophils Relative: 9 %
HCT: 41.9 % (ref 39.0–52.0)
Hemoglobin: 14.3 g/dL (ref 13.0–17.0)
Immature Granulocytes: 0 %
Lymphocytes Relative: 22 %
Lymphs Abs: 1.6 10*3/uL (ref 0.7–4.0)
MCH: 29.5 pg (ref 26.0–34.0)
MCHC: 34.1 g/dL (ref 30.0–36.0)
MCV: 86.4 fL (ref 80.0–100.0)
Monocytes Absolute: 0.7 10*3/uL (ref 0.1–1.0)
Monocytes Relative: 10 %
Neutro Abs: 4.3 10*3/uL (ref 1.7–7.7)
Neutrophils Relative %: 58 %
Platelets: 159 10*3/uL (ref 150–400)
RBC: 4.85 MIL/uL (ref 4.22–5.81)
RDW: 13.8 % (ref 11.5–15.5)
WBC: 7.4 10*3/uL (ref 4.0–10.5)
nRBC: 0 % (ref 0.0–0.2)

## 2020-04-08 LAB — TROPONIN I (HIGH SENSITIVITY)
Troponin I (High Sensitivity): 286 ng/L (ref <18)
Troponin I (High Sensitivity): 288 ng/L (ref <18)

## 2020-04-08 LAB — BASIC METABOLIC PANEL
Anion gap: 10 (ref 5–15)
Anion gap: 7 (ref 5–15)
BUN: 25 mg/dL — ABNORMAL HIGH (ref 8–23)
BUN: 27 mg/dL — ABNORMAL HIGH (ref 8–23)
CO2: 22 mmol/L (ref 22–32)
CO2: 23 mmol/L (ref 22–32)
Calcium: 8.8 mg/dL — ABNORMAL LOW (ref 8.9–10.3)
Calcium: 8.9 mg/dL (ref 8.9–10.3)
Chloride: 104 mmol/L (ref 98–111)
Chloride: 106 mmol/L (ref 98–111)
Creatinine, Ser: 1.42 mg/dL — ABNORMAL HIGH (ref 0.61–1.24)
Creatinine, Ser: 1.79 mg/dL — ABNORMAL HIGH (ref 0.61–1.24)
GFR, Estimated: 38 mL/min — ABNORMAL LOW (ref >60)
GFR, Estimated: 51 mL/min — ABNORMAL LOW (ref >60)
Glucose, Bld: 100 mg/dL — ABNORMAL HIGH (ref 70–99)
Glucose, Bld: 143 mg/dL — ABNORMAL HIGH (ref 70–99)
Potassium: 3.7 mmol/L (ref 3.5–5.1)
Potassium: 3.8 mmol/L (ref 3.5–5.1)
Sodium: 136 mmol/L (ref 135–145)
Sodium: 136 mmol/L (ref 135–145)

## 2020-04-08 LAB — GLUCOSE, CAPILLARY
Glucose-Capillary: 212 mg/dL — ABNORMAL HIGH (ref 70–99)
Glucose-Capillary: 197 mg/dL — ABNORMAL HIGH (ref 70–99)

## 2020-04-08 LAB — RESP PANEL BY RT-PCR (FLU A&B, COVID) ARPGX2
Influenza A by PCR: NEGATIVE
Influenza B by PCR: NEGATIVE
SARS Coronavirus 2 by RT PCR: NEGATIVE

## 2020-04-08 LAB — PHOSPHORUS: Phosphorus: 2.4 mg/dL — ABNORMAL LOW (ref 2.5–4.6)

## 2020-04-08 LAB — HEPARIN LEVEL (UNFRACTIONATED)
Heparin Unfractionated: 0.32 IU/mL (ref 0.30–0.70)
Heparin Unfractionated: 0.19 IU/mL — ABNORMAL LOW (ref 0.30–0.70)

## 2020-04-08 LAB — CBG MONITORING, ED
Glucose-Capillary: 116 mg/dL — ABNORMAL HIGH (ref 70–99)
Glucose-Capillary: 145 mg/dL — ABNORMAL HIGH (ref 70–99)

## 2020-04-08 LAB — MAGNESIUM: Magnesium: 1.8 mg/dL (ref 1.7–2.4)

## 2020-04-08 LAB — BRAIN NATRIURETIC PEPTIDE: B Natriuretic Peptide: 265 pg/mL — ABNORMAL HIGH (ref 0.0–100.0)

## 2020-04-08 MED ORDER — ISOSORBIDE DINITRATE 20 MG PO TABS
20.0000 mg | ORAL_TABLET | Freq: Once | ORAL | Status: AC
  Administered 2020-04-08: 20 mg via ORAL
  Filled 2020-04-08: qty 1

## 2020-04-08 MED ORDER — SODIUM CHLORIDE 0.9% FLUSH
3.0000 mL | Freq: Two times a day (BID) | INTRAVENOUS | Status: DC
  Administered 2020-04-09: 3 mL via INTRAVENOUS

## 2020-04-08 MED ORDER — CARVEDILOL 25 MG PO TABS
25.0000 mg | ORAL_TABLET | Freq: Two times a day (BID) | ORAL | Status: DC
  Administered 2020-04-08 – 2020-04-10 (×5): 25 mg via ORAL
  Filled 2020-04-08: qty 8
  Filled 2020-04-08 (×4): qty 1

## 2020-04-08 MED ORDER — ISOSORBIDE MONONITRATE ER 60 MG PO TB24
60.0000 mg | ORAL_TABLET | Freq: Every day | ORAL | Status: DC
  Administered 2020-04-09: 60 mg via ORAL
  Filled 2020-04-08 (×2): qty 1

## 2020-04-08 MED ORDER — TIMOLOL MALEATE 0.25 % OP SOLN
1.0000 [drp] | Freq: Two times a day (BID) | OPHTHALMIC | Status: DC
  Administered 2020-04-08 – 2020-04-09 (×3): 1 [drp] via OPHTHALMIC
  Filled 2020-04-08 (×2): qty 5

## 2020-04-08 MED ORDER — INSULIN ASPART 100 UNIT/ML ~~LOC~~ SOLN
0.0000 [IU] | Freq: Every day | SUBCUTANEOUS | Status: DC
  Administered 2020-04-09: 2 [IU] via SUBCUTANEOUS

## 2020-04-08 MED ORDER — NITROGLYCERIN IN D5W 200-5 MCG/ML-% IV SOLN
5.0000 ug/min | INTRAVENOUS | Status: DC
  Administered 2020-04-08: 5 ug/min via INTRAVENOUS
  Filled 2020-04-08: qty 250

## 2020-04-08 MED ORDER — HEPARIN (PORCINE) 25000 UT/250ML-% IV SOLN
1350.0000 [IU]/h | INTRAVENOUS | Status: DC
  Administered 2020-04-08: 1100 [IU]/h via INTRAVENOUS
  Administered 2020-04-09: 1350 [IU]/h via INTRAVENOUS
  Filled 2020-04-08 (×3): qty 250

## 2020-04-08 MED ORDER — SODIUM CHLORIDE 0.9 % IV SOLN: 250.0000 mL | INTRAVENOUS | Status: DC | PRN

## 2020-04-08 MED ORDER — ATORVASTATIN CALCIUM 10 MG PO TABS
20.0000 mg | ORAL_TABLET | Freq: Every day | ORAL | Status: DC
  Administered 2020-04-08 – 2020-04-09 (×2): 20 mg via ORAL
  Filled 2020-04-08 (×2): qty 2

## 2020-04-08 MED ORDER — ISOSORBIDE MONONITRATE ER 30 MG PO TB24
30.0000 mg | ORAL_TABLET | Freq: Every day | ORAL | Status: DC
  Administered 2020-04-08: 30 mg via ORAL
  Filled 2020-04-08: qty 1

## 2020-04-08 MED ORDER — INFLUENZA VAC A&B SA ADJ QUAD 0.5 ML IM PRSY
0.5000 mL | PREFILLED_SYRINGE | INTRAMUSCULAR | Status: AC
  Administered 2020-04-10: 0.5 mL via INTRAMUSCULAR
  Filled 2020-04-08: qty 0.5

## 2020-04-08 MED ORDER — CLOPIDOGREL BISULFATE 75 MG PO TABS
75.0000 mg | ORAL_TABLET | Freq: Every day | ORAL | Status: DC
  Administered 2020-04-08 – 2020-04-10 (×3): 75 mg via ORAL
  Filled 2020-04-08 (×4): qty 1

## 2020-04-08 MED ORDER — PANTOPRAZOLE SODIUM 40 MG PO TBEC
40.0000 mg | DELAYED_RELEASE_TABLET | Freq: Two times a day (BID) | ORAL | Status: DC
  Administered 2020-04-08 – 2020-04-10 (×5): 40 mg via ORAL
  Filled 2020-04-08 (×5): qty 1

## 2020-04-08 MED ORDER — ASPIRIN EC 81 MG PO TBEC
81.0000 mg | DELAYED_RELEASE_TABLET | Freq: Every day | ORAL | Status: DC
  Administered 2020-04-08 – 2020-04-10 (×3): 81 mg via ORAL
  Filled 2020-04-08 (×3): qty 1

## 2020-04-08 MED ORDER — OMEGA-3-ACID ETHYL ESTERS 1 G PO CAPS
1.0000 g | ORAL_CAPSULE | Freq: Every day | ORAL | Status: DC
  Administered 2020-04-08 – 2020-04-10 (×3): 1 g via ORAL
  Filled 2020-04-08 (×3): qty 1

## 2020-04-08 MED ORDER — HEPARIN BOLUS VIA INFUSION
4000.0000 [IU] | Freq: Once | INTRAVENOUS | Status: AC
  Administered 2020-04-08: 4000 [IU] via INTRAVENOUS

## 2020-04-08 MED ORDER — HEPARIN BOLUS VIA INFUSION
2500.0000 [IU] | Freq: Once | INTRAVENOUS | Status: AC
  Administered 2020-04-08: 2500 [IU] via INTRAVENOUS
  Filled 2020-04-08: qty 2500

## 2020-04-08 MED ORDER — ASPIRIN 81 MG PO CHEW
81.0000 mg | CHEWABLE_TABLET | ORAL | Status: AC
  Administered 2020-04-09: 81 mg via ORAL
  Filled 2020-04-08: qty 1

## 2020-04-08 MED ORDER — HYDRALAZINE HCL 25 MG PO TABS
25.0000 mg | ORAL_TABLET | Freq: Three times a day (TID) | ORAL | Status: DC
  Administered 2020-04-08 (×2): 25 mg via ORAL
  Filled 2020-04-08 (×3): qty 1

## 2020-04-08 MED ORDER — HYDRALAZINE HCL 50 MG PO TABS
50.0000 mg | ORAL_TABLET | Freq: Three times a day (TID) | ORAL | Status: DC
  Administered 2020-04-08 – 2020-04-10 (×4): 50 mg via ORAL
  Filled 2020-04-08 (×4): qty 1

## 2020-04-08 MED ORDER — INSULIN ASPART 100 UNIT/ML ~~LOC~~ SOLN
0.0000 [IU] | Freq: Three times a day (TID) | SUBCUTANEOUS | Status: DC
  Administered 2020-04-08: 13:00:00 1 [IU] via SUBCUTANEOUS
  Administered 2020-04-08 – 2020-04-09 (×2): 3 [IU] via SUBCUTANEOUS
  Administered 2020-04-09 – 2020-04-10 (×2): 5 [IU] via SUBCUTANEOUS
  Administered 2020-04-10: 7 [IU] via SUBCUTANEOUS
  Filled 2020-04-08: qty 1

## 2020-04-08 MED ORDER — HYDRALAZINE HCL 20 MG/ML IJ SOLN
10.0000 mg | Freq: Four times a day (QID) | INTRAMUSCULAR | Status: DC | PRN
  Administered 2020-04-08: 20:00:00 10 mg via INTRAVENOUS
  Filled 2020-04-08: qty 1

## 2020-04-08 MED ORDER — SODIUM CHLORIDE 0.9% FLUSH: 3.0000 mL | INTRAVENOUS | Status: DC | PRN

## 2020-04-08 MED ORDER — SODIUM CHLORIDE 0.9% FLUSH
3.0000 mL | Freq: Two times a day (BID) | INTRAVENOUS | Status: DC
  Administered 2020-04-08 – 2020-04-10 (×2): 3 mL via INTRAVENOUS

## 2020-04-08 MED ORDER — SODIUM CHLORIDE 0.9 % IV SOLN: INTRAVENOUS | Status: DC

## 2020-04-08 MED ORDER — SODIUM CHLORIDE 0.9 % IV SOLN: INTRAVENOUS | Status: DC | PRN

## 2020-04-08 NOTE — Progress Notes (Signed)
RN called that patient was not responding to IV nitroglycerin dose started in the ED, BP was stable at 192/103.  IV nitroglycerin drip was stopped since patient's chest pain has since resolved.  IV hydralazine as needed started.  Will consider placing the patient on IV nicardipine drip if he does not respond to hydralazine.

## 2020-04-08 NOTE — Progress Notes (Signed)
Patient seen and evaluated, chart reviewed, please see EMR for updated orders. Please see full H&P dictated by admitting physician Dr. Thomes Dinning for same date of service.   - Patient is currently chest pain-free -Cardiology PA at bedside at the time of my visit to the patient's room - I am informed by cardiology team the patient will be transferred to cardiology service on 04/08/2020 with plans for [facility]  --  Hospitalist service will sign off as of 04/08/2020  --Please Recall hospitalist service if need arises  --Further management per cardiology service  - Patient seen and evaluated, chart reviewed, please see EMR for updated orders. Please see full H&P dictated by admitting physician Dr. Thomes Dinning for same date of service.   Steven Hale, MD

## 2020-04-08 NOTE — ED Notes (Signed)
Attending MD aware the nitro is not bringing the BP down.

## 2020-04-08 NOTE — H&P (Addendum)
History and Physical  Steven HailJohn Osborne WUJ:[id number] DOB: 12-12-1940 DOA: 04/07/2020  Referring physician: Gilda CreasePollina, Christopher J, MD PCP: Jason CoopNicholson, Sterling J IV, FNP  Patient coming from: Home  Chief Complaint: Shortness of breath  HPI: Steven Osborne is a 80 y.o. male with medical history significant hypertension, hyperlipidemia, CAD S/P CABG x4, T2DM, GERD who presents to the emergency department due to shortness of breath and chest pain.  Patient complained of 2-3 weeks history of shortness of breath on exertion which is associated with midsternal chest pain. Chest pain and shortness of breath resolves within few minutes of rest.  Symptoms continue to recur whenever he exerts himself, so he decided to go to the ED for further evaluation and management.  He denies fever, chills, headache, nausea, vomiting or abdominal pain.  ED Course:  In the emergency department, BP was 145/80 and this subsequently increased to 189/103 while in the ED.  Work-up in the ED showed normal CBC, BUN to creatinine 27/1.79 (most recent labs for comparison was 2 years ago with creatinine of 1.2-1.3).  BNP 265, troponin x2-288 > 286. Chest x-ray showed stable cardiomegaly with evidence of prior median sternotomy/CABG.  He was started on IV nitroglycerin drip.  [facility] was consulted by ED physician and states that patient could be admitted here at [facility] and cardiology will see patient in the morning.  Patient was started on IV heparin drip.  Review of Systems:  Constitutional: Negative for chills and fever.  HENT: Negative for ear pain and sore throat.   Eyes: Negative for pain and visual disturbance.  Respiratory: Positive for shortness of breath.  Negative for cough, chest tightness  Cardiovascular: Positive for chest pain and negative for palpitations.  Gastrointestinal: Negative for abdominal pain and vomiting.  Endocrine: Negative for polyphagia and polyuria.  Genitourinary: Negative for decreased  urine volume, dysuria, enuresis Musculoskeletal: Negative for arthralgias and back pain.  Skin: Negative for color change and rash.  Allergic/Immunologic: Negative for immunocompromised state.  Neurological: Negative for tremors, syncope, speech difficulty  and headaches.  Hematological: Does not bruise/bleed easily.  All other systems reviewed and are negative    Past Medical History:  Diagnosis Date  . ARTERIOSCLEROSIS 02/10/2010  . CAD 02/25/2010  . DIABETES TYPE 2 02/10/2010  . Glaucoma    right eye  . HYPERLIPIDEMIA NOS 02/10/2010  . HYPERTENSION 02/10/2010  . INTERMITTENT CLAUDICATION 02/10/2010  . MI, old    Past Surgical History:  Procedure Laterality Date  . BIOPSY  12/18/2017   Procedure: BIOPSY;  Surgeon: West BaliFields, Sandi L, MD;  Location: [facility] ENDO SU[facility];  Service: Endoscopy;;  . CORONARY ARTERY BYPASS GRAFT  2005   4 vessel bypass   . ESOPHAGOGASTRODUODENOSCOPY N/A 12/18/2017   Procedure: ESOPHAGOGASTRODUODENOSCOPY (EGD);  Surgeon: West BaliFields, Sandi L, MD;  Location: [facility] END[facility];  Service: Endoscopy;  Laterality: N/A;    Social History:  reports that he has quit smoking. His smoking use included cigars. He quit smokeless tobacco use about 6 years ago.  His smokeless tobacco use included chew. He reports that he does not drink alcohol and does not use drugs.   Allergies  Allergen Reactions  . Crestor [Rosuvastatin Calcium]     myalgia    Family History  Problem Relation Age of Onset  . Diabetes Mother   . Heart disease Mother   . Diabetes Father   . Heart disease Father   . Stroke Father   . Diabetes Sister   . Diabetes Brother   .  Hypertension Other   . Colon cancer Neg Hx   . Colon polyps Neg Hx     Prior to Admission medications   Medication Sig Start Date End Date Taking? Authorizing Provider  amoxicillin (AMOXIL) 500 MG tablet 2 PO BID FOR 10 DAYS Patient not taking: Reported on 03/02/2018 12/25/17   West Bali, MD  aspirin EC 81 MG tablet Take 81  mg by mouth daily.    [provider]  atorvastatin (LIPITOR) 40 MG tablet Take 1 tablet (40 mg total) by mouth daily at 6 PM. Patient not taking: Reported on 03/02/2018 09/26/17   Catarina Hartshorn, MD  carvedilol (COREG) 6.25 MG tablet Take 1 tablet (6.25 mg total) by mouth 2 (two) times daily with a meal. 09/26/17   Tat, Onalee Hua, MD  clarithromycin (BIAXIN) 500 MG tablet 1 PO BID FOR 10 DAYS. DO NOT TAKE LIPITOR WHILE TAKING BIAXIN. 12/25/17   Fields, Darleene Cleaver, MD  diclofenac (FLECTOR) 1.3 % PTCH Place 1 patch onto the skin 2 (two) times daily. 03/02/18   Gerhard Munch, MD  hydrALAZINE (APRESOLINE) 25 MG tablet Take 1 tablet (25 mg total) by mouth every 8 (eight) hours. 09/26/17   Catarina Hartshorn, MD  HYDROcodone-acetaminophen (NORCO/VICODIN) 5-325 MG tablet Take 1 tablet by mouth every 8 (eight) hours as needed for moderate pain.    [provider]  insulin NPH Human (NOVOLIN N) 100 UNIT/ML injection Inject 0.2 mLs (20 Units total) into the skin 2 (two) times daily before a meal. 09/26/17   Tat, Onalee Hua, MD  isosorbide mononitrate (IMDUR) 30 MG 24 hr tablet Take 30 mg by mouth daily.  12/11/15   [provider]  LORazepam (ATIVAN) 0.5 MG tablet Take 0.5 mg by mouth 2 (two) times daily.    [provider]  Omega-3 Fatty Acids (FISH OIL PO) Take 1 capsule by mouth daily.    [provider]  pantoprazole (PROTONIX) 40 MG tablet Take 1 tablet (40 mg total) by mouth 2 (two) times daily before a meal. 12/19/17   Erick Blinks, MD  timolol (TIMOPTIC) 0.25 % ophthalmic solution Place 1 drop into the right eye 2 (two) times daily.  08/01/11   [provider]  traMADol (ULTRAM) 50 MG tablet Take 1 tablet (50 mg total) by mouth every 6 (six) hours as needed. 03/02/18   Gerhard Munch, MD  vitamin C (ASCORBIC ACID) 500 MG tablet Take 500 mg by mouth daily.    [provider]  VITAMIN E PO Take 1 tablet by mouth daily.    [provider]    Physical  Exam: BP (!) 187/95 (BP Location: Left Arm)   Pulse 68   Temp (!) 97.4 F (36.3 C) (Oral)   Resp 18   Ht 5\' 10"  (1.778 m)   Wt 93.4 kg   SpO2 93%   BMI 29.56 kg/m   . General: 80 y.o. year-old male well developed well nourished in no acute distress.  Alert and oriented x3. 70 HEENT: NCAT, EOMI . Neck: Supple, trachea medial . Cardiovascular: Regular rate and rhythm with no rubs or gallops.  No thyromegaly or JVD noted.  2/4 pulses in all 4 extremities. Marland Kitchen Respiratory: Clear to auscultation with no wheezes or rales. Good inspiratory effort. . Abdomen: Soft nontender nondistended with normal bowel sounds x4 quadrants. . Muskuloskeletal: No cyanosis, clubbing or edema noted bilaterally . Neuro: CN II-XII intact, sensation, reflexes . Skin: No ulcerative lesions noted or rashes . Psychiatry: Judgement and insight appear  normal. Mood is appropriate for condition and setting          Labs on Admission:  Basic Metabolic Panel: Recent Labs  Lab 04/08/20 0000  NA 136  K 3.8  CL 106  CO2 23  GLUCOSE 100*  BUN 27*  CREATININE 1.79*  CALCIUM 8.8*   Liver Function Tests: No results for input(s): AST, ALT, ALKPHOS, BILITOT, PROT, ALBUMIN in the last 168 hours. No results for input(s): LIPASE, AMYLASE in the last 168 hours. No results for input(s): AMMONIA in the last 168 hours. CBC: Recent Labs  Lab 04/08/20 0000  WBC 7.4  NEUTROABS 4.3  HGB 14.3  HCT 41.9  MCV 86.4  PLT 159   Cardiac Enzymes: No results for input(s): CKTOTAL, CKMB, CKMBINDEX, TROPONINI in the last 168 hours.  BNP (last 3 results) Recent Labs    04/08/20 0000  BNP 265.0*    ProBNP (last 3 results) No results for input(s): PROBNP in the last 8760 hours.  CBG: No results for input(s): GLUCAP in the last 168 hours.  Radiological Exams on Admission: DG Chest Portable 1 View  Result Date: 04/08/2020 CLINICAL DATA:  Shortness of breath and chest pain. EXAM: PORTABLE CHEST 1 VIEW COMPARISON:  September 24, 2017 FINDINGS: Multiple sternal wires and vascular clips are seen. Diffuse chronic appearing increased interstitial lung markings are noted. There is no evidence of acute infiltrate, pleural effusion or pneumothorax. There is moderate severity enlargement of the cardiac silhouette. The visualized skeletal structures are unremarkable. IMPRESSION: Stable cardiomegaly with evidence of prior median sternotomy/CABG. Electronically Signed   By: Aram Candela M.D.   On: 04/08/2020 00:18    EKG: I independently viewed the EKG done and my findings are as followed: Normal sinus rhythm at rate of 79 bpm with RBBB and prolonged QTc (557 ms)   Assessment/Plan Present on Admission: . NSTEMI (non-ST elevated myocardial infarction) (HCC) . Essential hypertension . CKD stage 3 due to type 2 diabetes mellitus (HCC)  Principal Problem:   Ischemic chest pain (HCC) Active Problems:   Hyperlipidemia   Essential hypertension   CAD (coronary artery disease)   Diabetes (HCC)   CKD stage 3 due to type 2 diabetes mellitus (HCC)   NSTEMI (non-ST elevated myocardial infarction) (HCC)   Prolonged QT interval   AKI (acute kidney injury) (HCC)   GERD (gastroesophageal reflux disease)   Elevated troponin   Elevated brain natriuretic peptide (BNP) level   Hypertensive urgency   Chest pain and shortness of breath possibly secondary to NSTEMI vs type II demand ischemia Elevated troponin Troponin has flattened- 288 > 286 Heart score = 5 Patient was started on IV nitroglycerin and IV heparin Cardiology on call at [facility] was consulted by ED physician and recommended admitting patient here at [facility] with plan for cardiology to see patient in the morning.  Elevated BNP (265) R/O CHF Creatinine is elevated, So, the BNP may not be reliable Chest x-ray showed stable cardiomegaly with evidence of prior median sternotomy/CABG.  Continue total input/output, daily weights and fluid restriction Continue Cardiac  diet  Echocardiogram done on 09/26/2017 showed LVEF of 60-65%.  Echocardiogram will be done in the morning   Acute kidney injury on CKD 3B BUN to creatinine 27/1.79 (most recent labs for comparison was 2 years ago with creatinine of 1.2-1.3) Renally adjust medications, avoid nephrotoxic agents/dehydration/hypotension  Prolonged QTc (557 ms) Avoid QT prolonging drugs Magnesium level will be checked  Hypertensive urgency Essential hypertension Continue IV nitroglycerin  Hyperlipidemia Continue  Lovaza  GERD Continue Protonix  T2DM Continue ISS and hypoglycemic protocol  Other meds: Timolol eyedrops  DVT prophylaxis: Heparin drip  Code Status: Full code  Family Communication: None at bedside  Disposition Plan:  Patient is from:                        home Anticipated DC to:                   SNF or family members home Anticipated DC date:               2-3 days Anticipated DC barriers:          Patient requires inpatient management for possible NSTEMI and pending cardiology consult with further recommendation  Consults called: Cardiology  Admission status: Inpatient    Frankey Shown MD [facility]  04/08/2020, 5:51 AM

## 2020-04-08 NOTE — Progress Notes (Addendum)
pr ANTICOAGULATION CONSULT NOTE -  Pharmacy Consult for Heparin Indication: chest pain/ACS  Allergies  Allergen Reactions  . Crestor [Rosuvastatin Calcium]     myalgia    Patient Measurements: Height: 5\' 10"  (177.8 cm) Weight: 93.4 kg (206 lb) IBW/kg (Calculated) : 73 Heparin Dosing Weight: 90 kg  Vital Signs: Temp: 97.4 F (36.3 C) (02/15 2327) Temp Source: Oral (02/15 2327) BP: 192/103 (02/16 0625) Pulse Rate: 68 (02/16 0625)  Labs: Recent Labs    04/08/20 0000 04/08/20 0253 04/08/20 0743  HGB 14.3  --   --   HCT 41.9  --   --   PLT 159  --   --   HEPARINUNFRC  --   --  0.32  CREATININE 1.79*  --   --   TROPONINIHS 288* 286*  --     Estimated Creatinine Clearance: 39.1 mL/min (A) (by C-G formula based on SCr of 1.79 mg/dL (H)).   Medical History: Past Medical History:  Diagnosis Date  . ARTERIOSCLEROSIS 02/10/2010  . CAD 02/25/2010  . DIABETES TYPE 2 02/10/2010  . Glaucoma    right eye  . HYPERLIPIDEMIA NOS 02/10/2010  . HYPERTENSION 02/10/2010  . INTERMITTENT CLAUDICATION 02/10/2010  . MI, old     Medications:  No current facility-administered medications on file prior to encounter.   Current Outpatient Medications on File Prior to Encounter  Medication Sig Dispense Refill  . aspirin EC 81 MG tablet Take 81 mg by mouth daily.    02/12/2010 atorvastatin (LIPITOR) 20 MG tablet Take 20 mg by mouth daily.    . carvedilol (COREG) 25 MG tablet Take 25 mg by mouth 2 (two) times daily.    . celecoxib (CELEBREX) 200 MG capsule Take 1 capsule by mouth daily.    . citalopram (CELEXA) 20 MG tablet Take 20 mg by mouth daily.    . clopidogrel (PLAVIX) 75 MG tablet Take 75 mg by mouth daily.    . dorzolamide-timolol (COSOPT) 22.3-6.8 MG/ML ophthalmic solution Place 1 drop into both eyes in the morning and at bedtime.    . furosemide (LASIX) 40 MG tablet Take 40 mg by mouth daily.    . hydrALAZINE (APRESOLINE) 25 MG tablet Take 1 tablet (25 mg total) by mouth every 8  (eight) hours. 90 tablet 1  . insulin NPH Human (NOVOLIN N) 100 UNIT/ML injection Inject 0.2 mLs (20 Units total) into the skin 2 (two) times daily before a meal. 10 mL 0  . isosorbide mononitrate (IMDUR) 30 MG 24 hr tablet Take 30 mg by mouth daily.     Marland Kitchen LORazepam (ATIVAN) 0.5 MG tablet Take 0.5 mg by mouth 2 (two) times daily.    . metFORMIN (GLUCOPHAGE) 500 MG tablet Take 500 mg by mouth daily.    . Omega-3 Fatty Acids (FISH OIL PO) Take 1 capsule by mouth daily.    Marland Kitchen oxyCODONE (ROXICODONE) 15 MG immediate release tablet Take 15 mg by mouth 4 (four) times daily as needed.    . pantoprazole (PROTONIX) 40 MG tablet Take 1 tablet (40 mg total) by mouth 2 (two) times daily before a meal. 60 tablet 1  . QUEtiapine (SEROQUEL) 25 MG tablet Take 1 tablet by mouth in the morning and at bedtime.    Marland Kitchen amoxicillin (AMOXIL) 500 MG tablet 2 PO BID FOR 10 DAYS (Patient not taking: No sig reported) 40 tablet 0  . clarithromycin (BIAXIN) 500 MG tablet 1 PO BID FOR 10 DAYS. DO NOT TAKE LIPITOR WHILE TAKING BIAXIN. (Patient  not taking: Reported on 04/08/2020) 20 tablet 0  . [DISCONTINUED] atorvastatin (LIPITOR) 40 MG tablet Take 1 tablet (40 mg total) by mouth daily at 6 PM. (Patient not taking: Reported on 03/02/2018) 30 tablet 1  . [DISCONTINUED] carvedilol (COREG) 6.25 MG tablet Take 1 tablet (6.25 mg total) by mouth 2 (two) times daily with a meal. 60 tablet 1  . [DISCONTINUED] diclofenac (FLECTOR) 1.3 % PTCH Place 1 patch onto the skin 2 (two) times daily. 5 patch 1  . [DISCONTINUED] HYDROcodone-acetaminophen (NORCO/VICODIN) 5-325 MG tablet Take 1 tablet by mouth every 8 (eight) hours as needed for moderate pain.    . [DISCONTINUED] timolol (TIMOPTIC) 0.25 % ophthalmic solution Place 1 drop into the right eye 2 (two) times daily.     . [DISCONTINUED] traMADol (ULTRAM) 50 MG tablet Take 1 tablet (50 mg total) by mouth every 6 (six) hours as needed. 15 tablet 0  . [DISCONTINUED] vitamin C (ASCORBIC ACID) 500 MG  tablet Take 500 mg by mouth daily.    . [DISCONTINUED] VITAMIN E PO Take 1 tablet by mouth daily.       Assessment: 80 y.o. male with SOB, elevated troponin for heparin HL 0.32 is therapeutic  Goal of Therapy:  Heparin level 0.3-0.7 units/ml Monitor platelets by anticoagulation protocol: Yes   Plan:  Continue heparin 1100 units/hr Check heparin level in 6 hours.  Monitor for S/S of bleeding For Cardiac Cath  Elder Cyphers, BS Loura Back, BCPS Clinical Pharmacist Pager [phone] 04/08/2020,9:43 AM

## 2020-04-08 NOTE — ED Notes (Signed)
Date and time results received: 04/08/20 04/09/2019 (use smartphrase ".now" to insert current time)  Test: Tropinin Critical Value: 288  Name of Provider Notified: Pollina  Orders Received? Or Actions Taken?: None

## 2020-04-08 NOTE — ED Notes (Signed)
ED Provider at bedside. 

## 2020-04-08 NOTE — Consult Note (Addendum)
Cardiology Consultation:   Patient ID: Steven Osborne MRN: [id number]; DOB: Dec 14, 1940  Admit date: 04/07/2020 Date of Consult: 04/08/2020  PCP:  Jason Coop, FNP   Cardiologist: [facility] - Dr. Tiburcio Pea  Patient Profile:   Steven Osborne is a 80 y.o. male with a past medical history of CAD (s/p CABG in 2005, cath in 12/2014 showing patent LIMA-LAD, patent native RCA and LCx with occluded SVG-RCA, occluded SVG-OM and occluded SVG-Diagonal with medical management recommended given collateral flow), chronic combined systolic and diastolic CHF/Ischemic Cardiomyopathy (EF previously 30-35%, at 40-45% by echo in 04/2018), HTN, HLD and Type 2 DM who is being seen today for the evaluation of NSTEMI at the request of Dr. Thomes Dinning.  History of Present Illness:   Mr. Lunney presented to [facility] ED on 04/07/2020 for evaluation of worsening chest pain and dyspnea on exertion. He reports he was overall doing well until 3-4 weeks ago when he started to develop sternal chest discomfort and dyspnea on exertion with minimal activity. Says this can occur when walking from room to room in his home or climbing steps. Symptoms resolve with rest and he has not had to utilize SL NTG. He denies any recent orthopnea, PND or lower extremity edema. Says he has been compliant with his medications and BP was well-controlled when checked by a family member yesterday.   BP was elevated to 215/98 upon arrival to the ED. Initial labs show WBC 7.4, Hgb 14.3, platelets 159, Na+ 136, K+ 3.8 and creatinine 1.79 (was at 1.30 by labs in 04/2018 in Care Everywhere. BNP 265. Initial HS Troponin 288 with repeat of 286. COVID negative. CXR showing stable cardiomegaly with no acute findings. EKG shows NSR, HR 79 with known RBBB.   He was started on IV Heparin along with IV NTG at the time of admission. IV NTG recently discontinued by the admitting team as he was pain-free and this was not helping with his BP.    Past  Medical History:  Diagnosis Date   ARTERIOSCLEROSIS 02/10/2010   CAD 02/25/2010   DIABETES TYPE 2 02/10/2010   Glaucoma    right eye   HYPERLIPIDEMIA NOS 02/10/2010   HYPERTENSION 02/10/2010   INTERMITTENT CLAUDICATION 02/10/2010   MI, old     Past Surgical History:  Procedure Laterality Date   BIOPSY  12/18/2017   Procedure: BIOPSY;  Surgeon: West Bali, MD;  Location: [facility];  Service: Endoscopy;;   CORONARY ARTERY BYPASS GRAFT  2005   4 vessel bypass    ESOPHAGOGASTRODUODENOSCOPY N/A 12/18/2017   Procedure: ESOPHAGOGASTRODUODENOSCOPY (EGD);  Surgeon: West Bali, MD;  Location: [facility];  Service: Endoscopy;  Laterality: N/A;     Home Medications:  Prior to Admission medications   Medication Sig Start Date End Date Taking? Authorizing Provider  amoxicillin (AMOXIL) 500 MG tablet 2 PO BID FOR 10 DAYS Patient not taking: No sig reported 12/25/17   West Bali, MD  aspirin EC 81 MG tablet Take 81 mg by mouth daily.    [provider]  atorvastatin (LIPITOR) 20 MG tablet Take 20 mg by mouth daily. 11/08/19   [provider]  carvedilol (COREG) 25 MG tablet Take 25 mg by mouth 2 (two) times daily. 04/02/20   [provider]  celecoxib (CELEBREX) 200 MG capsule Take 1 capsule by mouth daily. 02/06/20   [provider]  citalopram (CELEXA) 20 MG tablet Take 20 mg by mouth daily. 02/06/20   [provider]  clarithromycin (BIAXIN) 500 MG tablet 1 PO BID FOR 10 DAYS. DO NOT TAKE LIPITOR WHILE TAKING BIAXIN. 12/25/17   Fields, Darleene CleaverSandi L, MD  clopidogrel (PLAVIX) 75 MG tablet Take 75 mg by mouth daily. 02/06/20   [provider]  dorzolamide-timolol (COSOPT) 22.3-6.8 MG/ML ophthalmic solution Place 1 drop into both eyes in the morning and at bedtime. 03/31/20   [provider]  furosemide (LASIX) 40 MG tablet Take 40 mg by mouth daily. 02/06/20   [provider]  hydrALAZINE (APRESOLINE) 25 MG tablet  Take 1 tablet (25 mg total) by mouth every 8 (eight) hours. 09/26/17   Catarina Hartshornat, David, MD  insulin NPH Human (NOVOLIN N) 100 UNIT/ML injection Inject 0.2 mLs (20 Units total) into the skin 2 (two) times daily before a meal. 09/26/17   Tat, Onalee Huaavid, MD  isosorbide mononitrate (IMDUR) 30 MG 24 hr tablet Take 30 mg by mouth daily.  12/11/15   [provider]  LORazepam (ATIVAN) 0.5 MG tablet Take 0.5 mg by mouth 2 (two) times daily.    [provider]  metFORMIN (GLUCOPHAGE) 500 MG tablet Take 500 mg by mouth daily. 02/06/20   [provider]  Omega-3 Fatty Acids (FISH OIL PO) Take 1 capsule by mouth daily.    [provider]  oxyCODONE (ROXICODONE) 15 MG immediate release tablet Take 15 mg by mouth 4 (four) times daily as needed. 04/04/20   [provider]  pantoprazole (PROTONIX) 40 MG tablet Take 1 tablet (40 mg total) by mouth 2 (two) times daily before a meal. 12/19/17   Erick BlinksMemon, Jehanzeb, MD  QUEtiapine (SEROQUEL) 25 MG tablet Take 1 tablet by mouth in the morning and at bedtime. 02/11/20   [provider]    Inpatient Medications: Scheduled Meds:  aspirin EC  81 mg Oral Daily   atorvastatin  20 mg Oral q1800   carvedilol  25 mg Oral BID   clopidogrel  75 mg Oral Daily   hydrALAZINE  25 mg Oral Q8H   insulin aspart  0-5 Units Subcutaneous QHS   insulin aspart  0-9 Units Subcutaneous TID WC   isosorbide mononitrate  30 mg Oral Daily   omega-3 acid ethyl esters  1 g Oral Daily   pantoprazole  40 mg Oral BID AC   timolol  1 drop Right Eye BID   Continuous Infusions:  sodium chloride     heparin 1,100 Units/hr (04/08/20 0144)   PRN Meds: hydrALAZINE  Allergies:    Allergies  Allergen Reactions   Crestor [Rosuvastatin Calcium]     myalgia    Social History:   Social History   Socioeconomic History   Marital status: Divorced    Spouse name: Not on file   Number of children: Not on file   Years of education: Not on file   Highest  education level: Not on file  Occupational History   Not on file  Tobacco Use   Smoking status: Former Smoker    Types: Cigars   Smokeless tobacco: Former NeurosurgeonUser    Types: Chew    Quit date: 02/21/2014  Vaping Use   Vaping Use: Never used  Substance and Sexual Activity   Alcohol use: No    Comment: as a teenager to late 20s, none since    Drug use: No   Sexual activity: Not on file  Other Topics Concern   Not on file  Social History Narrative   Divorced, lives alone   Regular exercise-yes  Social Determinants of Health   Financial Resource Strain: Not on file  Food Insecurity: Not on file  Transportation Needs: Not on file  Physical Activity: Not on file  Stress: Not on file  Social Connections: Not on file  Intimate Partner Violence: Not on file    Family History:    Family History  Problem Relation Age of Onset   Diabetes Mother    Heart disease Mother    Diabetes Father    Heart disease Father    Stroke Father    Diabetes Sister    Diabetes Brother    Hypertension Other    Colon cancer Neg Hx    Colon polyps Neg Hx      ROS:  Please see the history of present illness.   All other ROS reviewed and negative.     Physical Exam/Data:   Vitals:   04/08/20 0431 04/08/20 0532 04/08/20 0559 04/08/20 0625  BP: (!) 189/103 (!) 187/95 (!) 215/98 (!) 192/103  Pulse: 66 68 65 68  Resp: Temp:      TempSrc:      SpO2: 94% 93% 93% 96%  Weight:      Height:        Intake/Output Summary (Last 24 hours) at 04/08/2020 0852 Last data filed at 04/08/2020 0255 Gross per 24 hour  Intake 2.8 ml  Output --  Net 2.8 ml   Last 3 Weights 04/07/2020 03/02/2018 12/19/2017  Weight (lbs) 206 lb 210 lb 196 lb 6.9 oz  Weight (kg) 93.441 kg 95.255 kg 89.1 kg     Body mass index is 29.56 kg/m.  General:  Well nourished, well developed male appearing in no acute distress HEENT: normal Lymph: no adenopathy Neck: no JVD  Endocrine:  No thryomegaly Vascular: No  carotid bruits; FA pulses 2+ bilaterally without bruits  Cardiac:  normal S1, S2; RRR; 2/6 SEM along RUSB.  Lungs:  clear to auscultation bilaterally, no wheezing, rhonchi or rales  Abd: soft, nontender, no hepatomegaly  Ext: no edema Musculoskeletal:  No deformities, BUE and BLE strength normal and equal Skin: warm and dry  Neuro:  CNs 2-12 intact, no focal abnormalities noted Psych:  Normal affect   EKG:  The EKG was personally reviewed and demonstrates: NSR, HR 79 with known RBBB.   Relevant CV Studies:  Cardiac Catheterization: 12/2014 1.  Severe multivessel coronary disease  2.  Patent native RCA and patent native circumflex  3.  Patent LIMA to the LAD  4.  Occluded vein graft to the RCA, OM, diagonal  5.  Ischemic cardiomyopathy with ejection fraction estimated to be 30%  6.  Normal left heart pressures  7.   Recent myocardial infarction     Echocardiogram: 04/2018 The left ventricle is normal in size.  There is mild concentric left ventricular hypertrophy.  There is anterolateral wall hypokinesis.  The left ventricular ejection fraction is moderately reduced (40-45%).  Grade II moderate diastolic dysfunction; pseudonormal mitral inflow  pattern.  The left atrium is mildly dilated.  The tricuspid valve leaflets are structurally normal with mild (1+)  regurgitation.  Estimation of right ventricular systolic pressure is not possible.  The aortic valve is trileaflet.  There is mild aortic valve thickening with no regurgitation.     Laboratory Data:  High Sensitivity Troponin:   Recent Labs  Lab 04/08/20 0000 04/08/20 0253  TROPONINIHS 288* 286*     Chemistry Recent Labs  Lab 04/08/20 0000  NA 136  K 3.8  CL 106  CO2 23  GLUCOSE 100*  BUN 27*  CREATININE 1.79*  CALCIUM 8.8*  GFRNONAA 38*  ANIONGAP 7    No results for input(s): PROT, ALBUMIN, AST, ALT, ALKPHOS, BILITOT in the last 168 hours. Hematology Recent Labs  Lab 04/08/20 0000  WBC 7.4  RBC  4.85  HGB 14.3  HCT 41.9  MCV 86.4  MCH 29.5  MCHC 34.1  RDW 13.8  PLT 159   BNP Recent Labs  Lab 04/08/20 0000  BNP 265.0*    DDimer No results for input(s): DDIMER in the last 168 hours.   Radiology/Studies:  DG Chest Portable 1 View  Result Date: 04/08/2020 CLINICAL DATA:  Shortness of breath and chest pain. EXAM: PORTABLE CHEST 1 VIEW COMPARISON:  September 24, 2017 FINDINGS: Multiple sternal wires and vascular clips are seen. Diffuse chronic appearing increased interstitial lung markings are noted. There is no evidence of acute infiltrate, pleural effusion or pneumothorax. There is moderate severity enlargement of the cardiac silhouette. The visualized skeletal structures are unremarkable. IMPRESSION: Stable cardiomegaly with evidence of prior median sternotomy/CABG. Electronically Signed   By: Aram Candela M.D.   On: 04/08/2020 00:18     Assessment and Plan:   1. NSTEMI - He presents with a 3-4 week history of progressive dyspnea on exertion and chest pain on exertion which resolves with rest and most concerning for unstable angina.  -  Initial HS Troponin 288 with repeat of 286. EKG shows NSR, HR 79 with known RBBB. - Reviewed with the patient and would anticipate a cardiac catheterization later today after discussing with Dr. Eden Emms. The patient understands that risks include but are not limited to stroke (1 in 1000), death (1 in 1000), kidney failure [usually temporary] (1 in 500), bleeding (1 in 200), allergic reaction [possibly serious] (1 in 200).   - Continue Heparin along with ASA, Plavix, Lipitor, Imdur and Coreg.   2. CAD - He is s/p CABG in 2005 with most recent cath in 12/2014 showing patent LIMA-LAD, patent native RCA and LCx with occluded SVG-RCA, occluded SVG-OM and occluded SVG-Diagonal with medical management recommended given collateral flow. - Will plan for repeat cath as outlined above.  - Continue PTA ASA 81mg  daily, Atorvastatin 20mg  daily, Plavix 75mg   daily, Coreg 25mg  BID and Imdur 30mg  daily.   3. Chronic Combined Systolic and Diastolic CHF/Ischemic Cardiomyopathy - His EF was previously 30-35%, at 40-45% by echo in 04/2018. He denies any recent orthopnea, PNF or edema. BNP was 265 on admission. Will hold PTA Lasix for now and start IVF given his CKD and upcoming cath. Will review with cath lab but anticipate starting 100 mL/hr instead of typical dosing given his EF.  - A repeat echo has been ordered. If EF remains reduced, would consider the addition of an ACE-I or ARB pending renal function following cath. Will continue PTA Coreg, Hydralazine and Imdur for now.   4. Hypertensive Urgency - BP was initially elevated at 215/98 and SBP remains in the 190's. PRN IV Hydralazine has been ordered by the admitting team. Will restart his PTA Hydralazine 25mg  TID (which can be further titrated) and Coreg 25mg  BID. Now that he is off IV NTG, will reorder PTA Imdur 30mg  daily.   5. Type 2 DM - Repeat Hgb A1c pending. Per admitting team. SSI has been ordered given Metformin is currently held.   6. Stage 3 CKD - Creatinine was previously at 1.30 in 04/2018 but no recent vales  for comparison. At 1.79 on admission. Will hydrate prior to cath and hold PTA Lasix and NSAIDS.    Risk Assessment/Risk Scores:     TIMI Risk Score for Unstable Angina or Non-ST Elevation MI:   The patient's TIMI risk score is 6, which indicates a 41% risk of all cause mortality, new or recurrent myocardial infarction or need for urgent revascularization in the next 14 days.          For questions or updates, please contact [facility] Please consult [email or web address] for contact info under    Signed, Ellsworth Lennox, PA-C  04/08/2020 8:52 AM  Patient examined chart reviewed Exam with overweight white male no murmur post sternotomy basilar crackles soft abdomen trace LE edema. Unstable angina with mildly elevated troponin no trend ECG with no acute ST elevation RBBB  Known graft failure by cath 2016 and depressed EF. Continue Rx for BP including nitrates, beta blocker and hydralazine Transfer to [facility] for cath Discussed with patient and he is willing to proceed On ASA/Plavix continue heparin   Charlton Haws MD [facility]

## 2020-04-08 NOTE — ED Notes (Signed)
Critical troponin of 288 per lab. Dr. Blinda Leatherwood informed.

## 2020-04-08 NOTE — Progress Notes (Incomplete)
Patient c/o chest pain after amu

## 2020-04-08 NOTE — Progress Notes (Signed)
ANTICOAGULATION CONSULT NOTE  - Follow Up Consult  Pharmacy Consult for IV Heparin Indication: chest pain/ACS  Allergies  Allergen Reactions  . Crestor [Rosuvastatin Calcium]     myalgia    Patient Measurements: Height: 5\' 10"  (177.8 cm) Weight: 93.4 kg (206 lb) IBW/kg (Calculated) : 73 Heparin Dosing Weight: 90 kg  Vital Signs: Temp: 97.3 F (36.3 C) (02/16 1536) Temp Source: Oral (02/16 1536) BP: 181/91 (02/16 1536) Pulse Rate: 65 (02/16 1536)  Labs: Recent Labs    04/08/20 0000 04/08/20 0253 04/08/20 0743 04/08/20 1047 04/08/20 1612  HGB 14.3  --   --   --   --   HCT 41.9  --   --   --   --   PLT 159  --   --   --   --   HEPARINUNFRC  --   --  0.32  --  0.19*  CREATININE 1.79*  --   --  1.42*  --   TROPONINIHS 288* 286*  --   --   --     Estimated Creatinine Clearance: 49.2 mL/min (A) (by C-G formula based on SCr of 1.42 mg/dL (H)).   Medical History: Past Medical History:  Diagnosis Date  . ARTERIOSCLEROSIS 02/10/2010  . CAD 02/25/2010  . DIABETES TYPE 2 02/10/2010  . Glaucoma    right eye  . HYPERLIPIDEMIA NOS 02/10/2010  . HYPERTENSION 02/10/2010  . INTERMITTENT CLAUDICATION 02/10/2010  . MI, old     Assessment: 80 y.o. male with hx of CAD with worsening CP and DOE, elevated troponins presented to [facility]. Pt was started on IV heparin and transferred to [facility] this afternoon for cardiac cath tomorrow.   Pt had therapeutic heparin level (0.32 units/ml) on heparin infusion at 1100 units/hr earlier today. Confirmatory heparin ~8.5 hrs later (after admission to [facility]) was 0.19 units/ml, which is below the goal range for this pt. H/H, plt WNL. Per RN, no issues with IV or bleeding observed.   Goal of Therapy:  Heparin level 0.3-0.7 units/ml Monitor platelets by anticoagulation protocol: Yes   Plan:  Heparin 2500 units IV bolus X 1 Increase heparin infusion to 1350 units/hr Check heparin level in 8 hrs Monitor daily  heparin level, CBC Monitor for bleeding F/U after cardiac cath on 2/17  3/17, PharmD, BCPS, [facility] Clinical Pharmacist 04/08/2020,5:22 PM

## 2020-04-08 NOTE — Progress Notes (Signed)
pr ANTICOAGULATION CONSULT NOTE - Initial Consult  Pharmacy Consult for Heparin Indication: chest pain/ACS  Allergies  Allergen Reactions  . Crestor [Rosuvastatin Calcium]     myalgia    Patient Measurements: Height: 5\' 10"  (177.8 cm) Weight: 93.4 kg (206 lb) IBW/kg (Calculated) : 73 Heparin Dosing Weight: 90 kg  Vital Signs: Temp: 97.4 F (36.3 C) (02/15 2327) Temp Source: Oral (02/15 2327) BP: 161/95 (02/16 0115) Pulse Rate: 68 (02/16 0115)  Labs: Recent Labs    04/08/20 0000  HGB 14.3  HCT 41.9  PLT 159  CREATININE 1.79*  TROPONINIHS 288*    Estimated Creatinine Clearance: 39.1 mL/min (A) (by C-G formula based on SCr of 1.79 mg/dL (H)).   Medical History: Past Medical History:  Diagnosis Date  . ARTERIOSCLEROSIS 02/10/2010  . CAD 02/25/2010  . DIABETES TYPE 2 02/10/2010  . Glaucoma    right eye  . HYPERLIPIDEMIA NOS 02/10/2010  . HYPERTENSION 02/10/2010  . INTERMITTENT CLAUDICATION 02/10/2010  . MI, old     Medications:  No current facility-administered medications on file prior to encounter.   Current Outpatient Medications on File Prior to Encounter  Medication Sig Dispense Refill  . amoxicillin (AMOXIL) 500 MG tablet 2 PO BID FOR 10 DAYS (Patient not taking: Reported on 03/02/2018) 40 tablet 0  . aspirin EC 81 MG tablet Take 81 mg by mouth daily.    05/01/2018 atorvastatin (LIPITOR) 40 MG tablet Take 1 tablet (40 mg total) by mouth daily at 6 PM. (Patient not taking: Reported on 03/02/2018) 30 tablet 1  . carvedilol (COREG) 6.25 MG tablet Take 1 tablet (6.25 mg total) by mouth 2 (two) times daily with a meal. 60 tablet 1  . clarithromycin (BIAXIN) 500 MG tablet 1 PO BID FOR 10 DAYS. DO NOT TAKE LIPITOR WHILE TAKING BIAXIN. 20 tablet 0  . diclofenac (FLECTOR) 1.3 % PTCH Place 1 patch onto the skin 2 (two) times daily. 5 patch 1  . hydrALAZINE (APRESOLINE) 25 MG tablet Take 1 tablet (25 mg total) by mouth every 8 (eight) hours. 90 tablet 1  .  HYDROcodone-acetaminophen (NORCO/VICODIN) 5-325 MG tablet Take 1 tablet by mouth every 8 (eight) hours as needed for moderate pain.    05/01/2018 insulin NPH Human (NOVOLIN N) 100 UNIT/ML injection Inject 0.2 mLs (20 Units total) into the skin 2 (two) times daily before a meal. 10 mL 0  . isosorbide mononitrate (IMDUR) 30 MG 24 hr tablet Take 30 mg by mouth daily.     Marland Kitchen LORazepam (ATIVAN) 0.5 MG tablet Take 0.5 mg by mouth 2 (two) times daily.    . Omega-3 Fatty Acids (FISH OIL PO) Take 1 capsule by mouth daily.    . pantoprazole (PROTONIX) 40 MG tablet Take 1 tablet (40 mg total) by mouth 2 (two) times daily before a meal. 60 tablet 1  . timolol (TIMOPTIC) 0.25 % ophthalmic solution Place 1 drop into the right eye 2 (two) times daily.     . traMADol (ULTRAM) 50 MG tablet Take 1 tablet (50 mg total) by mouth every 6 (six) hours as needed. 15 tablet 0  . vitamin C (ASCORBIC ACID) 500 MG tablet Take 500 mg by mouth daily.    Marland Kitchen VITAMIN E PO Take 1 tablet by mouth daily.       Assessment: 80 y.o. male with SOB, elevated troponin for heparin  Goal of Therapy:  Heparin level 0.3-0.7 units/ml Monitor platelets by anticoagulation protocol: Yes   Plan:  Heparin 4000 units IV  bolus, then start heparin 1100 units/hr Check heparin level in 6 hours.   Geannie Risen, PharmD, BCPS   Merelyn Klump, Gary Fleet 04/08/2020,1:25 AM

## 2020-04-09 ENCOUNTER — Inpatient Hospital Stay (HOSPITAL_COMMUNITY): Payer: Medicare HMO

## 2020-04-09 ENCOUNTER — Encounter (HOSPITAL_COMMUNITY)
Admission: EM | Disposition: A | Discharge status: Home / Self Care | Payer: Self-pay | Source: Home / Self Care | Attending: Cardiovascular Disease

## 2020-04-09 DIAGNOSIS — I25810 Atherosclerosis of coronary artery bypass graft(s) without angina pectoris: Secondary | ICD-10-CM

## 2020-04-09 DIAGNOSIS — I2510 Atherosclerotic heart disease of native coronary artery without angina pectoris: Secondary | ICD-10-CM | POA: Diagnosis not present

## 2020-04-09 DIAGNOSIS — I214 Non-ST elevation (NSTEMI) myocardial infarction: Principal | ICD-10-CM

## 2020-04-09 DIAGNOSIS — I10 Essential (primary) hypertension: Secondary | ICD-10-CM

## 2020-04-09 DIAGNOSIS — I209 Angina pectoris, unspecified: Secondary | ICD-10-CM | POA: Diagnosis not present

## 2020-04-09 DIAGNOSIS — I255 Ischemic cardiomyopathy: Secondary | ICD-10-CM

## 2020-04-09 DIAGNOSIS — I25110 Atherosclerotic heart disease of native coronary artery with unstable angina pectoris: Secondary | ICD-10-CM

## 2020-04-09 DIAGNOSIS — R079 Chest pain, unspecified: Secondary | ICD-10-CM

## 2020-04-09 DIAGNOSIS — E782 Mixed hyperlipidemia: Secondary | ICD-10-CM

## 2020-04-09 HISTORY — PX: CORONARY STENT INTERVENTION: CATH118234

## 2020-04-09 HISTORY — PX: INTRAVASCULAR IMAGING/OCT: CATH118326

## 2020-04-09 HISTORY — PX: LEFT HEART CATH AND CORS/GRAFTS ANGIOGRAPHY: CATH118250

## 2020-04-09 LAB — COMPREHENSIVE METABOLIC PANEL
ALT: 15 U/L (ref 0–44)
AST: 17 U/L (ref 15–41)
Albumin: 3.2 g/dL — ABNORMAL LOW (ref 3.5–5.0)
Alkaline Phosphatase: 65 U/L (ref 38–126)
Anion gap: 10 (ref 5–15)
BUN: 21 mg/dL (ref 8–23)
CO2: 19 mmol/L — ABNORMAL LOW (ref 22–32)
Calcium: 8.8 mg/dL — ABNORMAL LOW (ref 8.9–10.3)
Chloride: 106 mmol/L (ref 98–111)
Creatinine, Ser: 1.49 mg/dL — ABNORMAL HIGH (ref 0.61–1.24)
GFR, Estimated: 48 mL/min — ABNORMAL LOW (ref >60)
Glucose, Bld: 200 mg/dL — ABNORMAL HIGH (ref 70–99)
Potassium: 3.9 mmol/L (ref 3.5–5.1)
Sodium: 135 mmol/L (ref 135–145)
Total Bilirubin: 1.1 mg/dL (ref 0.3–1.2)
Total Protein: 6.3 g/dL — ABNORMAL LOW (ref 6.5–8.1)

## 2020-04-09 LAB — POCT ACTIVATED CLOTTING TIME
Activated Clotting Time: 297 seconds
Activated Clotting Time: 380 seconds

## 2020-04-09 LAB — LIPID PANEL
Cholesterol: 257 mg/dL — ABNORMAL HIGH (ref 0–200)
HDL: 30 mg/dL — ABNORMAL LOW (ref >40)
LDL Cholesterol: 183 mg/dL — ABNORMAL HIGH (ref 0–99)
Total CHOL/HDL Ratio: 8.6 RATIO
Triglycerides: 222 mg/dL — ABNORMAL HIGH (ref <150)
VLDL: 44 mg/dL — ABNORMAL HIGH (ref 0–40)

## 2020-04-09 LAB — HEPARIN LEVEL (UNFRACTIONATED): Heparin Unfractionated: 0.34 IU/mL (ref 0.30–0.70)

## 2020-04-09 LAB — CBC
HCT: 38.7 % — ABNORMAL LOW (ref 39.0–52.0)
Hemoglobin: 13.6 g/dL (ref 13.0–17.0)
MCH: 29.6 pg (ref 26.0–34.0)
MCHC: 35.1 g/dL (ref 30.0–36.0)
MCV: 84.3 fL (ref 80.0–100.0)
Platelets: 145 10*3/uL — ABNORMAL LOW (ref 150–400)
RBC: 4.59 MIL/uL (ref 4.22–5.81)
RDW: 13.7 % (ref 11.5–15.5)
WBC: 8.1 10*3/uL (ref 4.0–10.5)
nRBC: 0 % (ref 0.0–0.2)

## 2020-04-09 LAB — APTT: aPTT: 74 seconds — ABNORMAL HIGH (ref 24–36)

## 2020-04-09 LAB — ECHOCARDIOGRAM COMPLETE
Area-P 1/2: 3.91 cm2
Height: 70 in
S' Lateral: 3.7 cm
Weight: 3270.4 oz

## 2020-04-09 LAB — HEMOGLOBIN A1C
Hgb A1c MFr Bld: 7.8 % — ABNORMAL HIGH (ref 4.8–5.6)
Mean Plasma Glucose: 177 mg/dL

## 2020-04-09 LAB — PROTIME-INR
INR: 1.2 (ref 0.8–1.2)
Prothrombin Time: 14.3 seconds (ref 11.4–15.2)

## 2020-04-09 LAB — GLUCOSE, CAPILLARY
Glucose-Capillary: 201 mg/dL — ABNORMAL HIGH (ref 70–99)
Glucose-Capillary: 286 mg/dL — ABNORMAL HIGH (ref 70–99)
Glucose-Capillary: 231 mg/dL — ABNORMAL HIGH (ref 70–99)

## 2020-04-09 SURGERY — LEFT HEART CATH AND CORS/GRAFTS ANGIOGRAPHY
Anesthesia: LOCAL

## 2020-04-09 MED ORDER — HEPARIN SODIUM (PORCINE) 1000 UNIT/ML IJ SOLN
INTRAMUSCULAR | Status: DC | PRN
  Administered 2020-04-09 (×2): 4500 [IU] via INTRAVENOUS
  Administered 2020-04-09: 2000 [IU] via INTRAVENOUS

## 2020-04-09 MED ORDER — VERAPAMIL HCL 2.5 MG/ML IV SOLN
INTRAVENOUS | Status: AC
  Filled 2020-04-09: qty 2

## 2020-04-09 MED ORDER — PERFLUTREN LIPID MICROSPHERE
1.0000 mL | INTRAVENOUS | Status: AC | PRN
  Administered 2020-04-09: 4 mL via INTRAVENOUS
  Filled 2020-04-09: qty 10

## 2020-04-09 MED ORDER — HEPARIN SODIUM (PORCINE) 1000 UNIT/ML IJ SOLN
INTRAMUSCULAR | Status: AC
  Filled 2020-04-09: qty 1

## 2020-04-09 MED ORDER — SODIUM CHLORIDE 0.9 % IV SOLN: INTRAVENOUS | Status: DC

## 2020-04-09 MED ORDER — LIDOCAINE HCL (PF) 1 % IJ SOLN
INTRAMUSCULAR | Status: AC
  Filled 2020-04-09: qty 30

## 2020-04-09 MED ORDER — HEPARIN (PORCINE) IN NACL 1000-0.9 UT/500ML-% IV SOLN
INTRAVENOUS | Status: DC | PRN
  Administered 2020-04-09 (×2): 500 mL

## 2020-04-09 MED ORDER — NITROGLYCERIN 2 % TD OINT
1.0000 [in_us] | TOPICAL_OINTMENT | Freq: Four times a day (QID) | TRANSDERMAL | Status: DC
  Filled 2020-04-09: qty 30

## 2020-04-09 MED ORDER — ENOXAPARIN SODIUM 40 MG/0.4ML ~~LOC~~ SOLN
40.0000 mg | SUBCUTANEOUS | Status: DC
  Administered 2020-04-10: 40 mg via SUBCUTANEOUS
  Filled 2020-04-09: qty 0.4

## 2020-04-09 MED ORDER — CLOPIDOGREL BISULFATE 75 MG PO TABS
300.0000 mg | ORAL_TABLET | Freq: Once | ORAL | Status: AC
  Administered 2020-04-09: 300 mg via ORAL
  Filled 2020-04-09: qty 4

## 2020-04-09 MED ORDER — VERAPAMIL HCL 2.5 MG/ML IV SOLN
INTRAVENOUS | Status: DC | PRN
  Administered 2020-04-09: 10 mL via INTRA_ARTERIAL

## 2020-04-09 MED ORDER — NITROGLYCERIN 1 MG/10 ML FOR IR/CATH LAB
INTRA_ARTERIAL | Status: AC
  Filled 2020-04-09: qty 10

## 2020-04-09 MED ORDER — IOHEXOL 350 MG/ML SOLN
INTRAVENOUS | Status: AC
  Filled 2020-04-09: qty 1

## 2020-04-09 MED ORDER — AMLODIPINE BESYLATE 5 MG PO TABS
5.0000 mg | ORAL_TABLET | Freq: Every day | ORAL | Status: DC
  Administered 2020-04-09 – 2020-04-10 (×2): 5 mg via ORAL
  Filled 2020-04-09 (×2): qty 1

## 2020-04-09 MED ORDER — NITROGLYCERIN 1 MG/10 ML FOR IR/CATH LAB
INTRA_ARTERIAL | Status: DC | PRN
  Administered 2020-04-09 (×2): 200 ug via INTRACORONARY

## 2020-04-09 MED ORDER — QUETIAPINE FUMARATE 25 MG PO TABS
25.0000 mg | ORAL_TABLET | Freq: Every day | ORAL | Status: DC
  Administered 2020-04-09: 25 mg via ORAL
  Filled 2020-04-09: qty 1

## 2020-04-09 MED ORDER — SODIUM CHLORIDE 0.9% FLUSH: 3.0000 mL | INTRAVENOUS | Status: DC | PRN

## 2020-04-09 MED ORDER — HEPARIN (PORCINE) IN NACL 1000-0.9 UT/500ML-% IV SOLN
INTRAVENOUS | Status: AC
  Filled 2020-04-09: qty 1000

## 2020-04-09 MED ORDER — SODIUM CHLORIDE 0.9 % IV SOLN: 250.0000 mL | INTRAVENOUS | Status: DC | PRN

## 2020-04-09 MED ORDER — LABETALOL HCL 5 MG/ML IV SOLN: 10.0000 mg | INTRAVENOUS | Status: AC | PRN

## 2020-04-09 MED ORDER — LIDOCAINE HCL (PF) 1 % IJ SOLN
INTRAMUSCULAR | Status: DC | PRN
  Administered 2020-04-09: 2 mL

## 2020-04-09 MED ORDER — IOHEXOL 350 MG/ML SOLN
INTRAVENOUS | Status: DC | PRN
  Administered 2020-04-09: 145 mL

## 2020-04-09 MED ORDER — SODIUM CHLORIDE 0.9% FLUSH
3.0000 mL | Freq: Two times a day (BID) | INTRAVENOUS | Status: DC
  Administered 2020-04-09 – 2020-04-10 (×2): 3 mL via INTRAVENOUS

## 2020-04-09 SURGICAL SUPPLY — 18 items
BALLN SAPPHIRE 2.5X12 (BALLOONS) ×2
BALLN SAPPHIRE ~~LOC~~ 4.0X12 (BALLOONS) ×1 IMPLANT
BALLOON SAPPHIRE 2.5X12 (BALLOONS) IMPLANT
CATH DRAGONFLY OPSTAR (CATHETERS) ×1 IMPLANT
CATH INFINITI 5 FR IM (CATHETERS) ×1 IMPLANT
CATH INFINITI 5FR MULTPACK ANG (CATHETERS) ×1 IMPLANT
CATH LAUNCHER 6FR EBU 4 (CATHETERS) ×1 IMPLANT
DEVICE RAD COMP TR BAND LRG (VASCULAR PRODUCTS) ×1 IMPLANT
GLIDESHEATH SLEND SS 6F .021 (SHEATH) ×1 IMPLANT
GUIDEWIRE INQWIRE 1.5J.035X260 (WIRE) IMPLANT
INQWIRE 1.5J .035X260CM (WIRE) ×2
KIT ENCORE 26 ADVANTAGE (KITS) ×1 IMPLANT
KIT HEART LEFT (KITS) ×2 IMPLANT
PACK CARDIAC CATHETERIZATION (CUSTOM PROCEDURE TRAY) ×2 IMPLANT
STENT RESOLUTE ONYX 3.5X15 (Permanent Stent) ×1 IMPLANT
TRANSDUCER W/STOPCOCK (MISCELLANEOUS) ×2 IMPLANT
TUBING CIL FLEX 10 FLL-RA (TUBING) ×2 IMPLANT
WIRE RUNTHROUGH .014X180CM (WIRE) ×1 IMPLANT

## 2020-04-09 NOTE — Interval H&P Note (Signed)
History and Physical Interval Note:  04/09/2020 12:34 PM  Steven Osborne  has presented today for surgery, with the diagnosis of NSTEMI.  The various methods of treatment have been discussed with the patient and family. After consideration of risks, benefits and other options for treatment, the patient has consented to  Procedure(s): LEFT HEART CATH AND CORS/GRAFTS ANGIOGRAPHY (N/A) as a surgical intervention.  The patient's history has been reviewed, patient examined, no change in status, stable for surgery.  I have reviewed the patient's chart and labs.  Questions were answered to the patient's satisfaction.    Cath Lab Visit (complete for each Cath Lab visit)  Clinical Evaluation Leading to the Procedure:   ACS: Yes.    Non-ACS:  N/A  Shamikia Linskey

## 2020-04-09 NOTE — Progress Notes (Addendum)
 Progress Note  Patient Name: Steven Osborne Date of Encounter: 04/09/2020  [facility] Cardiologist: No primary care provider on file.   Subjective   Had a rough night. Got up to use the bathroom and had chest pain.   Inpatient Medications    Scheduled Meds:  aspirin EC  81 mg Oral Daily   atorvastatin  20 mg Oral q1800   carvedilol  25 mg Oral BID   clopidogrel  75 mg Oral Daily   hydrALAZINE  50 mg Oral Q8H   influenza vaccine adjuvanted  0.5 mL Intramuscular Tomorrow-1000   insulin aspart  0-5 Units Subcutaneous QHS   insulin aspart  0-9 Units Subcutaneous TID WC   isosorbide mononitrate  60 mg Oral Daily   omega-3 acid ethyl esters  1 g Oral Daily   pantoprazole  40 mg Oral BID AC   sodium chloride flush  3 mL Intravenous Q12H   sodium chloride flush  3 mL Intravenous Q12H   timolol  1 drop Right Eye BID   Continuous Infusions:  sodium chloride     sodium chloride 10 mL/hr at 04/08/20 1510   sodium chloride 100 mL/hr at 04/09/20 0542   heparin 1,350 Units/hr (04/08/20 1912)   PRN Meds: sodium chloride, sodium chloride, hydrALAZINE, sodium chloride flush   Vital Signs    Vitals:   04/08/20 2152 04/09/20 0450 04/09/20 0500 04/09/20 0809  BP: (!) 181/97 (!) 151/74  (!) 188/94  Pulse: 81 71  75  Resp: 19   20  Temp: 97.6 F (36.4 C) 98 F (36.7 C)  97.8 F (36.6 C)  TempSrc: Oral Axillary  Oral  SpO2: 96% 93%  96%  Weight:  92.7 kg 92.7 kg   Height:        Intake/Output Summary (Last 24 hours) at 04/09/2020 1032 Last data filed at 04/09/2020 0800 Gross per 24 hour  Intake 784.28 ml  Output 955 ml  Net -170.72 ml   Last 3 Weights 04/09/2020 04/09/2020 04/07/2020  Weight (lbs) 204 lb 6.4 oz 204 lb 4.8 oz 206 lb  Weight (kg) 92.715 kg 92.67 kg 93.441 kg      Telemetry    SR - Personally Reviewed  ECG    SR 74 bpm, RBBB, LAFB with nonspecific changes - Personally Reviewed  Physical Exam   GEN: No acute distress.   Neck: No JVD Cardiac: RRR, no  murmurs, rubs, or gallops.  Respiratory: Clear to auscultation bilaterally. GI: Soft, nontender, non-distended  MS: No edema; No deformity. Neuro:  Nonfocal  Psych: Normal affect   Labs    High Sensitivity Troponin:   Recent Labs  Lab 04/08/20 0000 04/08/20 0253  TROPONINIHS 288* 286*      Chemistry Recent Labs  Lab 04/08/20 0000 04/08/20 1047 04/09/20 0210  NA 136 136 135  K 3.8 3.7 3.9  CL 106 104 106  CO2 23 22 19*  GLUCOSE 100* 143* 200*  BUN 27* 25* 21  CREATININE 1.79* 1.42* 1.49*  CALCIUM 8.8* 8.9 8.8*  PROT  --   --  6.3*  ALBUMIN  --   --  3.2*  AST  --   --  17  ALT  --   --  15  ALKPHOS  --   --  65  BILITOT  --   --  1.1  GFRNONAA 38* 51* 48*  ANIONGAP 7 10 10     Hematology Recent Labs  Lab 04/08/20 0000 04/09/20 0210  WBC 7.4 8.1    RBC 4.85 4.59  HGB 14.3 13.6  HCT 41.9 38.7*  MCV 86.4 84.3  MCH 29.5 29.6  MCHC 34.1 35.1  RDW 13.8 13.7  PLT 159 145*    BNP Recent Labs  Lab 04/08/20 0000  BNP 265.0*     DDimer No results for input(s): DDIMER in the last 168 hours.   Radiology    DG Chest Portable 1 View  Result Date: 04/08/2020 CLINICAL DATA:  Shortness of breath and chest pain. EXAM: PORTABLE CHEST 1 VIEW COMPARISON:  September 24, 2017 FINDINGS: Multiple sternal wires and vascular clips are seen. Diffuse chronic appearing increased interstitial lung markings are noted. There is no evidence of acute infiltrate, pleural effusion or pneumothorax. There is moderate severity enlargement of the cardiac silhouette. The visualized skeletal structures are unremarkable. IMPRESSION: Stable cardiomegaly with evidence of prior median sternotomy/CABG. Electronically Signed   By: Aram Candela M.D.   On: 04/08/2020 00:18    Cardiac Studies   Echo: pending  Patient Profile     80 y.o. male with a past medical history of CAD (s/p CABG in 2005, cath in 12/2014 showing patent LIMA-LAD, patent native RCA and LCx with occluded SVG-RCA, occluded  SVG-OM and occluded SVG-Diagonal with medical management recommended given collateral flow), chronic combined systolic and diastolic CHF/Ischemic Cardiomyopathy (EF previously 30-35%, at 40-45% by echo in 04/2018), HTN, HLD and Type 2 DM who was seen the evaluation of NSTEMI at the request of Dr. Thomes Dinning.  Assessment & Plan    1. NSTEMI: He presented with a 3-4 week history of progressive dyspnea on exertion and chest pain on exertion which resolves with rest and most concerning for unstable angina. Had a rough night with ongoing chest pain this morning rates 5/10. Will add nitro paste this morning. Will try to move him up for sooner cath this morning.  -- Initial HS Troponin 288 with repeat of 286. EKG showed NSR, HR 79 with known RBBB -- he was transferred [facility] with plans for cardiac cath today  -- Continue Heparin along with ASA, Plavix, Lipitor, Imdur and Coreg   2. CAD: He is s/p CABG in 2005 with most recent cath in 12/2014 showing patent LIMA-LAD, patent native RCA and LCx with occluded SVG-RCA, occluded SVG-OM and occluded SVG-Diagonal with medical management recommended given collateral flow. -- Continue PTA ASA 81mg  daily, Atorvastatin 20mg  daily, Plavix 75mg  daily, Coreg 25mg  BID and Imdur 30mg  daily -- planned for cath today   3. Chronic Combined Systolic and Diastolic CHF/Ischemic Cardiomyopathy:  His EF was previously 30-35%, at 40-45% by echo in 04/2018. He denies any recent orthopnea, PNF or edema. BNP was 265 on admission.  -- Home lasix has been held and started on gentle IVFs for cath -- repeat echo is pending   4. Hypertensive Urgency: blood pressures remain elevated despite current medications on review, but improve when reassessed in the room.  -- continue coreg 25mg  BID, hydralazine 50mg  TID.    5. Type 2 DM: Hgb A1c 7.8 -- on SSI -- home metformin held   6. Stage 3 CKD: Creatinine was previously at 1.30 in 04/2018 but no recent vales for comparison. At 1.79 on  admission. Has been on IVFs with improvement to 1.4 this morning -- daily BMET  7. HLD: LDL 183 -- has been on atorvastatin 20mg  daily as he struggles with statin intolerance -- continue Lovaza 1g daily -- discussed PCSK9, will plan for referral at discharge  For questions or updates, please contact [facility] Please consult [email or web address] for contact info under        Signed, Laverda Page, NP  04/09/2020, 10:32 AM    Agree with note by Laverda Page NP-C  Patient with known CAD status post remote CABG in 2005 with cath documented occluded veins and patent LIMA.  He has LV dysfunction as well.  He was admitted with chest pain and positive enzymes.  He currently is on IV heparin.  Other problems as outlined.  Plan diagnostic coronary angiography today.  Runell Gess, M.D., FACP, [facility], [facility] [address] [place], [place]  [address]  [phone] 04/09/2020 11:58 AM

## 2020-04-09 NOTE — Progress Notes (Signed)
  Echocardiogram 2D Echocardiogram has been performed.  Delcie Roch 04/09/2020, 9:53 AM

## 2020-04-09 NOTE — Progress Notes (Signed)
ANTICOAGULATION CONSULT NOTE  - Follow Up Consult  Pharmacy Consult for IV Heparin Indication: chest pain/ACS  Allergies  Allergen Reactions  . Crestor [Rosuvastatin Calcium]     myalgia    Patient Measurements: Height: 5\' 10"  (177.8 cm) Weight: 93.4 kg (206 lb) IBW/kg (Calculated) : 73 Heparin Dosing Weight: 90 kg  Vital Signs: Temp: 97.6 F (36.4 C) (02/16 2152) Temp Source: Oral (02/16 2152) BP: 181/97 (02/16 2152) Pulse Rate: 81 (02/16 2152)  Labs: Recent Labs    04/08/20 0000 04/08/20 0253 04/08/20 0743 04/08/20 1047 04/08/20 1612 04/09/20 0210  HGB 14.3  --   --   --   --  13.6  HCT 41.9  --   --   --   --  38.7*  PLT 159  --   --   --   --  145*  HEPARINUNFRC  --   --  0.32  --  0.19* 0.34  CREATININE 1.79*  --   --  1.42*  --   --   TROPONINIHS 288* 286*  --   --   --   --     Estimated Creatinine Clearance: 49.2 mL/min (A) (by C-G formula based on SCr of 1.42 mg/dL (H)).   Assessment: 80 y.o. male with hx of CAD with worsening CP and DOE, elevated troponins presented to [facility]. Pt was started on IV heparin and transferred to [facility] this afternoon for cardiac cath 2/17.   Heparin level therapeutic (0.34) on gtt at 1350 units/hr. No bleeding noted.  Goal of Therapy:  Heparin level 0.3-0.7 units/ml Monitor platelets by anticoagulation protocol: Yes   Plan:  Continue heparin infusion at 1350 units/hr F/u post cath  3/17, PharmD, BCPS Please see amion for complete clinical pharmacist phone list 04/09/2020,3:31 AM

## 2020-04-09 NOTE — H&P (View-Only) (Signed)
Progress Note  Patient Name: Steven Osborne Date of Encounter: 04/09/2020  [facility] Cardiologist: No primary care provider on file.   Subjective   Had a rough night. Got up to use the bathroom and had chest pain.   Inpatient Medications    Scheduled Meds:  aspirin EC  81 mg Oral Daily   atorvastatin  20 mg Oral q1800   carvedilol  25 mg Oral BID   clopidogrel  75 mg Oral Daily   hydrALAZINE  50 mg Oral Q8H   influenza vaccine adjuvanted  0.5 mL Intramuscular Tomorrow-1000   insulin aspart  0-5 Units Subcutaneous QHS   insulin aspart  0-9 Units Subcutaneous TID WC   isosorbide mononitrate  60 mg Oral Daily   omega-3 acid ethyl esters  1 g Oral Daily   pantoprazole  40 mg Oral BID AC   sodium chloride flush  3 mL Intravenous Q12H   sodium chloride flush  3 mL Intravenous Q12H   timolol  1 drop Right Eye BID   Continuous Infusions:  sodium chloride     sodium chloride 10 mL/hr at 04/08/20 1510   sodium chloride 100 mL/hr at 04/09/20 0542   heparin 1,350 Units/hr (04/08/20 1912)   PRN Meds: sodium chloride, sodium chloride, hydrALAZINE, sodium chloride flush   Vital Signs    Vitals:   04/08/20 2152 04/09/20 0450 04/09/20 0500 04/09/20 0809  BP: (!) 181/97 (!) 151/74  (!) 188/94  Pulse: 81 71  75  Resp: 19   20  Temp: 97.6 F (36.4 C) 98 F (36.7 C)  97.8 F (36.6 C)  TempSrc: Oral Axillary  Oral  SpO2: 96% 93%  96%  Weight:  92.7 kg 92.7 kg   Height:        Intake/Output Summary (Last 24 hours) at 04/09/2020 1032 Last data filed at 04/09/2020 0800 Gross per 24 hour  Intake 784.28 ml  Output 955 ml  Net -170.72 ml   Last 3 Weights 04/09/2020 04/09/2020 04/07/2020  Weight (lbs) 204 lb 6.4 oz 204 lb 4.8 oz 206 lb  Weight (kg) 92.715 kg 92.67 kg 93.441 kg      Telemetry    SR - Personally Reviewed  ECG    SR 74 bpm, RBBB, LAFB with nonspecific changes - Personally Reviewed  Physical Exam   GEN: No acute distress.   Neck: No JVD Cardiac: RRR, no  murmurs, rubs, or gallops.  Respiratory: Clear to auscultation bilaterally. GI: Soft, nontender, non-distended  MS: No edema; No deformity. Neuro:  Nonfocal  Psych: Normal affect   Labs    High Sensitivity Troponin:   Recent Labs  Lab 04/08/20 0000 04/08/20 0253  TROPONINIHS 288* 286*      Chemistry Recent Labs  Lab 04/08/20 0000 04/08/20 1047 04/09/20 0210  NA 136 136 135  K 3.8 3.7 3.9  CL 106 104 106  CO2 23 22 19*  GLUCOSE 100* 143* 200*  BUN 27* 25* 21  CREATININE 1.79* 1.42* 1.49*  CALCIUM 8.8* 8.9 8.8*  PROT  --   --  6.3*  ALBUMIN  --   --  3.2*  AST  --   --  17  ALT  --   --  15  ALKPHOS  --   --  65  BILITOT  --   --  1.1  GFRNONAA 38* 51* 48*  ANIONGAP 7 10 10      Hematology Recent Labs  Lab 04/08/20 0000 04/09/20 0210  WBC 7.4 8.1  RBC 4.85 4.59  HGB 14.3 13.6  HCT 41.9 38.7*  MCV 86.4 84.3  MCH 29.5 29.6  MCHC 34.1 35.1  RDW 13.8 13.7  PLT 159 145*    BNP Recent Labs  Lab 04/08/20 0000  BNP 265.0*     DDimer No results for input(s): DDIMER in the last 168 hours.   Radiology    DG Chest Portable 1 View  Result Date: 04/08/2020 CLINICAL DATA:  Shortness of breath and chest pain. EXAM: PORTABLE CHEST 1 VIEW COMPARISON:  September 24, 2017 FINDINGS: Multiple sternal wires and vascular clips are seen. Diffuse chronic appearing increased interstitial lung markings are noted. There is no evidence of acute infiltrate, pleural effusion or pneumothorax. There is moderate severity enlargement of the cardiac silhouette. The visualized skeletal structures are unremarkable. IMPRESSION: Stable cardiomegaly with evidence of prior median sternotomy/CABG. Electronically Signed   By: Aram Candela M.D.   On: 04/08/2020 00:18    Cardiac Studies   Echo: pending  Patient Profile     80 y.o. male with a past medical history of CAD (s/p CABG in 2005, cath in 12/2014 showing patent LIMA-LAD, patent native RCA and LCx with occluded SVG-RCA, occluded  SVG-OM and occluded SVG-Diagonal with medical management recommended given collateral flow), chronic combined systolic and diastolic CHF/Ischemic Cardiomyopathy (EF previously 30-35%, at 40-45% by echo in 04/2018), HTN, HLD and Type 2 DM who was seen the evaluation of NSTEMI at the request of Dr. Thomes Dinning.  Assessment & Plan    1. NSTEMI: He presented with a 3-4 week history of progressive dyspnea on exertion and chest pain on exertion which resolves with rest and most concerning for unstable angina. Had a rough night with ongoing chest pain this morning rates 5/10. Will add nitro paste this morning. Will try to move him up for sooner cath this morning.  -- Initial HS Troponin 288 with repeat of 286. EKG showed NSR, HR 79 with known RBBB -- he was transferred from [facility] with plans for cardiac cath today  -- Continue Heparin along with ASA, Plavix, Lipitor, Imdur and Coreg   2. CAD: He is s/p CABG in 2005 with most recent cath in 12/2014 showing patent LIMA-LAD, patent native RCA and LCx with occluded SVG-RCA, occluded SVG-OM and occluded SVG-Diagonal with medical management recommended given collateral flow. -- Continue PTA ASA 81mg  daily, Atorvastatin 20mg  daily, Plavix 75mg  daily, Coreg 25mg  BID and Imdur 30mg  daily -- planned for cath today   3. Chronic Combined Systolic and Diastolic CHF/Ischemic Cardiomyopathy:  His EF was previously 30-35%, at 40-45% by echo in 04/2018. He denies any recent orthopnea, PNF or edema. BNP was 265 on admission.  -- Home lasix has been held and started on gentle IVFs for cath -- repeat echo is pending   4. Hypertensive Urgency: blood pressures remain elevated despite current medications on review, but improve when reassessed in the room.  -- continue coreg 25mg  BID, hydralazine 50mg  TID.    5. Type 2 DM: Hgb A1c 7.8 -- on SSI -- home metformin held   6. Stage 3 CKD: Creatinine was previously at 1.30 in 04/2018 but no recent vales for comparison. At 1.79 on  admission. Has been on IVFs with improvement to 1.4 this morning -- daily BMET  7. HLD: LDL 183 -- has been on atorvastatin 20mg  daily as he struggles with statin intolerance -- continue Lovaza 1g daily -- discussed PCSK9, will plan for referral at discharge  For questions or updates, please contact [facility] Please consult [email or web address] for contact info under        Signed, Laverda Page, NP  04/09/2020, 10:32 AM    Agree with note by Laverda Page NP-C  Patient with known CAD status post remote CABG in 2005 with cath documented occluded veins and patent LIMA.  He has LV dysfunction as well.  He was admitted with chest pain and positive enzymes.  He currently is on IV heparin.  Other problems as outlined.  Plan diagnostic coronary angiography today.  Runell Gess, M.D., FACP, [facility], [facility] [address] [place], [place]  [address]  [phone] 04/09/2020 11:58 AM

## 2020-04-10 ENCOUNTER — Other Ambulatory Visit: Payer: Self-pay | Admitting: Cardiology

## 2020-04-10 ENCOUNTER — Encounter (HOSPITAL_COMMUNITY): Payer: Self-pay | Admitting: Internal Medicine

## 2020-04-10 DIAGNOSIS — I209 Angina pectoris, unspecified: Secondary | ICD-10-CM | POA: Diagnosis not present

## 2020-04-10 DIAGNOSIS — I214 Non-ST elevation (NSTEMI) myocardial infarction: Secondary | ICD-10-CM | POA: Diagnosis not present

## 2020-04-10 DIAGNOSIS — I10 Essential (primary) hypertension: Secondary | ICD-10-CM | POA: Diagnosis not present

## 2020-04-10 DIAGNOSIS — E782 Mixed hyperlipidemia: Secondary | ICD-10-CM | POA: Diagnosis not present

## 2020-04-10 LAB — BASIC METABOLIC PANEL
Anion gap: 9 (ref 5–15)
BUN: 17 mg/dL (ref 8–23)
CO2: 20 mmol/L — ABNORMAL LOW (ref 22–32)
Calcium: 8.7 mg/dL — ABNORMAL LOW (ref 8.9–10.3)
Chloride: 108 mmol/L (ref 98–111)
Creatinine, Ser: 1.48 mg/dL — ABNORMAL HIGH (ref 0.61–1.24)
GFR, Estimated: 48 mL/min — ABNORMAL LOW (ref >60)
Glucose, Bld: 231 mg/dL — ABNORMAL HIGH (ref 70–99)
Potassium: 3.7 mmol/L (ref 3.5–5.1)
Sodium: 137 mmol/L (ref 135–145)

## 2020-04-10 LAB — CBC
HCT: 37.4 % — ABNORMAL LOW (ref 39.0–52.0)
Hemoglobin: 12.6 g/dL — ABNORMAL LOW (ref 13.0–17.0)
MCH: 28.8 pg (ref 26.0–34.0)
MCHC: 33.7 g/dL (ref 30.0–36.0)
MCV: 85.6 fL (ref 80.0–100.0)
Platelets: 131 10*3/uL — ABNORMAL LOW (ref 150–400)
RBC: 4.37 MIL/uL (ref 4.22–5.81)
RDW: 13.6 % (ref 11.5–15.5)
WBC: 8.1 10*3/uL (ref 4.0–10.5)
nRBC: 0 % (ref 0.0–0.2)

## 2020-04-10 LAB — GLUCOSE, CAPILLARY
Glucose-Capillary: 254 mg/dL — ABNORMAL HIGH (ref 70–99)
Glucose-Capillary: 317 mg/dL — ABNORMAL HIGH (ref 70–99)

## 2020-04-10 MED ORDER — FUROSEMIDE 10 MG/ML IJ SOLN
40.0000 mg | Freq: Once | INTRAMUSCULAR | Status: AC
  Administered 2020-04-10: 40 mg via INTRAVENOUS
  Filled 2020-04-10: qty 4

## 2020-04-10 MED ORDER — METFORMIN HCL 500 MG PO TABS: 500.0000 mg | ORAL_TABLET | Freq: Every day | ORAL | Status: DC

## 2020-04-10 MED ORDER — AMLODIPINE BESYLATE 10 MG PO TABS: 10.0000 mg | ORAL_TABLET | Freq: Every day | ORAL | Status: DC

## 2020-04-10 MED ORDER — AMLODIPINE BESYLATE 10 MG PO TABS: 10.0000 mg | ORAL_TABLET | Freq: Every day | ORAL | 11 refills | Status: DC

## 2020-04-10 MED ORDER — HYDRALAZINE HCL 50 MG PO TABS
50.0000 mg | ORAL_TABLET | Freq: Three times a day (TID) | ORAL | Status: DC
  Administered 2020-04-10: 50 mg via ORAL
  Filled 2020-04-10: qty 1

## 2020-04-10 MED ORDER — ISOSORBIDE MONONITRATE ER 30 MG PO TB24
30.0000 mg | ORAL_TABLET | Freq: Every day | ORAL | Status: DC
  Administered 2020-04-10: 30 mg via ORAL
  Filled 2020-04-10: qty 1

## 2020-04-10 MED ORDER — HYDRALAZINE HCL 50 MG PO TABS: 50.0000 mg | ORAL_TABLET | Freq: Three times a day (TID) | ORAL | Status: DC

## 2020-04-10 MED ORDER — HYDRALAZINE HCL 50 MG PO TABS: 75.0000 mg | ORAL_TABLET | Freq: Three times a day (TID) | ORAL | Status: DC

## 2020-04-10 MED ORDER — CLOPIDOGREL BISULFATE 75 MG PO TABS: 75.0000 mg | ORAL_TABLET | Freq: Every day | ORAL | 2 refills | Status: DC

## 2020-04-10 MED FILL — AMLODIPINE BESYLATE 10 MG T: 10 | 30 days supply | Qty: 30 | Fill #0

## 2020-04-10 MED FILL — CLOPIDOGREL 75 MG TABLET: 75 | 30 days supply | Qty: 30 | Fill #0

## 2020-04-10 NOTE — Progress Notes (Signed)
Inpatient Diabetes Program Recommendations  AACE/ADA: New Consensus Statement on Inpatient Glycemic Control (2015)  Target Ranges:  Prepandial:   less than 140 mg/dL      Peak postprandial:   less than 180 mg/dL (1-2 hours)      Critically ill patients:  140 - 180 mg/dL   Lab Results  Component Value Date   GLUCAP 254 (H) 04/10/2020   HGBA1C 7.8 (H) 04/08/2020   Results for KONNOR, VONDRASEK (MRN [id number]) as of 04/10/2020 11:16  Ref. Range 04/08/2020 21:51 04/09/2020 08:08 04/09/2020 17:36 04/09/2020 21:03 04/10/2020 07:50  Glucose-Capillary Latest Ref Range: 70 - 99 mg/dL 277 (H) 824 (H) 235 (H) 231 (H) 254 (H)   Review of Glycemic Control  Diabetes history: type 2 Outpatient Diabetes medications: NPH 20 units BID Current orders for Inpatient glycemic control: Novolog SENSITIVE correction scale TID and HS scale (0-5 units)  Inpatient Diabetes Program Recommendations:   Noted that patient's blood sugars have been greater than 180 mg/dl. Recommend adding NPH 10 units BID if blood sugars continue to be elevated.   Smith Mince RN BSN CDE Diabetes Coordinator Pager: [phone]  8am-5pm

## 2020-04-10 NOTE — Progress Notes (Addendum)
CARDIAC REHAB PHASE I   PRE:  Rate/Rhythm: 73 SR    BP: sitting 144/77    SaO2:   MODE:  Ambulation: 240 ft   POST:  Rate/Rhythm: 84 SR    BP: sitting 162/88     SaO2: 93 RA  Pt c/o feeling generally weaker than his norm but no c/o CP. Slight SOB toward end of walk. To recliner. Discussed MI, stent, restrictions, Plavix, diet, exercise, NTG and CRPII with pt and his caregiver Inetta Fermo. Receptive. Not interested in [facility] however referral placed for [facility].  He needs prescription for NTG as he did not have PTA.  [phone]  Harriet Masson CES, ACSM 04/10/2020 12:25 PM

## 2020-04-10 NOTE — Care Management (Signed)
04-10-20 1300 Patient wanted information on a new primary care provider. Case Manager was able to speak with patient and provided him with the [facility] Number to call to get a provider in network. Gala Lewandowsky, RN,BSN Case Manager

## 2020-04-10 NOTE — Discharge Summary (Signed)
Discharge Summary    Patient ID: Steven Osborne MRN: [id number]; DOB: 04/20/1940  Admit date: 04/07/2020 Discharge date: 04/10/2020  PCP:  Jason Coop, FNP   [facility]  Cardiologist: Dr. Eden Emms, MD  Discharge Diagnoses    Principal Problem:   Ischemic chest pain [facility]) Active Problems:   Hyperlipidemia   Essential hypertension   CAD (coronary artery disease)   Diabetes (HCC)   CKD stage 3 due to type 2 diabetes mellitus (HCC)   NSTEMI (non-ST elevated myocardial infarction) (HCC)   Prolonged QT interval   AKI (acute kidney injury) (HCC)   GERD (gastroesophageal reflux disease)   Elevated troponin   Elevated brain natriuretic peptide (BNP) level   Hypertensive urgency  Diagnostic Studies/Procedures    LHC 04/09/20:  1. Left ventricular ejection fraction, by estimation, is 45 to 50%. The  left ventricle has mildly decreased function. The left ventricle  demonstrates regional wall motion abnormalities (see scoring  diagram/findings for description). There is mild  concentric left ventricular hypertrophy. Left ventricular diastolic  parameters are consistent with Grade I diastolic dysfunction (impaired  relaxation).  2. Right ventricular systolic function is mildly reduced. The right  ventricular size is normal. There is normal pulmonary artery systolic  pressure.  3. Left atrial size was moderately dilated.  4. Right atrial size was mildly dilated.  5. The mitral valve is normal in structure. Trivial mitral valve  regurgitation. No evidence of mitral stenosis.  6. The aortic valve is tricuspid. Aortic valve regurgitation is not  visualized. No aortic stenosis is present.  7. Aortic dilatation noted. There is mild dilatation of the ascending  aorta, measuring 40 mm.  8. The inferior vena cava is normal in size with greater than 50%  respiratory variability, suggesting right atrial pressure of 3 mmHg.    Conclusions: 1. Significant multivessel coronary artery disease, including CTO's of mid LAD, ostial D1, and proximal OM1, as well as 95% OM2 stenosis (large vessel), 60% mid RCA disease, and 80-90% RPAV disease. 2. Widely patent LIMA-LAD. 3. Chronically occluded SVG-D1, SVG-OM1, and SVG-rPDA. 4. Moderately elevated left ventricular filling pressure (LVEDP 25-30 mmHg). 5. Successful OCT-guided PCI to OM2 using Resolute Onyx 3.5 x 15 mm drug-eluting stent (postdilated to 4.1 mm) with 0% residual stenosis and TIMI-3 flow.  Recommendations: 1. Continue indefinite DAPT with aspirin and clopidogrel. 2. Aggressive secondary prevention. 3. Consider gentle diuresis beginning tomorrow (sooner if patient develops heart failure symptoms), given moderately elevated LVEDP in the setting of CKD. 4. Favor medical therapy of RCA/RPAV disease unless patient has refractory angina.  Echo 04/09/20:  1. Left ventricular ejection fraction, by estimation, is 45 to 50%. The  left ventricle has mildly decreased function. The left ventricle  demonstrates regional wall motion abnormalities (see scoring  diagram/findings for description). There is mild  concentric left ventricular hypertrophy. Left ventricular diastolic  parameters are consistent with Grade I diastolic dysfunction (impaired  relaxation).  2. Right ventricular systolic function is mildly reduced. The right  ventricular size is normal. There is normal pulmonary artery systolic  pressure.  3. Left atrial size was moderately dilated.  4. Right atrial size was mildly dilated.  5. The mitral valve is normal in structure. Trivial mitral valve  regurgitation. No evidence of mitral stenosis.  6. The aortic valve is tricuspid. Aortic valve regurgitation is not  visualized. No aortic stenosis is present.  7. Aortic dilatation noted. There is mild dilatation of the ascending  aorta, measuring 40 mm.  8. The inferior vena cava is normal in size  with greater than 50%  respiratory variability, suggesting right atrial pressure of 3 mmHg.  _____________   History of Present Illness     Steven Osborne is a 80 y.o. male with a hx of hypertension, hyperlipidemia, CAD s/p CABG x4, DM2, and GERD who presented [facility] 04/07/20 with shortness of breath and chest pain. He reported a 2-3 week history of shortness of breath on exertion which was associated with midsternal chest pain. Chest pain and shortness of breath resolves within few minutes of rest. Symptoms continued to recur with exertion therefore he presented to the ED for further evaluation. He is followed by [facility] for his cardiology care.   Hospital Course     In the ED, BP was 145/80 and this subsequently increased to 189/103 while in the ED. Work-up in the ED showed normal CBC, BUN to creatinine 27/1.79 with most recent labs for comparison was 2 years ago with creatinine of 1.2-1.3. BNP was 265 with a HsT at 288 > 286. Chest x-ray was stable with  cardiomegaly and evidence of prior median sternotomy/CABG. He was started on IV nitroglycerin drip. Cardiology on call was consulted by ED physician with plan to transfer to [facility] for further evaluation with LHC.   Hospital concerns include:  NSTEMI with history of CAD s/p CABG 2005: -Patient underwent CABG in 2005 with LHC in 2016 showing patent LIMA to LAD, patent native RCA and LCx with occluded SVG to RCA, occluded SVG to OM and occluded SVG to diagonal with recommendations for medical management given collateral flow. -Patient presented with a 3 to 4-week history of progressive dyspnea and chest pain found to have an initial high-sensitivity troponin at 288.  He was seen by the EP team and referred to [facility] for cardiac catheterization -LHC performed 04/09/2020 which showed significant multivessel CAD including CTO's of mid LAD, ostial D1 and proximal OM1 as well as a 95% OM 2 stenosis, 60% mid RCA and 80 to 90% RPAV disease with chronically occluded  SVG to D1, SVG to OM1 and SVG to RPDA.  LVEDP elevated during cath with successful OCT guided PCI to OM2 performed. Plans for indefinite DAPT with aspirin and Plavix with aggressive secondary prevention.  Plan was to gently diurese overnight given elevated LVEDP.  Favor medical management of the RCA/RPAV unless refractory angina per cath note. -Continue ASA, Plavix, carvedilol, statin>>struggles with statin intolerance.   Chronic combined systolic and diastolic CHF with ischemic cardiomyopathy: -Previous LVEF at 30 to 35% with repeat echocardiogram in 2020 showing mild improvement to 40 to 45% -BNP mildly elevated on presentation at 265 -On PTA Lasix>>will give one IV dose of Lasix now and restart PO Lasix  PO QD -Cr today at baseline>>>needs f/u BMET  -Repeat echocardiogram this admission the EF at 45-50% with concentric LVH and G1DD  -No ACE/ARB due to renal dysfunction   Hypertensive urgency: -Markedly elevated on hospital presentation however stable today -Continue carvedilol 25 twice daily, hydralazine 50 TID -He was started on amlodipine during his hospitalization. His PTA hydralazine was previously at 25 TID.  -Add PTA Imdur 30 back to regimen   DM2: -Hemoglobin A1c, 7.8 -On SSI for glucose control inpatient status -On PTA Metformin, held during hospitalization with plan to restart 04/11/2020  HLD: -LDL, 193 -Struggles with statin intolerance -Continued on Lovaza 1 g daily -Plan for lipid clinic referral for PCSK9 inhibitor discharge  CKD stage III: -Baseline creatinine appears to 1.3-1.4 range from  04/2018 -Creatinine today, 1.48 -Continue to avoid nephrotoxic medications -Plan to repeat labs at outpatient follow-up -Attempt to add Jardiance   Aortic dilatation: -Measuring 40mm on echo>>>follow with surveillance studies.  -Needs better BP control  -Med adjustments as above   Consultants: None   The patient was seen and examined by Dr. Allyson Sabal who feels that  the patient is stable and ready for discharge today, 04/10/20. Cath site is stable. He has ambulated with cardiac rehabilitation without angina.    Did the patient have an acute coronary syndrome (MI, NSTEMI, STEMI, etc) this admission?:  Yes                               AHA/ACC Clinical Performance & Quality Measures: 1. Aspirin prescribed? - Yes 2. ADP Receptor Inhibitor (Plavix/Clopidogrel, Brilinta/Ticagrelor or Effient/Prasugrel) prescribed (includes medically managed patients)? - Yes 3. Beta Blocker prescribed? - Yes 4. High Intensity Statin (Lipitor 40-80mg  or Crestor 20-40mg ) prescribed? - No - intolerant  5. EF assessed during THIS hospitalization? - Yes 6. For EF <40%, was ACEI/ARB prescribed? - No - Reason:  renal dysfunction  7. For EF <40%, Aldosterone Antagonist (Spironolactone or Eplerenone) prescribed? - No - Reason:  renal dysfunction  8. Cardiac Rehab Phase II ordered (including medically managed patients)? - Yes      Discharge Vitals Blood pressure (!) 160/93, pulse 74, temperature 97.8 F (36.6 C), temperature source Oral, resp. rate 15, height  (1.778 m), weight 90.2 kg, SpO2 98 %.  Filed Weights   04/09/20 0450 04/09/20 0500 04/10/20 0500  Weight: 92.7 kg 92.7 kg 90.2 kg    Labs & Radiologic Studies    CBC Recent Labs    04/08/20 0000 04/09/20 0210 04/10/20 0151  WBC 7.4 8.1 8.1  NEUTROABS 4.3  --   --   HGB 14.3 13.6 12.6*  HCT 41.9 38.7* 37.4*  MCV 86.4 84.3 85.6  PLT 159 145* 131*   Basic Metabolic Panel Recent Labs    21/30/86 0253 04/08/20 1047 04/09/20 0210 04/10/20 0151  NA  --    < > 135 137  K  --    < > 3.9 3.7  CL  --    < > 106 108  CO2  --    < > 19* 20*  GLUCOSE  --    < > 200* 231*  BUN  --    < > 21 17  CREATININE  --    < > 1.49* 1.48*  CALCIUM  --    < > 8.8* 8.7*  MG 1.8  --   --   --   PHOS 2.4*  --   --   --    < > = values in this interval not displayed.   Liver Function Tests Recent Labs    04/09/20 0210   AST 17  ALT 15  ALKPHOS 65  BILITOT 1.1  PROT 6.3*  ALBUMIN 3.2*   No results for input(s): LIPASE, AMYLASE in the last 72 hours. High Sensitivity Troponin:   Recent Labs  Lab 04/08/20 0000 04/08/20 0253  TROPONINIHS 288* 286*    BNP Invalid input(s): POCBNP D-Dimer No results for input(s): DDIMER in the last 72 hours. Hemoglobin A1C Recent Labs    04/08/20 0000  HGBA1C 7.8*   Fasting Lipid Panel Recent Labs    04/09/20 0210  CHOL 257*  HDL 30*  LDLCALC 183*  TRIG 222*  CHOLHDL 8.6  Thyroid Function Tests No results for input(s): TSH, T4TOTAL, T3FREE, THYROIDAB in the last 72 hours.  Invalid input(s): FREET3 _____________  CARDIAC CATHETERIZATION  Result Date: 04/10/2020 Conclusions: 1. Significant multivessel coronary artery disease, including CTO's of mid LAD, ostial D1, and proximal OM1, as well as 95% OM2 stenosis (large vessel), 60% mid RCA disease, and 80-90% RPAV disease. 2. Widely patent LIMA-LAD. 3. Chronically occluded SVG-D1, SVG-OM1, and SVG-rPDA. 4. Moderately elevated left ventricular filling pressure (LVEDP 25-30 mmHg). 5. Successful OCT-guided PCI to OM2 using Resolute Onyx 3.5 x 15 mm drug-eluting stent (postdilated to 4.1 mm) with 0% residual stenosis and TIMI-3 flow. Recommendations: 1. Continue indefinite DAPT with aspirin and clopidogrel. 2. Aggressive secondary prevention. 3. Consider gentle diuresis beginning tomorrow (sooner if patient develops heart failure symptoms), given moderately elevated LVEDP in the setting of CKD. 4. Favor medical therapy of RCA/RPAV disease unless patient has refractory angina. Yvonne Kendall, MD [facility]   DG Chest Portable 1 View  Result Date: 04/08/2020 CLINICAL DATA:  Shortness of breath and chest pain. EXAM: PORTABLE CHEST 1 VIEW COMPARISON:  September 24, 2017 FINDINGS: Multiple sternal wires and vascular clips are seen. Diffuse chronic appearing increased interstitial lung markings are noted. There is no  evidence of acute infiltrate, pleural effusion or pneumothorax. There is moderate severity enlargement of the cardiac silhouette. The visualized skeletal structures are unremarkable. IMPRESSION: Stable cardiomegaly with evidence of prior median sternotomy/CABG. Electronically Signed   By: Aram Candela M.D.   On: 04/08/2020 00:18   ECHOCARDIOGRAM COMPLETE  Result Date: 04/09/2020    ECHOCARDIOGRAM REPORT   Patient Name:   Steven Osborne Date of Exam: 04/09/2020 Medical Rec #:  [id number]   Height:       70.0 in Accession #:    [id number]  Weight:       204.4 lb Date of Birth:  07-10-1940   BSA:          2.107 m Patient Age:    79 years    BP:           188/94 mmHg Patient Gender: M           HR:           69 bpm. Exam Location:  Inpatient Procedure: 2D Echo and Intracardiac Opacification Agent Indications:    chest pain  History:        Patient has prior history of Echocardiogram examinations, most                 recent 09/26/2017. Prior CABG, chronic kidney disease; Risk                 Factors:Dyslipidemia.  Sonographer:    Delcie Roch Referring Phys: [id number] OLADAPO ADEFESO IMPRESSIONS  1. Left ventricular ejection fraction, by estimation, is 45 to 50%. The left ventricle has mildly decreased function. The left ventricle demonstrates regional wall motion abnormalities (see scoring diagram/findings for description). There is mild concentric left ventricular hypertrophy. Left ventricular diastolic parameters are consistent with Grade I diastolic dysfunction (impaired relaxation).  2. Right ventricular systolic function is mildly reduced. The right ventricular size is normal. There is normal pulmonary artery systolic pressure.  3. Left atrial size was moderately dilated.  4. Right atrial size was mildly dilated.  5. The mitral valve is normal in structure. Trivial mitral valve regurgitation. No evidence of mitral stenosis.  6. The aortic valve is tricuspid. Aortic valve regurgitation is not visualized. No  aortic stenosis is present.  7.  Aortic dilatation noted. There is mild dilatation of the ascending aorta, measuring 40 mm.  8. The inferior vena cava is normal in size with greater than 50% respiratory variability, suggesting right atrial pressure of 3 mmHg. Comparison(s): Changes from prior study are noted. Conclusion(s)/Recommendation(s): Hypokinesis of the entire anterior wall and mid to distal anterolateral wall. Mid anteroseptal is also hypokinetic. FINDINGS  Left Ventricle: Left ventricular ejection fraction, by estimation, is 45 to 50%. The left ventricle has mildly decreased function. The left ventricle demonstrates regional wall motion abnormalities. Definity contrast agent was given IV to delineate the left ventricular endocardial borders. The left ventricular internal cavity size was normal in size. There is mild concentric left ventricular hypertrophy. Left ventricular diastolic parameters are consistent with Grade I diastolic dysfunction (impaired relaxation).  LV Wall Scoring: The entire anterior wall, mid anteroseptal segment, apical lateral segment, mid anterolateral segment, and apex are hypokinetic. The inferior septum, entire inferior wall, posterior wall, basal anteroseptal segment, and basal anterolateral segment are normal. Right Ventricle: The right ventricular size is normal. Right vetricular wall thickness was not well visualized. Right ventricular systolic function is mildly reduced. There is normal pulmonary artery systolic pressure. The tricuspid regurgitant velocity is 2.67 m/s, and with an assumed right atrial pressure of 3 mmHg, the estimated right ventricular systolic pressure is 31.5 mmHg. Left Atrium: Left atrial size was moderately dilated. Right Atrium: Right atrial size was mildly dilated. Pericardium: There is no evidence of pericardial effusion. Mitral Valve: The mitral valve is normal in structure. Trivial mitral valve regurgitation. No evidence of mitral valve stenosis.  Tricuspid Valve: The tricuspid valve is normal in structure. Tricuspid valve regurgitation is trivial. No evidence of tricuspid stenosis. Aortic Valve: The aortic valve is tricuspid. Aortic valve regurgitation is not visualized. No aortic stenosis is present. Pulmonic Valve: The pulmonic valve was grossly normal. Pulmonic valve regurgitation is not visualized. No evidence of pulmonic stenosis. Aorta: Aortic dilatation noted. There is mild dilatation of the ascending aorta, measuring 40 mm. Venous: The inferior vena cava is normal in size with greater than 50% respiratory variability, suggesting right atrial pressure of 3 mmHg. IAS/Shunts: The atrial septum is grossly normal.  LEFT VENTRICLE PLAX 2D LVIDd:         4.80 cm  Diastology LVIDs:         3.70 cm  LV e' medial:    5.77 cm/s LV PW:         1.20 cm  LV E/e' medial:  15.6 LV IVS:        1.20 cm  LV e' lateral:   8.16 cm/s LVOT diam:     2.00 cm  LV E/e' lateral: 11.1 LV SV:         75 LV SV Index:   35 LVOT Area:     3.14 cm  RIGHT VENTRICLE            IVC RV S prime:     9.03 cm/s  IVC diam: 1.40 cm TAPSE (M-mode): 1.7 cm LEFT ATRIUM             Index       RIGHT ATRIUM           Index LA diam:        4.10 cm 1.95 cm/m  RA Area:     18.60 cm LA Vol (A2C):   69.2 ml 32.85 ml/m RA Volume:   49.40 ml  23.45 ml/m LA Vol (A4C):   78.2 ml 37.12  ml/m LA Biplane Vol: 79.2 ml 37.59 ml/m  AORTIC VALVE LVOT Vmax:   102.00 cm/s LVOT Vmean:  74.000 cm/s LVOT VTI:    0.238 m  AORTA Ao Root diam: 3.50 cm Ao Asc diam:  4.00 cm MITRAL VALVE                TRICUSPID VALVE MV Area (PHT): 3.91 cm     TR Peak grad:   28.5 mmHg MV Decel Time: 194 msec     TR Vmax:        267.00 cm/s MV E velocity: 90.30 cm/s MV A velocity: 128.00 cm/s  SHUNTS MV E/A ratio:  0.71         Systemic VTI:  0.24 m                             Systemic Diam: 2.00 cm Jodelle Red MD Electronically signed by Jodelle Red MD Signature Date/Time: 04/09/2020/2:37:55 PM    Final     Disposition   Pt is being discharged home today in good condition.  Follow-up Plans & Appointments    Follow-up Information    Beatrice Lecher, PA-C Follow up on 05/01/2020.   Specialties: Cardiology, Physician Assistant Why: at 8:45am.  Contact information: [address] [place] [address] [phone]              Discharge Instructions    AMB Referral to Cardiac Rehabilitation - Phase II   Complete by: As directed    Diagnosis:  Coronary Stents NSTEMI     After initial evaluation and assessments completed: Virtual Based Care may be provided alone or in conjunction with Phase 2 Cardiac Rehab based on patient barriers.: Yes   AMB Referral to [facility]   Complete by: As directed    Reason For Referral: Lipids   Call MD for:  difficulty breathing, headache or visual disturbances   Complete by: As directed    Call MD for:  extreme fatigue   Complete by: As directed    Call MD for:  hives   Complete by: As directed    Call MD for:  persistant dizziness or light-headedness   Complete by: As directed    Call MD for:  persistant nausea and vomiting   Complete by: As directed    Call MD for:  redness, tenderness, or signs of infection (pain, swelling, redness, odor or green/yellow discharge around incision site)   Complete by: As directed    Call MD for:  severe uncontrolled pain   Complete by: As directed    Call MD for:  temperature >100.4   Complete by: As directed    Diet - low sodium heart healthy   Complete by: As directed    Discharge instructions   Complete by: As directed    PLEASE DO NOT MISS ANY DOSES OF YOU PLAVIX!!!!! Also keep a log of you blood pressures and bring back to your follow up appt. Please call the office with any questions.   Patients taking blood thinners should generally stay away from medicines like ibuprofen, Advil, Motrin, naproxen, and Aleve due to risk of stomach bleeding. You may take Tylenol as directed  or talk to your primary doctor about alternatives.  Some studies suggest Prilosec/Omeprazole interacts with Plavix. If you have reflux symptoms, please use Protonix for less chance of interaction.  PLEASE ENSURE THAT YOU DO NOT RUN OUT OF YOUR PLAVIX.  IF you have issues obtaining this medication due to cost please CALL the office 3-5 business days prior to running out in order to prevent missing doses of this medication.    No driving for 3-5 days. No lifting over 5 lbs for 1 week. No sexual activity for 1 week. Keep procedure site clean & dry. If you notice increased pain, swelling, bleeding or pus, call/return!  You may shower, but no soaking baths/hot tubs/pools for 1 week.   Increase activity slowly   Complete by: As directed      Discharge Medications   Allergies as of 04/10/2020      Reactions   Crestor [rosuvastatin Calcium]    myalgia      Medication List    STOP taking these medications   celecoxib 200 MG capsule Commonly known as: CELEBREX     TAKE these medications   amLODipine 10 MG tablet Commonly known as: NORVASC Take 1 tablet (10 mg total) by mouth daily. Start taking on: April 11, 2020   aspirin EC 81 MG tablet Take 81 mg by mouth daily.   atorvastatin 20 MG tablet Commonly known as: LIPITOR Take 20 mg by mouth daily.   carvedilol 25 MG tablet Commonly known as: COREG Take 25 mg by mouth 2 (two) times daily.   citalopram 20 MG tablet Commonly known as: CELEXA Take 20 mg by mouth daily.   clopidogrel 75 MG tablet Commonly known as: PLAVIX Take 75 mg by mouth daily. What changed: Another medication with the same name was added. Make sure you understand how and when to take each.   clopidogrel 75 MG tablet Commonly known as: PLAVIX Take 1 tablet (75 mg total) by mouth daily. Start taking on: April 11, 2020 What changed: You were already taking a medication with the same name, and this prescription was added. Make sure you understand how and  when to take each.   dorzolamide-timolol 22.3-6.8 MG/ML ophthalmic solution Commonly known as: COSOPT Place 1 drop into both eyes in the morning and at bedtime.   FISH OIL PO Take 1 capsule by mouth daily.   furosemide 40 MG tablet Commonly known as: LASIX Take 40 mg by mouth daily.   hydrALAZINE 50 MG tablet Commonly known as: APRESOLINE Take 1 tablet (50 mg total) by mouth 3 (three) times daily. What changed:   medication strength  how much to take  when to take this   insulin NPH Human 100 UNIT/ML injection Commonly known as: NovoLIN N Inject 0.2 mLs (20 Units total) into the skin 2 (two) times daily before a meal.   isosorbide mononitrate 30 MG 24 hr tablet Commonly known as: IMDUR Take 30 mg by mouth daily.   LORazepam 0.5 MG tablet Commonly known as: ATIVAN Take 0.5 mg by mouth 2 (two) times daily.   metFORMIN 500 MG tablet Commonly known as: GLUCOPHAGE Take 1 tablet (500 mg total) by mouth daily. Start taking on: April 11, 2020   oxyCODONE 15 MG immediate release tablet Commonly known as: ROXICODONE Take 15 mg by mouth 4 (four) times daily as needed.   pantoprazole 40 MG tablet Commonly known as: PROTONIX Take 1 tablet (40 mg total) by mouth 2 (two) times daily before a meal.   QUEtiapine 25 MG tablet Commonly known as: SEROQUEL Take 1 tablet by mouth in the morning and at bedtime.       Outstanding Labs/Studies   BMET   Duration of Discharge Encounter   Greater than 30 minutes  including physician time.  Signed, Georgie Chard, NP 04/10/2020, 12:35 PM

## 2020-04-10 NOTE — Progress Notes (Addendum)
Progress Note  Patient Name: Steven Osborne Date of Encounter: 04/10/2020  Primary Cardiologist: Dr. Eden Emms, MD   Subjective   Denies recurrent chest pain. Asking about Lipid clinic. Attempting to adjust antihypertensives for better BP control.   Inpatient Medications    Scheduled Meds: . amLODipine  5 mg Oral Daily  . aspirin EC  81 mg Oral Daily  . atorvastatin  20 mg Oral q1800  . carvedilol  25 mg Oral BID  . clopidogrel  75 mg Oral Daily  . enoxaparin (LOVENOX) injection  40 mg Subcutaneous Q24H  . hydrALAZINE  50 mg Oral Q8H  . influenza vaccine adjuvanted  0.5 mL Intramuscular Tomorrow-1000  . insulin aspart  0-5 Units Subcutaneous QHS  . insulin aspart  0-9 Units Subcutaneous TID WC  . omega-3 acid ethyl esters  1 g Oral Daily  . pantoprazole  40 mg Oral BID AC  . QUEtiapine  25 mg Oral QHS  . sodium chloride flush  3 mL Intravenous Q12H  . sodium chloride flush  3 mL Intravenous Q12H  . sodium chloride flush  3 mL Intravenous Q12H  . timolol  1 drop Right Eye BID   Continuous Infusions: . sodium chloride 10 mL/hr at 04/08/20 1510  . sodium chloride 100 mL/hr at 04/09/20 0542  . sodium chloride     PRN Meds: sodium chloride, sodium chloride, hydrALAZINE, sodium chloride flush   Vital Signs    Vitals:   04/09/20 1414 04/09/20 2114 04/10/20 0500 04/10/20 0639  BP: (!) 149/118 (!) 184/96  (!) 160/93  Pulse: 71 92  74  Resp: 18 18  15   Temp: 97.8 F (36.6 C) 98.3 F (36.8 C)  97.8 F (36.6 C)  TempSrc: Oral Oral  Oral  SpO2: 94% 95%  98%  Weight:   90.2 kg   Height:        Intake/Output Summary (Last 24 hours) at 04/10/2020 0858 Last data filed at 04/09/2020 1800 Gross per 24 hour  Intake 1793.83 ml  Output 400 ml  Net 1393.83 ml   Filed Weights   04/09/20 0450 04/09/20 0500 04/10/20 0500  Weight: 92.7 kg 92.7 kg 90.2 kg    Physical Exam   General: Well developed, well nourished, NAD Neck: Negative for carotid bruits. No JVD Lungs:Clear  to ausculation bilaterally. No wheezes, rales, or rhonchi. Breathing is unlabored. Cardiovascular: RRR with S1 S2. No murmurs Abdomen: Soft, non-tender, non-distended. No obvious abdominal masses. Extremities: No edema. No clubbing or cyanosis. DP/PT pulses 2+ bilaterally Neuro: Alert and oriented. No focal deficits. No facial asymmetry. MAE spontaneously. Psych: Responds to questions appropriately with normal affect.    Labs    Chemistry Recent Labs  Lab 04/08/20 1047 04/09/20 0210 04/10/20 0151  NA 136 135 137  K 3.7 3.9 3.7  CL 104 106 108  CO2 22 19* 20*  GLUCOSE 143* 200* 231*  BUN 25* 21 17  CREATININE 1.42* 1.49* 1.48*  CALCIUM 8.9 8.8* 8.7*  PROT  --  6.3*  --   ALBUMIN  --  3.2*  --   AST  --  17  --   ALT  --  15  --   ALKPHOS  --  65  --   BILITOT  --  1.1  --   GFRNONAA 51* 48* 48*  ANIONGAP 10 10 9      Hematology Recent Labs  Lab 04/08/20 0000 04/09/20 0210 04/10/20 0151  WBC 7.4 8.1 8.1  RBC 4.85 4.59  4.37  HGB 14.3 13.6 12.6*  HCT 41.9 38.7* 37.4*  MCV 86.4 84.3 85.6  MCH 29.5 29.6 28.8  MCHC 34.1 35.1 33.7  RDW 13.8 13.7 13.6  PLT 159 145* 131*    Cardiac EnzymesNo results for input(s): TROPONINI in the last 168 hours. No results for input(s): TROPIPOC in the last 168 hours.   BNP Recent Labs  Lab 04/08/20 0000  BNP 265.0*     DDimer No results for input(s): DDIMER in the last 168 hours.   Radiology    CARDIAC CATHETERIZATION  Result Date: 04/10/2020 Conclusions: 1. Significant multivessel coronary artery disease, including CTO's of mid LAD, ostial D1, and proximal OM1, as well as 95% OM2 stenosis (large vessel), 60% mid RCA disease, and 80-90% RPAV disease. 2. Widely patent LIMA-LAD. 3. Chronically occluded SVG-D1, SVG-OM1, and SVG-rPDA. 4. Moderately elevated left ventricular filling pressure (LVEDP 25-30 mmHg). 5. Successful OCT-guided PCI to OM2 using Resolute Onyx 3.5 x 15 mm drug-eluting stent (postdilated to 4.1 mm) with 0%  residual stenosis and TIMI-3 flow. Recommendations: 1. Continue indefinite DAPT with aspirin and clopidogrel. 2. Aggressive secondary prevention. 3. Consider gentle diuresis beginning tomorrow (sooner if patient develops heart failure symptoms), given moderately elevated LVEDP in the setting of CKD. 4. Favor medical therapy of RCA/RPAV disease unless patient has refractory angina. Yvonne Kendall, MD [facility]   ECHOCARDIOGRAM COMPLETE  Result Date: 04/09/2020    ECHOCARDIOGRAM REPORT   Patient Name:   Steven Osborne Date of Exam: 04/09/2020 Medical Rec #:  [id number]   Height:       70.0 in Accession #:    [id number]  Weight:       204.4 lb Date of Birth:  16-Dec-1940   BSA:          2.107 m Patient Age:    79 years    BP:           188/94 mmHg Patient Gender: M           HR:           69 bpm. Exam Location:  Inpatient Procedure: 2D Echo and Intracardiac Opacification Agent Indications:    chest pain  History:        Patient has prior history of Echocardiogram examinations, most                 recent 09/26/2017. Prior CABG, chronic kidney disease; Risk                 Factors:Dyslipidemia.  Sonographer:    Delcie Roch Referring Phys: [id number] OLADAPO ADEFESO IMPRESSIONS  1. Left ventricular ejection fraction, by estimation, is 45 to 50%. The left ventricle has mildly decreased function. The left ventricle demonstrates regional wall motion abnormalities (see scoring diagram/findings for description). There is mild concentric left ventricular hypertrophy. Left ventricular diastolic parameters are consistent with Grade I diastolic dysfunction (impaired relaxation).  2. Right ventricular systolic function is mildly reduced. The right ventricular size is normal. There is normal pulmonary artery systolic pressure.  3. Left atrial size was moderately dilated.  4. Right atrial size was mildly dilated.  5. The mitral valve is normal in structure. Trivial mitral valve regurgitation. No evidence of mitral stenosis.  6.  The aortic valve is tricuspid. Aortic valve regurgitation is not visualized. No aortic stenosis is present.  7. Aortic dilatation noted. There is mild dilatation of the ascending aorta, measuring 40 mm.  8. The inferior vena cava is normal in size with greater  than 50% respiratory variability, suggesting right atrial pressure of 3 mmHg. Comparison(s): Changes from prior study are noted. Conclusion(s)/Recommendation(s): Hypokinesis of the entire anterior wall and mid to distal anterolateral wall. Mid anteroseptal is also hypokinetic. FINDINGS  Left Ventricle: Left ventricular ejection fraction, by estimation, is 45 to 50%. The left ventricle has mildly decreased function. The left ventricle demonstrates regional wall motion abnormalities. Definity contrast agent was given IV to delineate the left ventricular endocardial borders. The left ventricular internal cavity size was normal in size. There is mild concentric left ventricular hypertrophy. Left ventricular diastolic parameters are consistent with Grade I diastolic dysfunction (impaired relaxation).  LV Wall Scoring: The entire anterior wall, mid anteroseptal segment, apical lateral segment, mid anterolateral segment, and apex are hypokinetic. The inferior septum, entire inferior wall, posterior wall, basal anteroseptal segment, and basal anterolateral segment are normal. Right Ventricle: The right ventricular size is normal. Right vetricular wall thickness was not well visualized. Right ventricular systolic function is mildly reduced. There is normal pulmonary artery systolic pressure. The tricuspid regurgitant velocity is 2.67 m/s, and with an assumed right atrial pressure of 3 mmHg, the estimated right ventricular systolic pressure is 31.5 mmHg. Left Atrium: Left atrial size was moderately dilated. Right Atrium: Right atrial size was mildly dilated. Pericardium: There is no evidence of pericardial effusion. Mitral Valve: The mitral valve is normal in structure.  Trivial mitral valve regurgitation. No evidence of mitral valve stenosis. Tricuspid Valve: The tricuspid valve is normal in structure. Tricuspid valve regurgitation is trivial. No evidence of tricuspid stenosis. Aortic Valve: The aortic valve is tricuspid. Aortic valve regurgitation is not visualized. No aortic stenosis is present. Pulmonic Valve: The pulmonic valve was grossly normal. Pulmonic valve regurgitation is not visualized. No evidence of pulmonic stenosis. Aorta: Aortic dilatation noted. There is mild dilatation of the ascending aorta, measuring 40 mm. Venous: The inferior vena cava is normal in size with greater than 50% respiratory variability, suggesting right atrial pressure of 3 mmHg. IAS/Shunts: The atrial septum is grossly normal.  LEFT VENTRICLE PLAX 2D LVIDd:         4.80 cm  Diastology LVIDs:         3.70 cm  LV e' medial:    5.77 cm/s LV PW:         1.20 cm  LV E/e' medial:  15.6 LV IVS:        1.20 cm  LV e' lateral:   8.16 cm/s LVOT diam:     2.00 cm  LV E/e' lateral: 11.1 LV SV:         75 LV SV Index:   35 LVOT Area:     3.14 cm  RIGHT VENTRICLE            IVC RV S prime:     9.03 cm/s  IVC diam: 1.40 cm TAPSE (M-mode): 1.7 cm LEFT ATRIUM             Index       RIGHT ATRIUM           Index LA diam:        4.10 cm 1.95 cm/m  RA Area:     18.60 cm LA Vol (A2C):   69.2 ml 32.85 ml/m RA Volume:   49.40 ml  23.45 ml/m LA Vol (A4C):   78.2 ml 37.12 ml/m LA Biplane Vol: 79.2 ml 37.59 ml/m  AORTIC VALVE LVOT Vmax:   102.00 cm/s LVOT Vmean:  74.000 cm/s LVOT VTI:  0.238 m  AORTA Ao Root diam: 3.50 cm Ao Asc diam:  4.00 cm MITRAL VALVE                TRICUSPID VALVE MV Area (PHT): 3.91 cm     TR Peak grad:   28.5 mmHg MV Decel Time: 194 msec     TR Vmax:        267.00 cm/s MV E velocity: 90.30 cm/s MV A velocity: 128.00 cm/s  SHUNTS MV E/A ratio:  0.71         Systemic VTI:  0.24 m                             Systemic Diam: 2.00 cm Jodelle RedBridgette Christopher MD Electronically signed by Jodelle RedBridgette  Christopher MD Signature Date/Time: 04/09/2020/2:37:55 PM    Final     Telemetry    04/10/20 NSR  - Personally Reviewed  ECG    No new tracing as of 04/10/20 Personally Reviewed  Cardiac Studies   LHC 04/09/20:  1. Left ventricular ejection fraction, by estimation, is 45 to 50%. The  left ventricle has mildly decreased function. The left ventricle  demonstrates regional wall motion abnormalities (see scoring  diagram/findings for description). There is mild  concentric left ventricular hypertrophy. Left ventricular diastolic  parameters are consistent with Grade I diastolic dysfunction (impaired  relaxation).  2. Right ventricular systolic function is mildly reduced. The right  ventricular size is normal. There is normal pulmonary artery systolic  pressure.  3. Left atrial size was moderately dilated.  4. Right atrial size was mildly dilated.  5. The mitral valve is normal in structure. Trivial mitral valve  regurgitation. No evidence of mitral stenosis.  6. The aortic valve is tricuspid. Aortic valve regurgitation is not  visualized. No aortic stenosis is present.  7. Aortic dilatation noted. There is mild dilatation of the ascending  aorta, measuring 40 mm.  8. The inferior vena cava is normal in size with greater than 50%  respiratory variability, suggesting right atrial pressure of 3 mmHg.   Conclusions: 1. Significant multivessel coronary artery disease, including CTO's of mid LAD, ostial D1, and proximal OM1, as well as 95% OM2 stenosis (large vessel), 60% mid RCA disease, and 80-90% RPAV disease. 2. Widely patent LIMA-LAD. 3. Chronically occluded SVG-D1, SVG-OM1, and SVG-rPDA. 4. Moderately elevated left ventricular filling pressure (LVEDP 25-30 mmHg). 5. Successful OCT-guided PCI to OM2 using Resolute Onyx 3.5 x 15 mm drug-eluting stent (postdilated to 4.1 mm) with 0% residual stenosis and TIMI-3 flow.  Recommendations: 1. Continue indefinite DAPT with aspirin  and clopidogrel. 2. Aggressive secondary prevention. 3. Consider gentle diuresis beginning tomorrow (sooner if patient develops heart failure symptoms), given moderately elevated LVEDP in the setting of CKD. 4. Favor medical therapy of RCA/RPAV disease unless patient has refractory angina.  Echo 04/09/20:  1. Left ventricular ejection fraction, by estimation, is 45 to 50%. The  left ventricle has mildly decreased function. The left ventricle  demonstrates regional wall motion abnormalities (see scoring  diagram/findings for description). There is mild  concentric left ventricular hypertrophy. Left ventricular diastolic  parameters are consistent with Grade I diastolic dysfunction (impaired  relaxation).  2. Right ventricular systolic function is mildly reduced. The right  ventricular size is normal. There is normal pulmonary artery systolic  pressure.  3. Left atrial size was moderately dilated.  4. Right atrial size was mildly dilated.  5. The mitral valve is normal  in structure. Trivial mitral valve  regurgitation. No evidence of mitral stenosis.  6. The aortic valve is tricuspid. Aortic valve regurgitation is not  visualized. No aortic stenosis is present.  7. Aortic dilatation noted. There is mild dilatation of the ascending  aorta, measuring 40 mm.  8. The inferior vena cava is normal in size with greater than 50%  respiratory variability, suggesting right atrial pressure of 3 mmHg.   Patient Profile     80 y.o. male with a past medical history of CAD (s/p CABG in 2005, cath in 12/2014 showing patent LIMA-LAD, patent native RCA and LCx with occluded SVG-RCA, occluded SVG-OM and occluded SVG-Diagonal with medical management recommended given collateral flow), chronic combined systolic and diastolic CHF/Ischemic Cardiomyopathy (EF previously 30-35%, at 40-45% by echo in 04/2018), HTN, HLD and Type 2 DMwho was seen the evaluation of NSTEMIat the request of Dr.  Thomes Dinning.  Assessment & Plan   1.  NSTEMI with history of CAD s/p CABG 2005: -Patient underwent CABG in 2005 with LHC in 2016 showing patent LIMA to LAD, patent native RCA and LCx with occluded SVG to RCA, occluded SVG to OM and occluded SVG to diagonal with recommendations for medical management given collateral flow. -Patient presented with a 3 to 4-week history of progressive dyspnea and chest pain found to have an initial high-sensitivity troponin at 288.  He was seen by the EP team and referred to [facility] for cardiac catheterization -LHC performed 04/09/2020 which showed significant multivessel CAD including CTO's of mid LAD, ostial D1 and proximal OM1 as well as a 95% OM 2 stenosis, 60% mid RCA and 80 to 90% RPAV disease with chronically occluded SVG to D1, SVG to OM1 and SVG to RPDA.  LVEDP elevated during cath with successful OCT guided PCI to OM2 performed.  Plans for indefinite DAPT with aspirin and Plavix with aggressive secondary prevention.  Plan was to gently diurese overnight given elevated LVEDP.  Favor medical management of the RCA/RPAV unless refractory angina per cath note. -Continue ASA, Plavix, carvedilol, statin>>struggles with statin intolerance.   2.  Chronic combined systolic and diastolic CHF with ischemic cardiomyopathy: -Previous LVEF at 30 to 35% with repeat echocardiogram in 2020 showing mild improvement to 40 to 45% -BNP mildly elevated on presentation at 265 -On PTA Lasix>>restart today and follow OP BMET -Cr today at baseline  -Repeat echocardiogram this admission the EF at 45-50% with concentric LVH and G1DD -No ACE/ARB due to renal dysfunction   3.  Hypertensive urgency: -Markedly elevated on hospital presentation however stable today -Continue carvedilol 25 twice daily, hydralazine 50 3 times daily, amlodipine 5 -Will increase amlodipine to 10 and hydralazine to 75 TID  -Add PTA Imdur 30 back to regimen   4.  DM2: -Hemoglobin A1c, 7.8 -On SSI for glucose  control inpatient status -On PTA Metformin, held during hospitalization with plan to restart 04/11/2020  5.  HLD: -LDL, 193 -Struggles with statin intolerance -Continued on Lovaza 1 g daily -Plan for lipid clinic referral for PCSK9 inhibitor discharge  6.  CKD stage III: -Baseline creatinine appears to 1.3-1.4 range from 04/2018 -Creatinine today, 1.48 -Continue to avoid nephrotoxic medications -Plan to repeat labs at outpatient follow-up -Attempt to add Jardiance   7. Aortic dilatation: -Measuring 40mm on echo>>>follow with surveillance studies.  -Needs better BP control  -Med adjustments as above    Signed, Georgie Chard NP-C [facility] Pager: [phone] 04/10/2020, 8:58 AM    Agree with note by Georgie Chard NP  Patient  mated in transfer from [facility] by Dr. Eden Emms with chest pain and non-STEMI.  He has had remote CABG in 2005.  His troponins rose to the 200 level.  Cath revealed occluded vein graft, diffusely diseased RCA, patent LIMA to the LAD, occluded vein to his circumflex.  He had a high-grade AV groove circumflex stenosis that underwent OCT guided PCI and drug-eluting stenting with excellent result.  His EF was 45 to 50% by 2D echo.  His LDL is significantly elevated and is statin intolerant.  Will refer him to lipid clinic.  His LVEDP is elevated 25-30.  He will get additional Lasix prior to discharge.  He wishes to pursue his outpatient follow-up with [place].  We will have him see an APP in 7 to 10 days (TOC 7) and follow-up with Dr. Eden Emms after that.  We will also make an appointment for him in the lipid clinic to discuss statin alternatives.   Runell Gess, M.D., FACP, [facility], [facility] [address] [place], [place]  [address]  [phone] 04/10/2020 10:15 AM  For questions or updates, please contact   Please consult [email or web address] for contact info under Cardiology/STEMI.

## 2020-04-30 NOTE — Progress Notes (Deleted)
Cardiology Office Note:    Date:  04/30/2020   ID:  Steven Osborne, DOB 26-Feb-1940, MRN [id number]  PCP:  Jason Coop, FNP  Prior Cardiologist: Dr. Larose Hires at [facility])   [facility]  Cardiologist:  No primary care provider on file. *** Advanced Practice Provider:  No care team member to display Electrophysiologist:  None  {Press F2 to show EP APP, CHF, sleep or structural heart MD               :[id number]}  { Click here to update then REFRESH NOTE - MD (PCP) or APP (Team Member)  Change PCP Type for MD, Specialty for APP is either Cardiology or Clinical Cardiac Electrophysiology  :[id number]}   Referring MD: Dorene Grebe*   Chief Complaint:  No chief complaint on file.    Patient Profile:    Steven Osborne is a 80 y.o. male with:   Coronary artery disease   S/p CABG in 2005  Cath 11/16 ([facility]): L-LAD patent; S-RCA, S-OM, S-Dx occluded  NSTEMI 2/22 s/p DES to OM2  Cath: L-LAD patent; VGs to RPDA, OM1, D1 all occluded; native RCA 60, RPAV 85   Med Rx for RCA, RPAV unless refractory angina   HFmrEF (heart failure with mildly reduced ejection fraction)    Ischemic CM w/ EF 30-35  Echocardiogram 05/14/2018: EF 40-45, mil LVH, ant-lat HK, Gr 2 DD ([facility])  Echocardiogram 2/22: EF 45-50   Not on ACE/ARB/ARNI/MRA due to CKD   Diabetes mellitus   Hypertension   Hyperlipidemia   Intol to statins  Chronic kidney disease   Glaucoma   Hx of GI bleed (NSAIDs)  Prolonged QT  Dilated ascending aorta  40 mm on echocardiogram 21-Apr-2020  Prior CV studies: Cardiac catheterization 21-Apr-2020 LM normal LAD prox 90, mid 100; D1 100 LCx mid 30; OM1 100; OM2 30, 95 (culprit) RCA mid 60; RPAV 85 S-RPDA 100 S-OM1 100 S-D1 100 L-LAD patent  PCI: 3.5 x 15 mm Resolute Onyx DES to OM2      Echocardiogram 2020-04-21 EF 45-50, mild LVH, Gr 1 DD, ant/ant-sept/apical lat/ant-lat/apex HK, mildly reduced RVSF, mod LAE,  mild RAE, trivial MR, mild dilation of ascending aorta (40 mm)  History of Present Illness:    Steven Osborne was admitted 2/15-2/18 with a NSTEMI.  He was transferred from [facility] to [facility].  Cardiac catheterization demonstrated multivessel CAD with CTO of the mLAD, oD1, pOM1 and 95% OM2 stenosis, mRCA 60% and RPAV 80-90%.  The L-LAD was patent and all VGs were occluded.  The high grade stenosis in the OM2 was felt to be the culprit and this was treated with a DES.  Med Rx was recommended for the RCA and RPAV unless he has refractory angina.  LVEDP was elevated and he was given 1 dose of IV furosemide.  He will need referral to Lipid Clinic for PCSK9i due to statin intolerance.  His ascending aorta was dilated on echocardiogram and he will need a repeat study in 1 year.  His EF was improved from prior at 45-50. He preferred to f/u with [facility].   He returns for f/u.  ***  Past Medical History:  Diagnosis Date  . ARTERIOSCLEROSIS 02/10/2010  . CAD 02/25/2010  . DIABETES TYPE 2 02/10/2010  . Glaucoma    right eye  . HYPERLIPIDEMIA NOS 02/10/2010  . HYPERTENSION 02/10/2010  . INTERMITTENT CLAUDICATION 02/10/2010  . MI, old     Current Medications:  No outpatient medications have been marked as taking for the 05/01/20 encounter (Appointment) with Tereso Newcomer T, PA-C.     Allergies:   Crestor [rosuvastatin calcium]   Social History   Tobacco Use  . Smoking status: Former Smoker    Types: Cigars  . Smokeless tobacco: Former Neurosurgeon    Types: Chew    Quit date: 02/21/2014  Vaping Use  . Vaping Use: Never used  Substance Use Topics  . Alcohol use: No    Comment: as a teenager to late 20s, none since   . Drug use: No     Family Hx: The patient's family history includes Diabetes in his brother, father, mother, and sister; Heart disease in his father and mother; Hypertension in an other family member; Stroke in his father. There is no history of Colon cancer or Colon polyps.  ROS    EKGs/Labs/Other Test Reviewed:    EKG:  EKG is *** ordered today.  The ekg ordered today demonstrates ***  Recent Labs: 04/08/2020: B Natriuretic Peptide 265.0; Magnesium 1.8 04/09/2020: ALT 15 04/10/2020: BUN 17; Creatinine, Ser 1.48; Hemoglobin 12.6; Platelets 131; Potassium 3.7; Sodium 137   Recent Lipid Panel Lab Results  Component Value Date/Time   CHOL 257 (H) 04/09/2020 02:10 AM   TRIG 222 (H) 04/09/2020 02:10 AM   HDL 30 (L) 04/09/2020 02:10 AM   CHOLHDL 8.6 04/09/2020 02:10 AM   LDLCALC 183 (H) 04/09/2020 02:10 AM   LDLDIRECT 129.5 01/01/2014 01:58 PM      Risk Assessment/Calculations:   {Does this patient have ATRIAL FIBRILLATION?:[phone]}  Physical Exam:    VS:  There were no vitals taken for this visit.    Wt Readings from Last 3 Encounters:  04/10/20 198 lb 13.7 oz (90.2 kg)  03/02/18 210 lb (95.3 kg)  12/19/17 196 lb 6.9 oz (89.1 kg)     Physical Exam ***  ASSESSMENT & PLAN:    ***  {Are you ordering a CV Procedure (e.g. stress test, cath, DCCV, TEE, etc)?   Press F2        :[id number]}    Dispo:  No follow-ups on file.   Medication Adjustments/Labs and Tests Ordered: Current medicines are reviewed at length with the patient today.  Concerns regarding medicines are outlined above.  Tests Ordered: No orders of the defined types were placed in this encounter.  Medication Changes: No orders of the defined types were placed in this encounter.   Signed, Tereso Newcomer, PA-C  04/30/2020 5:38 PM    [facility] [address] [place], [place], [place]  [address] Phone: [phone]; Fax: [phone]

## 2020-05-01 ENCOUNTER — Ambulatory Visit: Payer: Medicare HMO | Admitting: Physician Assistant

## 2020-05-01 DIAGNOSIS — I77810 Thoracic aortic ectasia: Secondary | ICD-10-CM

## 2020-05-01 DIAGNOSIS — I10 Essential (primary) hypertension: Secondary | ICD-10-CM

## 2020-05-01 DIAGNOSIS — E782 Mixed hyperlipidemia: Secondary | ICD-10-CM

## 2020-05-01 DIAGNOSIS — I5020 Unspecified systolic (congestive) heart failure: Secondary | ICD-10-CM

## 2020-05-01 DIAGNOSIS — I214 Non-ST elevation (NSTEMI) myocardial infarction: Secondary | ICD-10-CM

## 2020-05-01 DIAGNOSIS — E1122 Type 2 diabetes mellitus with diabetic chronic kidney disease: Secondary | ICD-10-CM

## 2020-05-04 ENCOUNTER — Encounter: Payer: Self-pay | Admitting: General Practice

## 2020-05-11 ENCOUNTER — Emergency Department (HOSPITAL_COMMUNITY)
Admission: EM | Admit: 2020-05-11 | Discharge: 2020-05-12 | Disposition: A | Discharge status: Home / Self Care | Payer: Medicare HMO | Attending: Emergency Medicine | Admitting: Emergency Medicine

## 2020-05-11 ENCOUNTER — Other Ambulatory Visit: Payer: Self-pay

## 2020-05-11 ENCOUNTER — Encounter (HOSPITAL_COMMUNITY): Payer: Self-pay

## 2020-05-11 DIAGNOSIS — I5022 Chronic systolic (congestive) heart failure: Secondary | ICD-10-CM | POA: Insufficient documentation

## 2020-05-11 DIAGNOSIS — Z7982 Long term (current) use of aspirin: Secondary | ICD-10-CM | POA: Diagnosis not present

## 2020-05-11 DIAGNOSIS — Z951 Presence of aortocoronary bypass graft: Secondary | ICD-10-CM | POA: Insufficient documentation

## 2020-05-11 DIAGNOSIS — S51012A Laceration without foreign body of left elbow, initial encounter: Secondary | ICD-10-CM | POA: Insufficient documentation

## 2020-05-11 DIAGNOSIS — N183 Chronic kidney disease, stage 3 unspecified: Secondary | ICD-10-CM | POA: Diagnosis not present

## 2020-05-11 DIAGNOSIS — Z23 Encounter for immunization: Secondary | ICD-10-CM | POA: Diagnosis not present

## 2020-05-11 DIAGNOSIS — Z7902 Long term (current) use of antithrombotics/antiplatelets: Secondary | ICD-10-CM | POA: Diagnosis not present

## 2020-05-11 DIAGNOSIS — Z7984 Long term (current) use of oral hypoglycemic drugs: Secondary | ICD-10-CM | POA: Diagnosis not present

## 2020-05-11 DIAGNOSIS — I130 Hypertensive heart and chronic kidney disease with heart failure and stage 1 through stage 4 chronic kidney disease, or unspecified chronic kidney disease: Secondary | ICD-10-CM | POA: Diagnosis not present

## 2020-05-11 DIAGNOSIS — S59902A Unspecified injury of left elbow, initial encounter: Secondary | ICD-10-CM | POA: Diagnosis present

## 2020-05-11 DIAGNOSIS — Z87891 Personal history of nicotine dependence: Secondary | ICD-10-CM | POA: Insufficient documentation

## 2020-05-11 DIAGNOSIS — W19XXXA Unspecified fall, initial encounter: Secondary | ICD-10-CM | POA: Insufficient documentation

## 2020-05-11 DIAGNOSIS — I2510 Atherosclerotic heart disease of native coronary artery without angina pectoris: Secondary | ICD-10-CM | POA: Diagnosis not present

## 2020-05-11 DIAGNOSIS — Z794 Long term (current) use of insulin: Secondary | ICD-10-CM | POA: Insufficient documentation

## 2020-05-11 DIAGNOSIS — Z79899 Other long term (current) drug therapy: Secondary | ICD-10-CM | POA: Diagnosis not present

## 2020-05-11 DIAGNOSIS — Y92019 Unspecified place in single-family (private) house as the place of occurrence of the external cause: Secondary | ICD-10-CM | POA: Insufficient documentation

## 2020-05-11 DIAGNOSIS — E1122 Type 2 diabetes mellitus with diabetic chronic kidney disease: Secondary | ICD-10-CM | POA: Insufficient documentation

## 2020-05-11 NOTE — ED Triage Notes (Signed)
pov from home with cc of laceration to left elbow from a fall. He lost his footing getting out the car. Bleeding controlled with dressing is on blood thinners.

## 2020-05-12 ENCOUNTER — Emergency Department (HOSPITAL_COMMUNITY): Payer: Medicare HMO

## 2020-05-12 MED ORDER — POVIDONE-IODINE 10 % EX SOLN
CUTANEOUS | Status: AC
  Filled 2020-05-12: qty 15

## 2020-05-12 MED ORDER — CEPHALEXIN 500 MG PO CAPS: 500.0000 mg | ORAL_CAPSULE | Freq: Two times a day (BID) | ORAL | 0 refills | Status: DC

## 2020-05-12 MED ORDER — LIDOCAINE-EPINEPHRINE (PF) 1 %-1:200000 IJ SOLN
20.0000 mL | Freq: Once | INTRAMUSCULAR | Status: AC
  Administered 2020-05-12: 20 mL
  Filled 2020-05-12: qty 30

## 2020-05-12 MED ORDER — TETANUS-DIPHTH-ACELL PERTUSSIS 5-2.5-18.5 LF-MCG/0.5 IM SUSY
0.5000 mL | PREFILLED_SYRINGE | Freq: Once | INTRAMUSCULAR | Status: AC
  Administered 2020-05-12: 0.5 mL via INTRAMUSCULAR
  Filled 2020-05-12: qty 0.5

## 2020-05-12 MED ORDER — POVIDONE-IODINE 5 % EX SOLN
Freq: Once | CUTANEOUS | Status: AC
  Filled 2020-05-12: qty 88.7

## 2020-05-12 NOTE — Discharge Instructions (Signed)
Take the antibiotics as prescribed.  Keep wound clean and dry.  Follow-up with Dr. Romeo Apple for a wound check later this week.  Sutures should be removed in 7 days.  Return to the ED sooner with worsening pain, fever, spreading redness or any other concerns.

## 2020-05-12 NOTE — ED Provider Notes (Signed)
[facility] EMERGENCY DEPARTMENT Provider Note   CSN: [id number] Arrival date & time: 05/11/20  2230     History Chief Complaint  Patient presents with  . Laceration    Steven Osborne is a 80 y.o. male.  Patient with history of diabetes, hypertension, CAD with stents on aspirin and Plavix presenting after a fall today.  States he was at a family member's house on a new asphalt surface and there was a dog running around his feet.  He stumbled and landed onto his left elbow around 4 PM.  States a laceration to his elbow.  Denies hitting his head or losing consciousness.  Denies any head, neck, back, chest or abdominal pain.  Comes in tonight because the wound is persistently bleeding. Denies any dizzy spell or syncopal episode. He is confident he did not hit his head.  Denies any preceding chest pain or shortness of breath.  No abdominal pain, nausea or vomiting. No focal weakness, numbness or tingling.  The history is provided by the patient and a relative.  Laceration Associated symptoms: no fever        Past Medical History:  Diagnosis Date  . ARTERIOSCLEROSIS 02/10/2010  . CAD 02/25/2010  . DIABETES TYPE 2 02/10/2010  . Glaucoma    right eye  . HYPERLIPIDEMIA NOS 02/10/2010  . HYPERTENSION 02/10/2010  . INTERMITTENT CLAUDICATION 02/10/2010  . MI, old     Patient Active Problem List   Diagnosis Date Noted  . NSTEMI (non-ST elevated myocardial infarction) (HCC) 04/08/2020  . Ischemic chest pain (HCC) 04/08/2020  . Prolonged QT interval 04/08/2020  . AKI (acute kidney injury) (HCC) 04/08/2020  . GERD (gastroesophageal reflux disease) 04/08/2020  . Elevated troponin 04/08/2020  . Elevated brain natriuretic peptide (BNP) level 04/08/2020  . Hypertensive urgency 04/08/2020  . Acute upper GI bleed   . GI bleed 12/17/2017  . CKD stage 3 due to type 2 diabetes mellitus (HCC) 09/25/2017  . Dehydration 09/24/2017  . Ischemic cardiomyopathy 09/24/2017  . Chronic systolic CHF  (congestive heart failure) (HCC) 09/24/2017  . Type 2 diabetes mellitus with proliferative retinopathy (HCC) 09/24/2017  . Thrombocytopenia (HCC) 09/24/2017  . Hypotension   . Near syncope   . Diabetes (HCC) 06/09/2015  . CAD (coronary artery disease) 02/25/2010  . Hyperlipidemia 02/10/2010  . Essential hypertension 02/10/2010  . ARTERIOSCLEROSIS 02/10/2010  . INTERMITTENT CLAUDICATION 02/10/2010    Past Surgical History:  Procedure Laterality Date  . BIOPSY  12/18/2017   Procedure: BIOPSY;  Surgeon: West Bali, MD;  Location: [facility] ENDO SU[facility];  Service: Endoscopy;;  . CORONARY ARTERY BYPASS GRAFT  2005   4 vessel bypass   . CORONARY STENT INTERVENTION N/A 04/09/2020   Procedure: CORONARY STENT INTERVENTION;  Surgeon: Yvonne Kendall, MD;  Location: [facility] INVASIVE CV LAB;  Service: Cardiovascular;  Laterality: N/A;  OM-2  . ESOPHAGOGASTRODUODENOSCOPY N/A 12/18/2017   Procedure: ESOPHAGOGASTRODUODENOSCOPY (EGD);  Surgeon: West Bali, MD;  Location: [facility] ENDO SUITE;  Service: Endoscopy;  Laterality: N/A;  . INTRAVASCULAR IMAGING/OCT N/A 04/09/2020   Procedure: INTRAVASCULAR IMAGING/OCT;  Surgeon: Yvonne Kendall, MD;  Location: [facility] INVASIVE CV LAB;  Service: Cardiovascular;  Laterality: N/A;  . LEFT HEART CATH AND CORS/GRAFTS ANGIOGRAPHY N/A 04/09/2020   Procedure: LEFT HEART CATH AND CORS/GRAFTS ANGIOGRAPHY;  Surgeon: Yvonne Kendall, MD;  Location: [facility] INVASIVE CV LAB;  Service: Cardiovascular;  Laterality: N/A;       Family History  Problem Relation Age of Onset  . Diabetes Mother   . Heart  disease Mother   . Diabetes Father   . Heart disease Father   . Stroke Father   . Diabetes Sister   . Diabetes Brother   . Hypertension Other   . Colon cancer Neg Hx   . Colon polyps Neg Hx     Social History   Tobacco Use  . Smoking status: Former Smoker    Types: Cigars  . Smokeless tobacco: Former Neurosurgeon    Types: Chew    Quit date: 02/21/2014  Vaping Use  . Vaping Use: Never  used  Substance Use Topics  . Alcohol use: No    Comment: as a teenager to late 20s, none since   . Drug use: No    Home Medications Prior to Admission medications   Medication Sig Start Date End Date Taking? Authorizing Provider  amLODipine (NORVASC) 10 MG tablet Take 1 tablet (10 mg total) by mouth daily. 04/11/20   Wendall Stade, MD  aspirin EC 81 MG tablet Take 81 mg by mouth daily.    [provider]  atorvastatin (LIPITOR) 20 MG tablet Take 20 mg by mouth daily. 11/08/19   [provider]  carvedilol (COREG) 25 MG tablet Take 25 mg by mouth 2 (two) times daily. 04/02/20   [provider]  citalopram (CELEXA) 20 MG tablet Take 20 mg by mouth daily. 02/06/20   [provider]  clopidogrel (PLAVIX) 75 MG tablet Take 75 mg by mouth daily. 02/06/20   [provider]  clopidogrel (PLAVIX) 75 MG tablet Take 1 tablet (75 mg total) by mouth daily. 04/11/20   Filbert Schilder, NP  dorzolamide-timolol (COSOPT) 22.3-6.8 MG/ML ophthalmic solution Place 1 drop into both eyes in the morning and at bedtime. 03/31/20   [provider]  furosemide (LASIX) 40 MG tablet Take 40 mg by mouth daily. 02/06/20   [provider]  hydrALAZINE (APRESOLINE) 50 MG tablet Take 1 tablet (50 mg total) by mouth 3 (three) times daily. 04/10/20   Wendall Stade, MD  insulin NPH Human (NOVOLIN N) 100 UNIT/ML injection Inject 0.2 mLs (20 Units total) into the skin 2 (two) times daily before a meal. 09/26/17   Tat, Onalee Hua, MD  isosorbide mononitrate (IMDUR) 30 MG 24 hr tablet Take 30 mg by mouth daily.  12/11/15   [provider]  LORazepam (ATIVAN) 0.5 MG tablet Take 0.5 mg by mouth 2 (two) times daily.    [provider]  metFORMIN (GLUCOPHAGE) 500 MG tablet Take 1 tablet (500 mg total) by mouth daily. 04/11/20   Wendall Stade, MD  Omega-3 Fatty Acids (FISH OIL PO) Take 1 capsule by mouth daily.    [provider]  oxyCODONE (ROXICODONE)  15 MG immediate release tablet Take 15 mg by mouth 4 (four) times daily as needed. 04/04/20   [provider]  pantoprazole (PROTONIX) 40 MG tablet Take 1 tablet (40 mg total) by mouth 2 (two) times daily before a meal. 12/19/17   Erick Blinks, MD  QUEtiapine (SEROQUEL) 25 MG tablet Take 1 tablet by mouth in the morning and at bedtime. 02/11/20   [provider]    Allergies    Crestor [rosuvastatin calcium]  Review of Systems   Review of Systems  Constitutional: Negative for activity change, appetite change and fever.  HENT: Negative for congestion and rhinorrhea.   Respiratory: Negative for shortness of breath.   Cardiovascular: Negative for chest pain.  Gastrointestinal: Negative for abdominal pain, nausea and vomiting.  Genitourinary:  Negative for dysuria and hematuria.  Skin: Positive for wound.  Neurological: Negative for dizziness, weakness, light-headedness and headaches.    all other systems are negative except as noted in the HPI and PMH.   Physical Exam Updated Vital Signs BP (!) 150/107 (BP Location: Right Arm)   Pulse 71   Temp 97.8 F (36.6 C) (Oral)   Resp 18   Ht  (1.778 m)   Wt 90.2 kg   SpO2 98%   BMI 28.53 kg/m   Physical Exam Vitals and nursing note reviewed.  Constitutional:      General: He is not in acute distress.    Appearance: He is well-developed.  HENT:     Head: Normocephalic and atraumatic.     Mouth/Throat:     Pharynx: No oropharyngeal exudate.  Eyes:     Conjunctiva/sclera: Conjunctivae normal.     Pupils: Pupils are equal, round, and reactive to light.  Neck:     Comments: No C-spine tenderness Cardiovascular:     Rate and Rhythm: Normal rate and regular rhythm.     Heart sounds: Normal heart sounds. No murmur heard.   Pulmonary:     Effort: Pulmonary effort is normal. No respiratory distress.     Breath sounds: Normal breath sounds.  Abdominal:     Palpations: Abdomen is soft.     Tenderness: There  is no abdominal tenderness. There is no guarding or rebound.  Musculoskeletal:        General: Tenderness and signs of injury present. Normal range of motion.     Cervical back: Normal range of motion and neck supple.     Comments: 3 cm laceration to left olecranon.  Hemostatic. Full range of motion of left elbow Intact radial pulse and grip strength.  No T or L-spine tenderness  Pelvis stable, full range of motion of hips  Skin:    General: Skin is warm.  Neurological:     Mental Status: He is alert and oriented to person, place, and time.     Cranial Nerves: No cranial nerve deficit.     Motor: No abnormal muscle tone.     Coordination: Coordination normal.     Comments: No ataxia on finger to nose bilaterally. No pronator drift. 5/5 strength throughout. CN 2-12 intact.Equal grip strength. Sensation intact.   Psychiatric:        Behavior: Behavior normal.     ED Results / Procedures / Treatments   Labs (all labs ordered are listed, but only abnormal results are displayed) Labs Reviewed - No data to display  EKG None  Radiology DG Elbow Complete Left  Result Date: 05/12/2020 CLINICAL DATA:  Fall, laceration to the posterior elbow, bleeding controlled, on blood thinners EXAM: LEFT ELBOW - COMPLETE 3+ VIEW COMPARISON:  None. FINDINGS: Soft tissue swelling and irregularity along the posterior elbow compatible with reported site of laceration. No soft tissue gas or foreign body is seen. No acute bony abnormality. Specifically, no fracture, subluxation, or dislocation. Trace elbow joint effusion. Background of degenerative changes throughout the elbow as well as enthesopathic changes upon the olecranon including a fragmented osteophyte which appears well corticated and likely remote. Additional enthesopathic changes upon the medial and lateral epicondyles. IMPRESSION: 1. Soft tissue swelling and irregularity along the posterior elbow compatible with reported site of laceration. No soft  tissue gas or foreign body. 2. Trace elbow effusion.  No discernible acute bony abnormality. 3. Degenerative changes and enthesopathy. Electronically Signed   By: Samuella Cota  [facility] M.D.   On: 05/12/2020 02:04    Procedures .Marland KitchenLaceration Repair  Date/Time: 05/12/2020 2:26 AM Performed by: Glynn Octave, MD Authorized by: Glynn Octave, MD   Consent:    Consent obtained:  Verbal   Consent given by:  Patient   Risks, benefits, and alternatives were discussed: yes     Risks discussed:  Infection, need for additional repair, nerve damage, poor wound healing, poor cosmetic result, pain, vascular damage, tendon damage and retained foreign body   Alternatives discussed:  No treatment Universal protocol:    Procedure explained and questions answered to patient or proxy's satisfaction: yes     Relevant documents present and verified: yes     Test results available: yes     Imaging studies available: yes     Required blood products, implants, devices, and special equipment available: no     Site/side marked: yes     Immediately prior to procedure, a time out was called: yes     Patient identity confirmed:  Arm band and verbally with patient Anesthesia:    Anesthesia method:  Local infiltration   Local anesthetic:  Lidocaine 1% WITH epi Laceration details:    Location:  Shoulder/arm   Shoulder/arm location:  L elbow   Length (cm):  3 Pre-procedure details:    Preparation:  Patient was prepped and draped in usual sterile fashion and imaging obtained to evaluate for foreign bodies Exploration:    Limited defect created (wound extended): no     Hemostasis achieved with:  Epinephrine and direct pressure   Imaging obtained: x-ray     Imaging outcome: foreign body not noted     Wound exploration: wound explored through full range of motion and entire depth of wound visualized     Wound extent: no nerve damage noted and no tendon damage noted     Contaminated: no   Treatment:    Area cleansed  with:  Povidone-iodine   Amount of cleaning:  Standard   Irrigation solution:  Sterile saline   Irrigation volume:  1000   Irrigation method:  Pressure wash   Visualized foreign bodies/material removed: no     Debridement:  None   Undermining:  None   Scar revision: no   Skin repair:    Repair method:  Sutures   Suture size:  4-0   Suture material:  Prolene   Suture technique:  Simple interrupted   Number of sutures:  5 Approximation:    Approximation:  Loose Repair type:    Repair type:  Simple Post-procedure details:    Dressing:  Antibiotic ointment and adhesive bandage   Procedure completion:  Tolerated well, no immediate complications     Medications Ordered in ED Medications  Tdap (BOOSTRIX) injection 0.5 mL (has no administration in time range)  lidocaine-EPINEPHrine (XYLOCAINE-EPINEPHrine) 1 %-1:200000 (PF) injection 20 mL (has no administration in time range)  povidone-Iodine (BETADINE) 5 % topical solution (has no administration in time range)    ED Course  I have reviewed the triage vital signs and the nursing notes.  Pertinent labs & imaging results that were available during my care of the patient were reviewed by me and considered in my medical decision making (see chart for details).    MDM Rules/Calculators/A&P                         Fall with laceration to elbow. Does not appear to involve joint space. Denies head injury. He  takes Plavix and aspirin. Normal neurological exam.  Tetanus is updated.  X-ray shows questionable chip of olecranon.  Discussed with Dr. Elvera MariaeHay with radiology.  He favors old appearing osteophyte and does not think this is an acute fracture  Patient declined CT head.  Wound irrigated and repaired as above.  Prophylactic antibiotics given.  Follow-up with orthopedics.  Staple removal in 7 days. Return to the ED sooner with worsening pain, fever, swelling or other concerns Final Clinical Impression(s) / ED Diagnoses Final  diagnoses:  Laceration of left elbow, initial encounter    Rx / DC Orders ED Discharge Orders    None       Rancour, Jeannett SeniorStephen, MD 05/12/20 [phone]

## 2020-05-12 NOTE — ED Notes (Signed)
Left elbow laceration cleansed with soap and water.

## 2020-05-27 ENCOUNTER — Telehealth: Payer: Self-pay

## 2020-05-27 MED ORDER — AMLODIPINE BESYLATE 10 MG PO TABS: 1.0000 | ORAL_TABLET | Freq: Every day | ORAL | 0 refills | Status: DC

## 2020-05-27 NOTE — Telephone Encounter (Signed)
Medication refill request approved for Amlodipine 10 mg tablets (#30) and sent to [facility]. Patient was seen by P. Eden Emms, MD in the ED on 04/07/2020. Patient needs follow up appointment for further refills.

## 2020-06-17 ENCOUNTER — Other Ambulatory Visit: Payer: Self-pay | Admitting: Cardiovascular Disease

## 2020-08-10 ENCOUNTER — Other Ambulatory Visit: Payer: Self-pay | Admitting: Cardiovascular Disease

## 2020-10-26 ENCOUNTER — Other Ambulatory Visit: Payer: Self-pay | Admitting: Cardiovascular Disease

## 2020-12-10 ENCOUNTER — Encounter: Payer: Self-pay | Admitting: Family Medicine

## 2020-12-10 ENCOUNTER — Other Ambulatory Visit: Payer: Self-pay

## 2020-12-10 ENCOUNTER — Ambulatory Visit (INDEPENDENT_AMBULATORY_CARE_PROVIDER_SITE_OTHER): Payer: Medicare HMO | Admitting: Family Medicine

## 2020-12-10 VITALS — BP 142/70 | HR 50 | Temp 98.0°F | Ht 70.0 in | Wt 220.0 lb

## 2020-12-10 DIAGNOSIS — E1122 Type 2 diabetes mellitus with diabetic chronic kidney disease: Secondary | ICD-10-CM

## 2020-12-10 DIAGNOSIS — I152 Hypertension secondary to endocrine disorders: Secondary | ICD-10-CM

## 2020-12-10 DIAGNOSIS — Z951 Presence of aortocoronary bypass graft: Secondary | ICD-10-CM

## 2020-12-10 DIAGNOSIS — Z794 Long term (current) use of insulin: Secondary | ICD-10-CM

## 2020-12-10 DIAGNOSIS — E1159 Type 2 diabetes mellitus with other circulatory complications: Secondary | ICD-10-CM | POA: Diagnosis not present

## 2020-12-10 DIAGNOSIS — N183 Chronic kidney disease, stage 3 unspecified: Secondary | ICD-10-CM

## 2020-12-10 DIAGNOSIS — Z23 Encounter for immunization: Secondary | ICD-10-CM | POA: Diagnosis not present

## 2020-12-10 DIAGNOSIS — E113312 Type 2 diabetes mellitus with moderate nonproliferative diabetic retinopathy with macular edema, left eye: Secondary | ICD-10-CM | POA: Diagnosis not present

## 2020-12-10 DIAGNOSIS — E1169 Type 2 diabetes mellitus with other specified complication: Secondary | ICD-10-CM | POA: Diagnosis not present

## 2020-12-10 DIAGNOSIS — I252 Old myocardial infarction: Secondary | ICD-10-CM

## 2020-12-10 DIAGNOSIS — E785 Hyperlipidemia, unspecified: Secondary | ICD-10-CM

## 2020-12-10 DIAGNOSIS — Z955 Presence of coronary angioplasty implant and graft: Secondary | ICD-10-CM | POA: Insufficient documentation

## 2020-12-10 DIAGNOSIS — H44511 Absolute glaucoma, right eye: Secondary | ICD-10-CM

## 2020-12-10 DIAGNOSIS — I5022 Chronic systolic (congestive) heart failure: Secondary | ICD-10-CM

## 2020-12-10 DIAGNOSIS — D696 Thrombocytopenia, unspecified: Secondary | ICD-10-CM

## 2020-12-10 DIAGNOSIS — E669 Obesity, unspecified: Secondary | ICD-10-CM

## 2020-12-10 MED ORDER — METFORMIN HCL 500 MG PO TABS: 1000.0000 mg | ORAL_TABLET | Freq: Two times a day (BID) | ORAL | 3 refills | Status: DC

## 2020-12-10 MED ORDER — FREESTYLE LIBRE 2 SENSOR MISC: 1.0000 | Freq: Four times a day (QID) | 4 refills | Status: DC

## 2020-12-10 MED ORDER — FREESTYLE LIBRE 2 READER DEVI: 1.0000 | Freq: Four times a day (QID) | 1 refills | Status: DC

## 2020-12-10 MED ORDER — OZEMPIC (0.25 OR 0.5 MG/DOSE) 2 MG/1.5ML ~~LOC~~ SOPN: 0.2500 mg | PEN_INJECTOR | SUBCUTANEOUS | 0 refills | Status: AC

## 2020-12-10 NOTE — Progress Notes (Signed)
Subjective:  Patient ID: Steven Osborne, male    DOB: October 28, 1940, 80 y.o.   MRN: [id number]  Patient Care Team: Sonny Masters, FNP as PCP - General (Family Medicine)   Chief Complaint:  New Patient (Initial Visit) (Establish care/Diabetes management)   HPI: Steven Osborne is a 80 y.o. male presenting on 12/10/2020 for New Patient (Initial Visit) (Establish care/Diabetes management)  Patient is a pleasant 80 year old Caucasian male with who presents today to establish care with new PCP and for management of his diabetes.  He has a complex and complicated medical history.  He has uncontrolled type 2 diabetes with endorgan damage including diabetic retinopathy of left eye, absolute glaucoma of right eye, cardiovascular disease with history of non-STEMI and CABG, hyperlipidemia, obesity, and chronic kidney disease.  He also has chronic pain in his back for which he is followed by pain management.  He does follow with cardiology on a regular basis.  He has not seen an endocrinologist in years and reports he has been managing his diabetes on his own.  He was on metformin and Novolin R.  He took it upon himself to stop metformin and switch to 70/30.  He states his blood sugars are running in the 2-300 range.  He has been taking anywhere from 80-100 units of 70/30 daily.  He has been receiving [facility] and was just seen on 12/04/2020.  Labs during this visit revealed an A1c of 8.7, normal CBC, total cholesterol 168, triglycerides 184, HDL 32, LDL 104, glucose 315, BUN 21, creatinine 1.55, GFR 45, and normal liver function.  He reports that he presents today because one of his friends comes our office and reports good control of their diabetes here and he would like someone to assist with his diabetes.  He does not have a continuous glucose monitoring device and would like one.  He does see an ophthalmologist on a regular basis.  He has not seen a podiatrist.  Feet are in great shape  without any indications of diabetic neuropathy.  Relevant past medical, surgical, family, and social history reviewed and updated as indicated.  Allergies and medications reviewed and updated. Data reviewed: Chart in Epic.   Past Medical History:  Diagnosis Date   ARTERIOSCLEROSIS 02/10/2010   CAD 02/25/2010   DIABETES TYPE 2 02/10/2010   Glaucoma    right eye   HYPERLIPIDEMIA NOS 02/10/2010   HYPERTENSION 02/10/2010   INTERMITTENT CLAUDICATION 02/10/2010   MI, old     Past Surgical History:  Procedure Laterality Date   BIOPSY  12/18/2017   Procedure: BIOPSY;  Surgeon: West Bali, MD;  [facility];  Service: Endoscopy;;   CORONARY ARTERY BYPASS GRAFT  2005   4 vessel bypass    CORONARY STENT INTERVENTION N/A 04/09/2020   Procedure: CORONARY STENT INTERVENTION;  Surgeon: Yvonne Kendall, MD;  [facility];  Service: Cardiovascular;  Laterality: N/A;  OM-2   ESOPHAGOGASTRODUODENOSCOPY N/A 12/18/2017   Procedure: ESOPHAGOGASTRODUODENOSCOPY (EGD);  Surgeon: West Bali, MD;  Location: [facility];  Service: Endoscopy;  Laterality: N/A;   INTRAVASCULAR IMAGING/OCT N/A 04/09/2020   Procedure: INTRAVASCULAR IMAGING/OCT;  Surgeon: Yvonne Kendall, MD;  Location: [facility];  Service: Cardiovascular;  Laterality: N/A;   LEFT HEART CATH AND CORS/GRAFTS ANGIOGRAPHY N/A 04/09/2020   Procedure: LEFT HEART CATH AND CORS/GRAFTS ANGIOGRAPHY;  Surgeon: Yvonne Kendall, MD;  [facility];  Service: Cardiovascular;  Laterality: N/A;  Social History   Socioeconomic History   Marital status: Divorced    Spouse name: Not on file   Number of children: Not on file   Years of education: Not on file   Highest education level: Not on file  Occupational History   Not on file  Tobacco Use   Smoking status: Former    Types: Cigars   Smokeless tobacco: Former    Types: Chew    Quit date: 02/21/2014  Vaping Use   Vaping Use: Never used   Substance and Sexual Activity   Alcohol use: No    Comment: as a teenager to late 20s, none since    Drug use: No   Sexual activity: Not on file  Other Topics Concern   Not on file  Social History Narrative   Divorced, lives alone   Regular exercise-yes   Social Determinants of Health   Financial Resource Strain: Not on file  Food Insecurity: Not on file  Transportation Needs: Not on file  Physical Activity: Not on file  Stress: Not on file  Social Connections: Not on file  Intimate Partner Violence: Not on file    Outpatient Encounter Medications as of 12/10/2020  Medication Sig   amLODipine (NORVASC) 10 MG tablet TAKE 1 TABLET EVERY DAY, NEED FOLLOW UP WITH [facility] FOR MORE REFILLS   aspirin EC 81 MG tablet Take 81 mg by mouth daily.   atorvastatin (LIPITOR) 20 MG tablet Take 20 mg by mouth daily.   carvedilol (COREG) 25 MG tablet Take 25 mg by mouth 2 (two) times daily.   citalopram (CELEXA) 20 MG tablet Take 20 mg by mouth daily.   clopidogrel (PLAVIX) 75 MG tablet Take 1 tablet (75 mg total) by mouth daily.   Continuous Blood Gluc Receiver (FREESTYLE LIBRE 2 READER) DEVI 1 Device by Does not apply route in the morning, at noon, in the evening, and at bedtime.   Continuous Blood Gluc Sensor (FREESTYLE LIBRE 2 SENSOR) MISC 1 Device by Does not apply route 4 (four) times daily.   furosemide (LASIX) 40 MG tablet Take 40 mg by mouth daily.   hydrALAZINE (APRESOLINE) 50 MG tablet Take 1 tablet (50 mg total) by mouth 3 (three) times daily.   insulin aspart protamine- aspart (NOVOLOG MIX 70/30) (70-30) 100 UNIT/ML injection Inject 40 Units into the skin daily.   isosorbide mononitrate (IMDUR) 30 MG 24 hr tablet Take 30 mg by mouth daily.    oxyCODONE (ROXICODONE) 15 MG immediate release tablet Take 15 mg by mouth 4 (four) times daily as needed.   pantoprazole (PROTONIX) 40 MG tablet Take 1 tablet (40 mg total) by mouth 2 (two) times daily before a meal.    Semaglutide,0.25 or 0.5MG /DOS, (OZEMPIC, 0.25 OR 0.5 MG/DOSE,) 2 MG/1.5ML SOPN Inject 0.25 mg into the skin once a week for 4 doses.   vitamin C (ASCORBIC ACID) 500 MG tablet Take 500 mg by mouth daily.   vitamin E 180 MG (400 UNITS) capsule Take 400 Units by mouth daily.   [DISCONTINUED] insulin NPH Human (NOVOLIN N) 100 UNIT/ML injection Inject 0.2 mLs (20 Units total) into the skin 2 (two) times daily before a meal.   metFORMIN (GLUCOPHAGE) 500 MG tablet Take 2 tablets (1,000 mg total) by mouth 2 (two) times daily with a meal.   [DISCONTINUED] cephALEXin (KEFLEX) 500 MG capsule Take 1 capsule (500 mg total) by mouth 2 (two) times daily.   [DISCONTINUED] clopidogrel (PLAVIX) 75 MG tablet Take 75 mg  by mouth daily.   [DISCONTINUED] clopidogrel (PLAVIX) 75 MG tablet TAKE 1 TABLET (75 MG TOTAL) BY MOUTH DAILY.   [DISCONTINUED] dorzolamide-timolol (COSOPT) 22.3-6.8 MG/ML ophthalmic solution Place 1 drop into both eyes in the morning and at bedtime.   [DISCONTINUED] LORazepam (ATIVAN) 0.5 MG tablet Take 0.5 mg by mouth 2 (two) times daily.   [DISCONTINUED] metFORMIN (GLUCOPHAGE) 500 MG tablet Take 1 tablet (500 mg total) by mouth daily.   [DISCONTINUED] Omega-3 Fatty Acids (FISH OIL PO) Take 1 capsule by mouth daily.   [DISCONTINUED] QUEtiapine (SEROQUEL) 25 MG tablet Take 1 tablet by mouth in the morning and at bedtime.   No facility-administered encounter medications on file as of 12/10/2020.    Allergies  Allergen Reactions   Crestor [Rosuvastatin Calcium]     myalgia    Review of Systems  Constitutional:  Negative for activity change, appetite change, chills, diaphoresis, fatigue, fever and unexpected weight change.  HENT: Negative.    Eyes:  Positive for visual disturbance. Negative for photophobia, pain, discharge, redness and itching.  Respiratory:  Negative for cough, chest tightness and shortness of breath.   Cardiovascular:  Positive for chest pain (infrequent). Negative for  palpitations and leg swelling.       Intermittent claudication  Gastrointestinal:  Negative for abdominal pain, anal bleeding, blood in stool, constipation, diarrhea, nausea and vomiting.  Endocrine: Negative.  Negative for cold intolerance, heat intolerance, polydipsia, polyphagia and polyuria.  Genitourinary:  Negative for decreased urine volume, difficulty urinating, dysuria, frequency and urgency.  Musculoskeletal:  Negative for arthralgias and myalgias.  Skin: Negative.   Allergic/Immunologic: Negative.   Neurological:  Negative for dizziness, tremors, seizures, syncope, facial asymmetry, speech difficulty, weakness, light-headedness, numbness and headaches.  Hematological: Negative.   Psychiatric/Behavioral:  Negative for confusion, hallucinations, sleep disturbance and suicidal ideas.   All other systems reviewed and are negative.      Objective:  BP (!) 142/70   Pulse (!) 50   Temp 98 F (36.7 C)   Ht 5\' 10"  (1.778 m)   Wt 220 lb (99.8 kg)   SpO2 95%   BMI 31.57 kg/m    Wt Readings from Last 3 Encounters:  12/10/20 220 lb (99.8 kg)  05/11/20 198 lb 13.7 oz (90.2 kg)  04/10/20 198 lb 13.7 oz (90.2 kg)    Physical Exam Vitals and nursing note reviewed.  Constitutional:      General: He is not in acute distress.    Appearance: Normal appearance. He is well-developed and well-groomed. He is morbidly obese. He is not ill-appearing, toxic-appearing or diaphoretic.  HENT:     Head: Normocephalic and atraumatic.     Jaw: There is normal jaw occlusion.     Right Ear: Hearing normal.     Left Ear: Hearing normal.     Nose: Nose normal.     Mouth/Throat:     Lips: Pink.     Mouth: Mucous membranes are moist.     Pharynx: Oropharynx is clear. Uvula midline.  Eyes:     General: Lids are normal.     Conjunctiva/sclera: Conjunctivae normal.     Left eye: Left conjunctiva is not injected. No chemosis, exudate or hemorrhage.    Pupils: Pupils are equal, round, and reactive  to light.     Comments: Right eye opacity, blindness  Neck:     Thyroid: No thyroid mass, thyromegaly or thyroid tenderness.     Vascular: No carotid bruit or JVD.     Trachea: Trachea  and phonation normal.  Cardiovascular:     Rate and Rhythm: Regular rhythm. Bradycardia present.     Chest Wall: PMI is not displaced.     Pulses: Normal pulses.     Heart sounds: Normal heart sounds. No murmur heard.   No friction rub. No gallop.  Pulmonary:     Effort: Pulmonary effort is normal. No respiratory distress.     Breath sounds: Normal breath sounds. No wheezing.  Abdominal:     General: Abdomen is protuberant. Bowel sounds are normal. There is no distension or abdominal bruit.     Palpations: Abdomen is soft. There is no hepatomegaly or splenomegaly.     Tenderness: There is no abdominal tenderness. There is no right CVA tenderness or left CVA tenderness.     Hernia: No hernia is present.  Musculoskeletal:        General: Normal range of motion.     Cervical back: Normal range of motion and neck supple.     Right lower leg: No edema.     Left lower leg: No edema.  Lymphadenopathy:     Cervical: No cervical adenopathy.  Skin:    General: Skin is warm and dry.     Capillary Refill: Capillary refill takes less than 2 seconds.     Coloration: Skin is not cyanotic, jaundiced or pale.     Findings: No rash.  Neurological:     General: No focal deficit present.     Mental Status: He is alert and oriented to person, place, and time.     Cranial Nerves: No cranial nerve deficit.     Sensory: Sensation is intact. No sensory deficit.     Motor: Motor function is intact. No weakness.     Coordination: Coordination is intact. Coordination normal.     Gait: Gait is intact. Gait normal.     Deep Tendon Reflexes: Reflexes are normal and symmetric. Reflexes normal.  Psychiatric:        Attention and Perception: Attention and perception normal.        Mood and Affect: Mood and affect normal.         Speech: Speech normal.        Behavior: Behavior normal. Behavior is cooperative.        Thought Content: Thought content normal.        Cognition and Memory: Cognition and memory normal.        Judgment: Judgment normal.    Results for orders placed or performed during the hospital encounter of 04/07/20  Resp Panel by RT-PCR (Flu A&B, Covid) Nasopharyngeal Swab   Specimen: Nasopharyngeal Swab; Nasopharyngeal(NP) swabs in vial transport medium  Result Value Ref Range   SARS Coronavirus 2 by RT PCR NEGATIVE NEGATIVE   Influenza A by PCR NEGATIVE NEGATIVE   Influenza B by PCR NEGATIVE NEGATIVE  CBC with Differential/Platelet  Result Value Ref Range   WBC 7.4 4.0 - 10.5 K/uL   RBC 4.85 4.22 - 5.81 MIL/uL   Hemoglobin 14.3 13.0 - 17.0 g/dL   HCT 40.9 81.1 - 91.4 %   MCV 86.4 80.0 - 100.0 fL   MCH 29.5 26.0 - 34.0 pg   MCHC 34.1 30.0 - 36.0 g/dL   RDW 78.2 95.6 - 21.3 %   Platelets 159 150 - 400 K/uL   nRBC 0.0 0.0 - 0.2 %   Neutrophils Relative % 58 %   Neutro Abs 4.3 1.7 - 7.7 K/uL   Lymphocytes Relative 22 %  Lymphs Abs 1.6 0.7 - 4.0 K/uL   Monocytes Relative 10 %   Monocytes Absolute 0.7 0.1 - 1.0 K/uL   Eosinophils Relative 9 %   Eosinophils Absolute 0.7 (H) 0.0 - 0.5 K/uL   Basophils Relative 1 %   Basophils Absolute 0.1 0.0 - 0.1 K/uL   Immature Granulocytes 0 %   Abs Immature Granulocytes 0.02 0.00 - 0.07 K/uL  Basic metabolic panel  Result Value Ref Range   Sodium 136 135 - 145 mmol/L   Potassium 3.8 3.5 - 5.1 mmol/L   Chloride 106 98 - 111 mmol/L   CO2 23 22 - 32 mmol/L   Glucose, Bld 100 (H) 70 - 99 mg/dL   BUN 27 (H) 8 - 23 mg/dL   Creatinine, Ser 1.61 (H) 0.61 - 1.24 mg/dL   Calcium 8.8 (L) 8.9 - 10.3 mg/dL   GFR, Estimated 38 (L) >60 mL/min   Anion gap 7 5 - 15  Brain natriuretic peptide  Result Value Ref Range   B Natriuretic Peptide 265.0 (H) 0.0 - 100.0 pg/mL  Heparin level (unfractionated)  Result Value Ref Range   Heparin Unfractionated 0.32  0.30 - 0.70 IU/mL  Magnesium  Result Value Ref Range   Magnesium 1.8 1.7 - 2.4 mg/dL  Phosphorus  Result Value Ref Range   Phosphorus 2.4 (L) 2.5 - 4.6 mg/dL  Hemoglobin W9U  Result Value Ref Range   Hgb A1c MFr Bld 7.8 (H) 4.8 - 5.6 %   Mean Plasma Glucose 177 mg/dL  Basic metabolic panel  Result Value Ref Range   Sodium 136 135 - 145 mmol/L   Potassium 3.7 3.5 - 5.1 mmol/L   Chloride 104 98 - 111 mmol/L   CO2 22 22 - 32 mmol/L   Glucose, Bld 143 (H) 70 - 99 mg/dL   BUN 25 (H) 8 - 23 mg/dL   Creatinine, Ser 0.45 (H) 0.61 - 1.24 mg/dL   Calcium 8.9 8.9 - 40.9 mg/dL   GFR, Estimated 51 (L) >60 mL/min   Anion gap 10 5 - 15  Heparin level (unfractionated)  Result Value Ref Range   Heparin Unfractionated 0.19 (L) 0.30 - 0.70 IU/mL  CBC  Result Value Ref Range   WBC 8.1 4.0 - 10.5 K/uL   RBC 4.59 4.22 - 5.81 MIL/uL   Hemoglobin 13.6 13.0 - 17.0 g/dL   HCT 81.1 (L) 91.4 - 78.2 %   MCV 84.3 80.0 - 100.0 fL   MCH 29.6 26.0 - 34.0 pg   MCHC 35.1 30.0 - 36.0 g/dL   RDW 95.6 21.3 - 08.6 %   Platelets 145 (L) 150 - 400 K/uL   nRBC 0.0 0.0 - 0.2 %  Comprehensive metabolic panel  Result Value Ref Range   Sodium 135 135 - 145 mmol/L   Potassium 3.9 3.5 - 5.1 mmol/L   Chloride 106 98 - 111 mmol/L   CO2 19 (L) 22 - 32 mmol/L   Glucose, Bld 200 (H) 70 - 99 mg/dL   BUN 21 8 - 23 mg/dL   Creatinine, Ser 5.78 (H) 0.61 - 1.24 mg/dL   Calcium 8.8 (L) 8.9 - 10.3 mg/dL   Total Protein 6.3 (L) 6.5 - 8.1 g/dL   Albumin 3.2 (L) 3.5 - 5.0 g/dL   AST 17 15 - 41 U/L   ALT 15 0 - 44 U/L   Alkaline Phosphatase 65 38 - 126 U/L   Total Bilirubin 1.1 0.3 - 1.2 mg/dL   GFR, Estimated 48 (  L) >60 mL/min   Anion gap 10 5 - 15  Protime-INR  Result Value Ref Range   Prothrombin Time 14.3 11.4 - 15.2 seconds   INR 1.2 0.8 - 1.2  APTT  Result Value Ref Range   aPTT 74 (H) 24 - 36 seconds  Heparin level (unfractionated)  Result Value Ref Range   Heparin Unfractionated 0.34 0.30 - 0.70 IU/mL   Glucose, capillary  Result Value Ref Range   Glucose-Capillary 212 (H) 70 - 99 mg/dL  Glucose, capillary  Result Value Ref Range   Glucose-Capillary 197 (H) 70 - 99 mg/dL  Lipid panel  Result Value Ref Range   Cholesterol 257 (H) 0 - 200 mg/dL   Triglycerides 496 (H) <150 mg/dL   HDL 30 (L) >75 mg/dL   Total CHOL/HDL Ratio 8.6 RATIO   VLDL 44 (H) 0 - 40 mg/dL   LDL Cholesterol 916 (H) 0 - 99 mg/dL  Glucose, capillary  Result Value Ref Range   Glucose-Capillary 201 (H) 70 - 99 mg/dL  Basic metabolic panel  Result Value Ref Range   Sodium 137 135 - 145 mmol/L   Potassium 3.7 3.5 - 5.1 mmol/L   Chloride 108 98 - 111 mmol/L   CO2 20 (L) 22 - 32 mmol/L   Glucose, Bld 231 (H) 70 - 99 mg/dL   BUN 17 8 - 23 mg/dL   Creatinine, Ser 3.84 (H) 0.61 - 1.24 mg/dL   Calcium 8.7 (L) 8.9 - 10.3 mg/dL   GFR, Estimated 48 (L) >60 mL/min   Anion gap 9 5 - 15  CBC  Result Value Ref Range   WBC 8.1 4.0 - 10.5 K/uL   RBC 4.37 4.22 - 5.81 MIL/uL   Hemoglobin 12.6 (L) 13.0 - 17.0 g/dL   HCT 66.5 (L) 99.3 - 57.0 %   MCV 85.6 80.0 - 100.0 fL   MCH 28.8 26.0 - 34.0 pg   MCHC 33.7 30.0 - 36.0 g/dL   RDW 17.7 93.9 - 03.0 %   Platelets 131 (L) 150 - 400 K/uL   nRBC 0.0 0.0 - 0.2 %  Glucose, capillary  Result Value Ref Range   Glucose-Capillary 286 (H) 70 - 99 mg/dL  Glucose, capillary  Result Value Ref Range   Glucose-Capillary 231 (H) 70 - 99 mg/dL  Glucose, capillary  Result Value Ref Range   Glucose-Capillary 254 (H) 70 - 99 mg/dL  Glucose, capillary  Result Value Ref Range   Glucose-Capillary 317 (H) 70 - 99 mg/dL  CBG monitoring, ED  Result Value Ref Range   Glucose-Capillary 116 (H) 70 - 99 mg/dL  CBG monitoring, ED  Result Value Ref Range   Glucose-Capillary 145 (H) 70 - 99 mg/dL  POCT Activated clotting time  Result Value Ref Range   Activated Clotting Time 297 seconds  POCT Activated clotting time  Result Value Ref Range   Activated Clotting Time 380 seconds   ECHOCARDIOGRAM COMPLETE  Result Value Ref Range   Weight 3,270.4 oz   Height 70 in   BP 188/94 mmHg   S' Lateral 3.70 cm   Area-P 1/2 3.91 cm2  Troponin I (High Sensitivity)  Result Value Ref Range   Troponin I (High Sensitivity) 288 (HH) <18 ng/L  Troponin I (High Sensitivity)  Result Value Ref Range   Troponin I (High Sensitivity) 286 (HH) <18 ng/L       Pertinent labs & imaging results that were available during my care of the patient were reviewed by me  and considered in my medical decision making.  Assessment & Plan:  Deryk was seen today for new patient (initial visit).  Diagnoses and all orders for this visit:  Type 2 diabetes mellitus with other specified complication, with long-term current use of insulin (HCC) A1C 8.7 12/04/2020. Will restart metformin and add Ozempic. Cut dose of 70/30 to 40 units per day, will decrease further at next visit. Medication/treatment compliance discussed in detail. Libre device ordered. Follow up in 4 weeks or sooner if needed.  -     Continuous Blood Gluc Sensor (FREESTYLE LIBRE 2 SENSOR) MISC; 1 Device by Does not apply route 4 (four) times daily. -     Continuous Blood Gluc Receiver (FREESTYLE LIBRE 2 READER) DEVI; 1 Device by Does not apply route in the morning, at noon, in the evening, and at bedtime. -     Semaglutide,0.25 or 0.5MG /DOS, (OZEMPIC, 0.25 OR 0.5 MG/DOSE,) 2 MG/1.5ML SOPN; Inject 0.25 mg into the skin once a week for 4 doses. -     metFORMIN (GLUCOPHAGE) 500 MG tablet; Take 2 tablets (1,000 mg total) by mouth 2 (two) times daily with a meal. -     AMB Referral to [facility]ordin[facility]  Hypertension associated with diabetes (HCC) Fairly controlled today. Will adjust regimen at next visit if warranted.  -     AMB Referral to [facility] Coordin[facility]  Moderate nonproliferative diabetic retinopathy of left eye with macular edema associated with type 2 diabetes mellitus (HCC) Followed by ophthalmology on a regular  basis.  -     AMB Referral to [facility] Coordinaton  CKD stage 3 due to type 2 diabetes mellitus (HCC) Recent creatinine 1.55. -     AMB Referral to [facility] Coordinaton  Absolute glaucoma, right eye Followed by ophthalmology on a regular basis.  -     AMB Referral to [facility] Coordinaton  Hyperlipidemia associated with type 2 diabetes mellitus (HCC) Recent lipid panel acceptable.  -     AMB Referral to [facility]  Thrombocytopenia (HCC) Recent platelets normal. -     AMB Referral to [facility] Coordinaton  Chronic systolic CHF (congestive heart failure) (HCC) History of non-ST elevation myocardial infarction (NSTEMI) History of coronary angioplasty with insertion of stent Hx of CABG Followed by cardiology on a regular basis.   Obesity (BMI 30.0-34.9) Diet and exercise encouraged.   Need for immunization against influenza -     Flu Vaccine QUAD High Dose(Fluad)    Continue all other maintenance medications.  Follow up plan: Return in about 4 weeks (around 01/07/2021), or if symptoms worsen or fail to improve, for DM.   Continue healthy lifestyle choices, including diet (rich in fruits, vegetables, and lean proteins, and low in salt and simple carbohydrates) and exercise (at least 30 minutes of moderate physical activity daily).  Educational handout given for DM  The above assessment and management plan was discussed with the patient. The patient verbalized understanding of and has agreed to the management plan. Patient is aware to call the clinic if they develop any new symptoms or if symptoms persist or worsen. Patient is aware when to return to the clinic for a follow-up visit. Patient educated on when it is appropriate to go to the emergency department.   Kari Baars, FNP-C [facility] [phone]

## 2020-12-10 NOTE — Patient Instructions (Signed)

## 2020-12-11 ENCOUNTER — Telehealth: Payer: Self-pay | Admitting: Family Medicine

## 2020-12-11 NOTE — Telephone Encounter (Signed)
Pt states the Ozempic Rx that was sent in for him is costing him $90 which is too much money for pt.  Pt also says he cant get the Metformin Rx because his insurance wont pay for it.  Please advise and call patient.

## 2020-12-14 ENCOUNTER — Other Ambulatory Visit: Payer: Self-pay | Admitting: *Deleted

## 2020-12-14 DIAGNOSIS — E1169 Type 2 diabetes mellitus with other specified complication: Secondary | ICD-10-CM

## 2020-12-14 MED ORDER — METFORMIN HCL 500 MG PO TABS: 1000.0000 mg | ORAL_TABLET | Freq: Two times a day (BID) | ORAL | 3 refills | Status: DC

## 2020-12-15 ENCOUNTER — Telehealth: Payer: Self-pay

## 2020-12-15 NOTE — Telephone Encounter (Signed)
Burley Saver!  We can definitely get him patient assistance for Ozempic--it will take about 4 weeks to get  Insurance should be paying for the metformin so I'll have to look into that  Amber, can you schedule him with me (tell patient to bring household financial proof of income) for medication assistance CCM first available

## 2020-12-15 NOTE — Chronic Care Management (AMB) (Signed)
  Chronic Care Management   Outreach Note  12/15/2020 Name: Steven Osborne MRN: [id number] DOB: Mar 18, 1940  Steven Osborne is a 80 y.o. year old male who is a primary care patient of Rakes, Doralee Albino, FNP. I reached out to Steven Osborne by phone today in response to a referral sent by [name] primary care provider.  An unsuccessful telephone outreach was attempted today. The patient was referred to the case management team for assistance with care management and care coordination.   Follow Up Plan: A HIPAA compliant phone message was left for the patient providing contact information and requesting a return call.  The care management team will reach out to the patient again over the next 7 days.  If patient returns call to provider office, please advise to call Embedded Care Management Care Guide Steven Osborne * at [phone]*  Steven Osborne, RMA Care Guide, Embedded Care Coordination [facility]  [place], [place] [address] Direct Dial: [phone] [email or web address]

## 2020-12-25 NOTE — Chronic Care Management (AMB) (Signed)
  Chronic Care Management   Outreach Note  12/25/2020 Name: Steven Osborne MRN: [id number] DOB: 1940-12-20  Steven Osborne is a 80 y.o. year old male who is a primary care patient of Rakes, Doralee Albino, FNP. I reached out to Sheryle Hail by phone today in response to a referral sent by [name] primary care provider.  A second unsuccessful telephone outreach was attempted today. The patient was referred to the case management team for assistance with care management and care coordination.   Follow Up Plan: A HIPAA compliant phone message was left for the patient providing contact information and requesting a return call.  The care management team will reach out to the patient again over the next 3 days.  If patient returns call to provider office, please advise to call Embedded Care Management Care Guide Penne Lash at [phone]  Penne Lash, RMA Care Guide, Embedded Care Coordination [facility]  [place], [place] [address] Direct Dial: [phone] [email or web address]: [email or web address]

## 2020-12-29 ENCOUNTER — Other Ambulatory Visit: Payer: Self-pay

## 2020-12-29 ENCOUNTER — Ambulatory Visit (INDEPENDENT_AMBULATORY_CARE_PROVIDER_SITE_OTHER): Payer: Medicare HMO | Admitting: Pharmacist

## 2020-12-29 DIAGNOSIS — E1169 Type 2 diabetes mellitus with other specified complication: Secondary | ICD-10-CM | POA: Diagnosis not present

## 2020-12-29 DIAGNOSIS — Z794 Long term (current) use of insulin: Secondary | ICD-10-CM

## 2020-12-29 NOTE — Progress Notes (Signed)
    12/29/2020 Name: Steven Osborne MRN: [id number] DOB: 1940-11-11   S:  15 yoM Presents for diabetes evaluation, education, and management Patient was referred and last seen by Primary Care Provider on 12/10/20.  Insurance coverage/medication affordability: [employer] medicare  Patient reports adherence with medications. Current diabetes medications include: metformin, insulin mix Current hypertension medications include: hydral, coreg, amlodipine Goal 130/80 Current hyperlipidemia medications include: atorvastatin    Patient denies hypoglycemic events.   Patient reported dietary habits: Eats 3 meals/day Discussed meal planning options and Plate method for healthy eating Avoid sugary drinks and desserts Incorporate balanced protein, non starchy veggies, 1 serving of carbohydrate with each meal Increase water intake Increase physical activity as able  Patient reports nocturia (nighttime urination).  Patient reports neuropathy (nerve pain).  Patient denies visual changes.  Patient reports self foot exams.    O:  Lab Results  Component Value Date   HGBA1C 7.8 (H) 04/08/2020    Lipid Panel     Component Value Date/Time   CHOL 257 (H) 04/09/2020 0210   TRIG 222 (H) 04/09/2020 0210   HDL 30 (L) 04/09/2020 0210   CHOLHDL 8.6 04/09/2020 0210   VLDL 44 (H) 04/09/2020 0210   LDLCALC 183 (H) 04/09/2020 0210   LDLDIRECT 129.5 01/01/2014 1358     Home fasting blood sugars: <160  2 hour post-meal/random blood sugars: n/a.    Clinical Atherosclerotic Cardiovascular Disease (ASCVD): Yes   The ASCVD Risk score (Arnett DK, et al., 2019) failed to calculate for the following reasons:   The patient has a prior MI or stroke diagnosis    A/P:  Diabetes t2dm currently uncontrolled.  Patient is unable to obtain ozempic.  We will assist him with applying for patient assistance.    -START GLP-1 Ozempic (generic name: semglutide)  Instructions: Inject 0.25 mg into the skin weekly  for 4 weeks, then increase to 0.5mg  weekly thereafter (as tolerated) Denies personal and family history of Medullary thyroid cancer (MTC) Will apply for [employer] patient assistance program  Continue metformin  Continue insulin  Libre 2 CGM rx sent [facility] via [facility]  -Extensively discussed pathophysiology of diabetes, recommended lifestyle interventions, dietary effects on blood sugar control  -Counseled on s/sx of and management of hypoglycemia  -Next A1C anticipated 3 months.     Written patient instructions provided.  Total time in face to face counseling 25 minutes.    Kieth Brightly, PharmD, BCPS Clinical Pharmacist, [facility]  II Phone [phone]

## 2020-12-30 ENCOUNTER — Telehealth: Payer: Self-pay | Admitting: Family Medicine

## 2020-12-30 NOTE — Telephone Encounter (Signed)
Explained to pt that Dr. Eden Emms the cardiologist prescribed this medication and he has not been refilling this since he has not been back to see him, gave pt Dr. Fabio Bering office number to call and schedule an appt.

## 2020-12-30 NOTE — Telephone Encounter (Signed)
LMTCB

## 2020-12-30 NOTE — Telephone Encounter (Signed)
  Prescription Request  12/30/2020  What is the name of the medication or equipment? AMLODIPINE  Have you contacted your pharmacy to request a refill? YES  Which pharmacy would you like this sent to? [facility] [place]  Pt is completely out of this medicine and forgot to tell PCP about it. Needs refills sent to [facility] in [place] ASAP.

## 2020-12-31 ENCOUNTER — Telehealth: Payer: Self-pay | Admitting: Cardiovascular Disease

## 2020-12-31 MED ORDER — AMLODIPINE BESYLATE 10 MG PO TABS: ORAL_TABLET | ORAL | 0 refills | Status: DC

## 2020-12-31 NOTE — Telephone Encounter (Signed)
Brad, [place] - 12/31/2020  1:42 PM Wendall Stade, MD [phone])  Sent: Thu December 31, 2020  4:45 PM  To:           Message  Yes    Cathy/refills as indicated above, please refill this med per Dr. Eden Emms. Thanks!

## 2020-12-31 NOTE — Telephone Encounter (Signed)
*  STAT* If patient is at the pharmacy, call can be transferred to refill team.   1. Which medications need to be refilled? (please list name of each medication and dose if known)  amLODipine (NORVASC) 10 MG tablet  2. Which pharmacy/location (including street and city if local pharmacy) is medication to be sent to? [facility] [place], [place] - [address]  3. Do they need a 30 day or 90 day supply? 90 day   Patient has an appointment 03/31/2021.

## 2020-12-31 NOTE — Telephone Encounter (Signed)
Steven Osborne or Dr. Eden Emms, pt has never been seen on the outpatient side before. Dr. Eden Emms you saw him in the ER back on 04/07/20, where amlodipine was started.  Last refill of this medicine was provided to the pt by CMA at our [place] office on 4/6, but clear instructions were the pt was to have outpatient follow-up with you or an APP for further refills of amlodipine, thereafter.  Pt no showed for those appts.   Pt is now scheduled to see Steven Osborne on 03/31/21 for follow-up and med refills.  Are you ok with him getting refills of this medication up until that visit, or refer to PCP? Please advise!

## 2020-12-31 NOTE — Telephone Encounter (Signed)
Pt has an upcoming appt on 03/31/21, pt has never been seen in office before. Pt had an appt in march 2022 with scott weaver, PA and no showed. Would Tereso Newcomer, PA like to refill this medication until appt time? Please address

## 2020-12-31 NOTE — Telephone Encounter (Signed)
Pt's medication was sent to pt's pharmacy as requested. Confirmation received.  °

## 2021-01-08 ENCOUNTER — Ambulatory Visit: Payer: Medicare HMO

## 2021-01-21 ENCOUNTER — Ambulatory Visit (INDEPENDENT_AMBULATORY_CARE_PROVIDER_SITE_OTHER): Payer: Medicare HMO | Admitting: Family Medicine

## 2021-01-21 ENCOUNTER — Encounter: Payer: Self-pay | Admitting: Family Medicine

## 2021-01-21 ENCOUNTER — Other Ambulatory Visit: Payer: Self-pay

## 2021-01-21 VITALS — BP 105/64 | HR 60 | Temp 97.2°F | Ht 70.0 in | Wt 207.0 lb

## 2021-01-21 DIAGNOSIS — Z794 Long term (current) use of insulin: Secondary | ICD-10-CM

## 2021-01-21 DIAGNOSIS — E1169 Type 2 diabetes mellitus with other specified complication: Secondary | ICD-10-CM | POA: Diagnosis not present

## 2021-01-21 LAB — GLUCOSE HEMOCUE WAIVED: Glu Hemocue Waived: 233 mg/dL — ABNORMAL HIGH (ref 70–99)

## 2021-01-21 MED ORDER — OZEMPIC (0.25 OR 0.5 MG/DOSE) 2 MG/1.5ML ~~LOC~~ SOPN: 0.5000 mg | PEN_INJECTOR | SUBCUTANEOUS | 2 refills | Status: DC

## 2021-01-21 NOTE — Progress Notes (Signed)
Subjective:  Patient ID: Steven Osborne, male    DOB: [birth year], 80 y.o.   MRN: [id number]  Patient Care Team: Baruch Gouty, FNP as PCP - General (Family Medicine)   Chief Complaint:  Diabetes   HPI: Steven Osborne is a 80 y.o. male presenting on 01/21/2021 for Diabetes   Pt presents today for T2DM follow up. He recently established with this office for better management of chronic medical conditions. His diabetic regimen was adjusted at initial visit and Ozempic was added. He has yet to get his Elenor Legato device but has been taking the weekly Ozempic. He states over the last few days his vision has worsened, states he is unable to see things up close. He has made an appointment to see his retinal specialist next week. No loss of vision, just worsening vision.   Diabetes He presents for his follow-up diabetic visit. He has type 2 diabetes mellitus. His disease course has been fluctuating. Pertinent negatives for hypoglycemia include no confusion, dizziness, headaches, hunger, mood changes, nervousness/anxiousness, pallor, seizures, sleepiness, speech difficulty, sweats or tremors. Associated symptoms include visual change. Pertinent negatives for diabetes include no blurred vision, no chest pain, no fatigue, no foot paresthesias, no foot ulcerations, no polydipsia, no polyphagia, no polyuria, no weakness and no weight loss. There are no hypoglycemic complications. Diabetic complications include heart disease, nephropathy, PVD and retinopathy. Risk factors for coronary artery disease include diabetes mellitus, dyslipidemia, family history, male sex, hypertension, obesity and sedentary lifestyle. Current diabetic treatment includes insulin injections and oral agent (monotherapy). He is compliant with treatment most of the time.     Relevant past medical, surgical, family, and social history reviewed and updated as indicated.  Allergies and medications reviewed and updated. Data reviewed: Chart in  Epic.   Past Medical History:  Diagnosis Date   ARTERIOSCLEROSIS 02/10/2010   CAD 02/25/2010   DIABETES TYPE 2 02/10/2010   Glaucoma    right eye   HYPERLIPIDEMIA NOS 02/10/2010   HYPERTENSION 02/10/2010   INTERMITTENT CLAUDICATION 02/10/2010   MI, old     Past Surgical History:  Procedure Laterality Date   BIOPSY  12/18/2017   Procedure: BIOPSY;  Surgeon: Danie Binder, MD;  [facility];  Service: Endoscopy;;   CORONARY ARTERY BYPASS GRAFT  2005   4 vessel bypass    CORONARY STENT INTERVENTION N/A 04/09/2020   Procedure: CORONARY STENT INTERVENTION;  Surgeon: Nelva Bush, MD;  Location: [place] [facility];  Service: Cardiovascular;  Laterality: N/A;  OM-2   ESOPHAGOGASTRODUODENOSCOPY N/A 12/18/2017   Procedure: ESOPHAGOGASTRODUODENOSCOPY (EGD);  Surgeon: Danie Binder, MD;  [facility];  Service: Endoscopy;  Laterality: N/A;   INTRAVASCULAR IMAGING/OCT N/A 04/09/2020   Procedure: INTRAVASCULAR IMAGING/OCT;  Surgeon: Nelva Bush, MD;  Location: [place] [facility];  Service: Cardiovascular;  Laterality: N/A;   LEFT HEART CATH AND CORS/GRAFTS ANGIOGRAPHY N/A 04/09/2020   Procedure: LEFT HEART CATH AND CORS/GRAFTS ANGIOGRAPHY;  Surgeon: Nelva Bush, MD;  Location: [place] [facility];  Service: Cardiovascular;  Laterality: N/A;    Social History   Socioeconomic History   Marital status: Divorced    Spouse name: Not on file   Number of children: Not on file   Years of education: Not on file   Highest education level: Not on file  Occupational History   Not on file  Tobacco Use   Smoking status: Former    Types: Cigars   Smokeless tobacco: Former    Types:  Sarina Ser    Quit date: 02/21/2014  Vaping Use   Vaping Use: Never used  Substance and Sexual Activity   Alcohol use: No    Comment: as a teenager to late 20s, none since    Drug use: No   Sexual activity: Not on file  Other Topics Concern   Not on file  Social History Narrative    Divorced, lives alone   Regular exercise-yes   Social Determinants of Health   Financial Resource Strain: Not on file  Food Insecurity: Not on file  Transportation Needs: Not on file  Physical Activity: Not on file  Stress: Not on file  Social Connections: Not on file  Intimate Partner Violence: Not on file    Outpatient Encounter Medications as of 01/21/2021  Medication Sig   amLODipine (NORVASC) 10 MG tablet TAKE 1 TABLET EVERY DAY.n please keep upcoming appt in February 2023 with Richardson Dopp, PA before anymore refills. Thank you Final Attempt   aspirin EC 81 MG tablet Take 81 mg by mouth daily.   atorvastatin (LIPITOR) 20 MG tablet Take 20 mg by mouth daily.   carvedilol (COREG) 25 MG tablet Take 25 mg by mouth 2 (two) times daily.   citalopram (CELEXA) 20 MG tablet Take 20 mg by mouth daily.   clopidogrel (PLAVIX) 75 MG tablet Take 1 tablet (75 mg total) by mouth daily.   Continuous Blood Gluc Receiver (FREESTYLE LIBRE 2 READER) DEVI 1 Device by Does not apply route in the morning, at noon, in the evening, and at bedtime.   Continuous Blood Gluc Sensor (FREESTYLE LIBRE 2 SENSOR) MISC 1 Device by Does not apply route 4 (four) times daily.   furosemide (LASIX) 40 MG tablet Take 40 mg by mouth daily.   hydrALAZINE (APRESOLINE) 50 MG tablet Take 1 tablet (50 mg total) by mouth 3 (three) times daily.   insulin aspart protamine- aspart (NOVOLOG MIX 70/30) (70-30) 100 UNIT/ML injection Inject 40 Units into the skin daily.   isosorbide mononitrate (IMDUR) 30 MG 24 hr tablet Take 30 mg by mouth daily.    metFORMIN (GLUCOPHAGE) 500 MG tablet Take 2 tablets (1,000 mg total) by mouth 2 (two) times daily with a meal.   oxyCODONE (ROXICODONE) 15 MG immediate release tablet Take 15 mg by mouth 4 (four) times daily as needed.   pantoprazole (PROTONIX) 40 MG tablet Take 1 tablet (40 mg total) by mouth 2 (two) times daily before a meal.   Semaglutide,0.25 or 0.5MG /DOS, (OZEMPIC, 0.25 OR 0.5  MG/DOSE,) 2 MG/1.5ML SOPN Inject 0.5 mg into the skin once a week.   vitamin C (ASCORBIC ACID) 500 MG tablet Take 500 mg by mouth daily.   vitamin E 180 MG (400 UNITS) capsule Take 400 Units by mouth daily.   [DISCONTINUED] Semaglutide,0.25 or 0.5MG /DOS, (OZEMPIC, 0.25 OR 0.5 MG/DOSE,) 2 MG/1.5ML SOPN Inject 0.25 mg into the skin once a week.   No facility-administered encounter medications on file as of 01/21/2021.    Allergies  Allergen Reactions   Crestor [Rosuvastatin Calcium]     myalgia    Review of Systems  Constitutional:  Negative for activity change, appetite change, chills, diaphoresis, fatigue, fever, unexpected weight change and weight loss.  Eyes:  Positive for visual disturbance. Negative for blurred vision.  Respiratory:  Negative for cough and shortness of breath.   Cardiovascular:  Negative for chest pain, palpitations and leg swelling.  Gastrointestinal:  Negative for abdominal pain.  Endocrine: Negative for polydipsia, polyphagia and polyuria.  Genitourinary:  Negative for decreased urine volume.  Skin:  Negative for pallor.  Neurological:  Negative for dizziness, tremors, seizures, syncope, facial asymmetry, speech difficulty, weakness, light-headedness, numbness and headaches.  Psychiatric/Behavioral:  Negative for confusion. The patient is not nervous/anxious.   All other systems reviewed and are negative.      Objective:  BP 105/64   Pulse 60   Temp (!) 97.2 F (36.2 C)   Ht 5\' 10"  (1.778 m)   Wt 207 lb (93.9 kg)   SpO2 94%   BMI 29.70 kg/m    Wt Readings from Last 3 Encounters:  01/21/21 207 lb (93.9 kg)  12/10/20 220 lb (99.8 kg)  05/11/20 198 lb 13.7 oz (90.2 kg)    Physical Exam Vitals and nursing note reviewed.  Constitutional:      General: He is not in acute distress.    Appearance: Normal appearance. He is obese. He is not ill-appearing, toxic-appearing or diaphoretic.  HENT:     Head: Normocephalic and atraumatic.     Mouth/Throat:      Mouth: Mucous membranes are moist.  Eyes:     Conjunctiva/sclera: Conjunctivae normal.     Pupils: Pupils are equal, round, and reactive to light.  Cardiovascular:     Rate and Rhythm: Normal rate and regular rhythm.     Heart sounds: Normal heart sounds.  Pulmonary:     Effort: Pulmonary effort is normal.     Breath sounds: Normal breath sounds.  Skin:    General: Skin is warm and dry.     Capillary Refill: Capillary refill takes less than 2 seconds.  Neurological:     General: No focal deficit present.     Mental Status: He is alert and oriented to person, place, and time.  Psychiatric:        Mood and Affect: Mood normal.        Behavior: Behavior normal.        Thought Content: Thought content normal.        Judgment: Judgment normal.    Results for orders placed or performed during the hospital encounter of 04/07/20  Resp Panel by RT-PCR (Flu A&B, Covid) Nasopharyngeal Swab   Specimen: Nasopharyngeal Swab; Nasopharyngeal(NP) swabs in vial transport medium  Result Value Ref Range   SARS Coronavirus 2 by RT PCR NEGATIVE NEGATIVE   Influenza A by PCR NEGATIVE NEGATIVE   Influenza B by PCR NEGATIVE NEGATIVE  CBC with Differential/Platelet  Result Value Ref Range   WBC 7.4 4.0 - 10.5 K/uL   RBC 4.85 4.22 - 5.81 MIL/uL   Hemoglobin 14.3 13.0 - 17.0 g/dL   HCT 41.9 39.0 - 52.0 %   MCV 86.4 80.0 - 100.0 fL   MCH 29.5 26.0 - 34.0 pg   MCHC 34.1 30.0 - 36.0 g/dL   RDW 13.8 11.5 - 15.5 %   Platelets 159 150 - 400 K/uL   nRBC 0.0 0.0 - 0.2 %   Neutrophils Relative % 58 %   Neutro Abs 4.3 1.7 - 7.7 K/uL   Lymphocytes Relative 22 %   Lymphs Abs 1.6 0.7 - 4.0 K/uL   Monocytes Relative 10 %   Monocytes Absolute 0.7 0.1 - 1.0 K/uL   Eosinophils Relative 9 %   Eosinophils Absolute 0.7 (H) 0.0 - 0.5 K/uL   Basophils Relative 1 %   Basophils Absolute 0.1 0.0 - 0.1 K/uL   Immature Granulocytes 0 %   Abs Immature Granulocytes 0.02 0.00 - 0.07 K/uL  Basic  metabolic panel   Result Value Ref Range   Sodium 136 135 - 145 mmol/L   Potassium 3.8 3.5 - 5.1 mmol/L   Chloride 106 98 - 111 mmol/L   CO2 23 22 - 32 mmol/L   Glucose, Bld 100 (H) 70 - 99 mg/dL   BUN 27 (H) 8 - 23 mg/dL   Creatinine, Ser 1.66 (H) 0.61 - 1.24 mg/dL   Calcium 8.8 (L) 8.9 - 10.3 mg/dL   GFR, Estimated 38 (L) >60 mL/min   Anion gap 7 5 - 15  Brain natriuretic peptide  Result Value Ref Range   B Natriuretic Peptide 265.0 (H) 0.0 - 100.0 pg/mL  Heparin level (unfractionated)  Result Value Ref Range   Heparin Unfractionated 0.32 0.30 - 0.70 IU/mL  Magnesium  Result Value Ref Range   Magnesium 1.8 1.7 - 2.4 mg/dL  Phosphorus  Result Value Ref Range   Phosphorus 2.4 (L) 2.5 - 4.6 mg/dL  Hemoglobin A6T  Result Value Ref Range   Hgb A1c MFr Bld 7.8 (H) 4.8 - 5.6 %   Mean Plasma Glucose 177 mg/dL  Basic metabolic panel  Result Value Ref Range   Sodium 136 135 - 145 mmol/L   Potassium 3.7 3.5 - 5.1 mmol/L   Chloride 104 98 - 111 mmol/L   CO2 22 22 - 32 mmol/L   Glucose, Bld 143 (H) 70 - 99 mg/dL   BUN 25 (H) 8 - 23 mg/dL   Creatinine, Ser 0.16 (H) 0.61 - 1.24 mg/dL   Calcium 8.9 8.9 - 01.0 mg/dL   GFR, Estimated 51 (L) >60 mL/min   Anion gap 10 5 - 15  Heparin level (unfractionated)  Result Value Ref Range   Heparin Unfractionated 0.19 (L) 0.30 - 0.70 IU/mL  CBC  Result Value Ref Range   WBC 8.1 4.0 - 10.5 K/uL   RBC 4.59 4.22 - 5.81 MIL/uL   Hemoglobin 13.6 13.0 - 17.0 g/dL   HCT 93.2 (L) 35.5 - 73.2 %   MCV 84.3 80.0 - 100.0 fL   MCH 29.6 26.0 - 34.0 pg   MCHC 35.1 30.0 - 36.0 g/dL   RDW 20.2 54.2 - 70.6 %   Platelets 145 (L) 150 - 400 K/uL   nRBC 0.0 0.0 - 0.2 %  Comprehensive metabolic panel  Result Value Ref Range   Sodium 135 135 - 145 mmol/L   Potassium 3.9 3.5 - 5.1 mmol/L   Chloride 106 98 - 111 mmol/L   CO2 19 (L) 22 - 32 mmol/L   Glucose, Bld 200 (H) 70 - 99 mg/dL   BUN 21 8 - 23 mg/dL   Creatinine, Ser 2.37 (H) 0.61 - 1.24 mg/dL   Calcium 8.8 (L) 8.9 -  10.3 mg/dL   Total Protein 6.3 (L) 6.5 - 8.1 g/dL   Albumin 3.2 (L) 3.5 - 5.0 g/dL   AST 17 15 - 41 U/L   ALT 15 0 - 44 U/L   Alkaline Phosphatase 65 38 - 126 U/L   Total Bilirubin 1.1 0.3 - 1.2 mg/dL   GFR, Estimated 48 (L) >60 mL/min   Anion gap 10 5 - 15  Protime-INR  Result Value Ref Range   Prothrombin Time 14.3 11.4 - 15.2 seconds   INR 1.2 0.8 - 1.2  APTT  Result Value Ref Range   aPTT 74 (H) 24 - 36 seconds  Heparin level (unfractionated)  Result Value Ref Range   Heparin Unfractionated 0.34 0.30 - 0.70 IU/mL  Glucose,  capillary  Result Value Ref Range   Glucose-Capillary 212 (H) 70 - 99 mg/dL  Glucose, capillary  Result Value Ref Range   Glucose-Capillary 197 (H) 70 - 99 mg/dL  Lipid panel  Result Value Ref Range   Cholesterol 257 (H) 0 - 200 mg/dL   Triglycerides 222 (H) <150 mg/dL   HDL 30 (L) >40 mg/dL   Total CHOL/HDL Ratio 8.6 RATIO   VLDL 44 (H) 0 - 40 mg/dL   LDL Cholesterol 183 (H) 0 - 99 mg/dL  Glucose, capillary  Result Value Ref Range   Glucose-Capillary 201 (H) 70 - 99 mg/dL  Basic metabolic panel  Result Value Ref Range   Sodium 137 135 - 145 mmol/L   Potassium 3.7 3.5 - 5.1 mmol/L   Chloride 108 98 - 111 mmol/L   CO2 20 (L) 22 - 32 mmol/L   Glucose, Bld 231 (H) 70 - 99 mg/dL   BUN 17 8 - 23 mg/dL   Creatinine, Ser 1.48 (H) 0.61 - 1.24 mg/dL   Calcium 8.7 (L) 8.9 - 10.3 mg/dL   GFR, Estimated 48 (L) >60 mL/min   Anion gap 9 5 - 15  CBC  Result Value Ref Range   WBC 8.1 4.0 - 10.5 K/uL   RBC 4.37 4.22 - 5.81 MIL/uL   Hemoglobin 12.6 (L) 13.0 - 17.0 g/dL   HCT 37.4 (L) 39.0 - 52.0 %   MCV 85.6 80.0 - 100.0 fL   MCH 28.8 26.0 - 34.0 pg   MCHC 33.7 30.0 - 36.0 g/dL   RDW 13.6 11.5 - 15.5 %   Platelets 131 (L) 150 - 400 K/uL   nRBC 0.0 0.0 - 0.2 %  Glucose, capillary  Result Value Ref Range   Glucose-Capillary 286 (H) 70 - 99 mg/dL  Glucose, capillary  Result Value Ref Range   Glucose-Capillary 231 (H) 70 - 99 mg/dL  Glucose,  capillary  Result Value Ref Range   Glucose-Capillary 254 (H) 70 - 99 mg/dL  Glucose, capillary  Result Value Ref Range   Glucose-Capillary 317 (H) 70 - 99 mg/dL  CBG monitoring, ED  Result Value Ref Range   Glucose-Capillary 116 (H) 70 - 99 mg/dL  CBG monitoring, ED  Result Value Ref Range   Glucose-Capillary 145 (H) 70 - 99 mg/dL  POCT Activated clotting time  Result Value Ref Range   Activated Clotting Time 297 seconds  POCT Activated clotting time  Result Value Ref Range   Activated Clotting Time 380 seconds  ECHOCARDIOGRAM COMPLETE  Result Value Ref Range   Weight 3,270.4 oz   Height 70 in   BP 188/94 mmHg   S' Lateral 3.70 cm   Area-P 1/2 3.91 cm2  Troponin I (High Sensitivity)  Result Value Ref Range   Troponin I (High Sensitivity) 288 (HH) <18 ng/L  Troponin I (High Sensitivity)  Result Value Ref Range   Troponin I (High Sensitivity) 286 (HH) <18 ng/L       Pertinent labs & imaging results that were available during my care of the patient were reviewed by me and considered in my medical decision making.  Assessment & Plan:  Kylle was seen today for diabetes.  Diagnoses and all orders for this visit:  Type 2 diabetes mellitus with other specified complication, with long-term current use of insulin ([place]) FSBS 233 in office today. Will increase Ozempic dosing to 0.5 mg weekly. Will repeat A1C in 7 weeks. Pt has follow up with retinal specialist next week for  worsening vision. Libre device has been ordered and pt will return to office for placement once it arrives.  -     Glucose Hemocue Waived -     Semaglutide,0.25 or 0.5MG /DOS, (OZEMPIC, 0.25 OR 0.5 MG/DOSE,) 2 MG/1.5ML SOPN; Inject 0.5 mg into the skin once a week.     Continue all other maintenance medications.  Follow up plan: Return in about 7 weeks (around 03/11/2021), or if symptoms worsen or fail to improve, for DM with labs.   Continue healthy lifestyle choices, including diet (rich in fruits,  vegetables, and lean proteins, and low in salt and simple carbohydrates) and exercise (at least 30 minutes of moderate physical activity daily).  Educational handout given for DM  The above assessment and management plan was discussed with the patient. The patient verbalized understanding of and has agreed to the management plan. Patient is aware to call the clinic if they develop any new symptoms or if symptoms persist or worsen. Patient is aware when to return to the clinic for a follow-up visit. Patient educated on when it is appropriate to go to the emergency department.   Monia Pouch, FNP-C [facility] [phone]

## 2021-01-21 NOTE — Patient Instructions (Signed)

## 2021-01-26 ENCOUNTER — Telehealth: Payer: Self-pay | Admitting: Family Medicine

## 2021-01-26 ENCOUNTER — Telehealth: Payer: Medicare HMO | Admitting: Pharmacist

## 2021-02-01 ENCOUNTER — Telehealth: Payer: Self-pay | Admitting: Family Medicine

## 2021-02-02 NOTE — Telephone Encounter (Signed)
Lmtcb.

## 2021-02-03 ENCOUNTER — Telehealth: Payer: Self-pay | Admitting: Family Medicine

## 2021-02-03 NOTE — Telephone Encounter (Signed)
Dorene Sorrow is returning call. [phone]

## 2021-02-03 NOTE — Telephone Encounter (Signed)
Spoke to nephew Dorene Sorrow. There is no DPR on file. I asked for better understanding of what is being asked.  He says patient, Steven Osborne, does not have a cell phone. He was asking what kind of phone he would need in order to read Mikes sensors. I explained android or I-phone have the capability to [employer].

## 2021-02-04 ENCOUNTER — Ambulatory Visit (INDEPENDENT_AMBULATORY_CARE_PROVIDER_SITE_OTHER): Payer: Medicare HMO | Admitting: Pharmacist

## 2021-02-04 DIAGNOSIS — E1169 Type 2 diabetes mellitus with other specified complication: Secondary | ICD-10-CM

## 2021-02-04 DIAGNOSIS — E785 Hyperlipidemia, unspecified: Secondary | ICD-10-CM

## 2021-02-04 NOTE — Progress Notes (Signed)
Chronic Care Management Pharmacy Note  02/04/2021 Name:  Steven Osborne MRN:  [id number] DOB:  November 07, 1940  Summary: T2DM  Recommendations/Changes made from today's visit:  Diabetes: New goal. Uncontrolled-A1C improved to 7.8%, GFR 48; current treatment:tresiba, new start ozempic, metformin;  Decrease insulin to tresiba 36 units daily due to hypoglycemia Goal to decrease insulin/d/c if able Decrease metformin due to GFR as able Continue ozempic-->dose to 0.13m sq weekly Denies personal and family history of Medullary thyroid cancer (MTC) Application submitted to [employer] patient assistance program --still awaiting shipment due to current back orders; order will ship to PCP office in 30-60 days; will continue to provide samples as needed and as able Current glucose readings: fasting glucose: <150; more recently having hypoglycemia, post prandial glucose: <200 Libre 2 CGM order submitted to advanced diabetes supply (denied due to not injecting 3x daily as req'd by his insurance plan--specific to [employer] Order submitted to total medical supply via parachute, but it it likely that this will be denied as well reports hypoglycemic symptoms Discussed meal planning options and Plate method for healthy eating Avoid sugary drinks and desserts Incorporate balanced protein, non starchy veggies, 1 serving of carbohydrate with each meal Increase water intake Increase physical activity as able Current exercise: n/a Educated on diet/lifestyle; insulin reduction; heart health diet due to hyperlipidemia Recommended libre due to hypoglycemia Assessed patient finances. Enrolled in the [employer]ordisk patient assistance program-awaiting ozempic shipment (current backorders)   Patient Goals/Self-Care Activities patient will:  - take medications as prescribed as evidenced by patient report and record review check glucose daily or if symptomatic, document, and provide at future appointments collaborate  with provider on medication access solutions  Plan: f/u 1 month  Subjective: Steven Osborne an 80y.o. year old male who is a primary patient of Rakes, LConnye Burkitt FNP.  The CCM team was consulted for assistance with disease management and care coordination needs.    Engaged with patient by telephone for initial visit in response to provider referral for pharmacy case management and/or care coordination services.   Consent to Services:  The patient was given the following information about Chronic Care Management services today, agreed to services, and gave verbal consent: 1. CCM service includes personalized support from designated clinical staff supervised by the primary care provider, including individualized plan of care and coordination with other care providers 2. 24/7 contact phone numbers for assistance for urgent and routine care needs. 3. Service will only be billed when office clinical staff spend 20 minutes or more in a month to coordinate care. 4. Only one practitioner may furnish and bill the service in a calendar month. 5.The patient may stop CCM services at any time (effective at the end of the month) by phone call to the office staff. 6. The patient will be responsible for cost sharing (co-pay) of up to 20% of the service fee (after annual deductible is met). Patient agreed to services and consent obtained.  Patient Care Team: RBaruch Gouty FNP as PCP - General (Family Medicine) [facility]Lavera Guise [facility] ([facility])  Objective:  Lab Results  Component Value Date   CREATININE 1.48 (H) 04/10/2020   CREATININE 1.49 (H) 04/09/2020   CREATININE 1.42 (H) 04/08/2020    Lab Results  Component Value Date   HGBA1C 7.8 (H) 04/08/2020   Last diabetic Eye exam:  Lab Results  Component Value Date/Time   HMDIABEYEEXA Retinopathy (A) 09/09/2014 12:00 AM    Last diabetic Foot  exam: No results found for: HMDIABFOOTEX      Component Value Date/Time   CHOL 257 (H)  04/09/2020 0210   TRIG 222 (H) 04/09/2020 0210   HDL 30 (L) 04/09/2020 0210   CHOLHDL 8.6 04/09/2020 0210   VLDL 44 (H) 04/09/2020 0210   LDLCALC 183 (H) 04/09/2020 0210   LDLDIRECT 129.5 01/01/2014 1358    Hepatic Function Latest Ref Rng & Units 04/09/2020 12/17/2017 09/24/2017  Total Protein 6.5 - 8.1 g/dL 6.3(L) 5.5(L) 6.9  Albumin 3.5 - 5.0 g/dL 3.2(L) 3.1(L) 3.7  AST 15 - 41 U/L 17 11(L) 11(L)  ALT 0 - 44 U/L 15 14 9   Alk Phosphatase 38 - 126 U/L 65 50 79  Total Bilirubin 0.3 - 1.2 mg/dL 1.1 0.8 0.9    Lab Results  Component Value Date/Time   TSH 1.166 09/24/2017 04:39 PM   FREET4 0.81 (L) 09/24/2017 12:35 PM    CBC Latest Ref Rng & Units 04/10/2020 04/09/2020 04/08/2020  WBC 4.0 - 10.5 K/uL 8.1 8.1 7.4  Hemoglobin 13.0 - 17.0 g/dL 12.6(L) 13.6 14.3  Hematocrit 39.0 - 52.0 % 37.4(L) 38.7(L) 41.9  Platelets 150 - 400 K/uL 131(L) 145(L) 159    No results found for: VD25OH  Clinical ASCVD: Yes  The ASCVD Risk score (Arnett DK, et al., 2019) failed to calculate for the following reasons:   The patient has a prior MI or stroke diagnosis    Other: (CHADS2VASc if Afib, PHQ9 if depression, MMRC or CAT for COPD, ACT, DEXA)  Social History   Tobacco Use  Smoking Status Former   Types: Cigars  Smokeless Tobacco Former   Types: Chew   Quit date: 02/21/2014   BP Readings from Last 3 Encounters:  01/21/21 105/64  12/10/20 (!) 142/70  05/12/20 (!) 148/88   Pulse Readings from Last 3 Encounters:  01/21/21 60  12/10/20 (!) 50  05/12/20 80   Wt Readings from Last 3 Encounters:  01/21/21 207 lb (93.9 kg)  12/10/20 220 lb (99.8 kg)  05/11/20 198 lb 13.7 oz (90.2 kg)    Assessment: Review of patient past medical history, allergies, medications, health status, including review of consultants reports, laboratory and other test data, was performed as part of comprehensive evaluation and provision of chronic care management services.   SDOH:  (Social Determinants of Health)  assessments and interventions performed:    CCM Care Plan  Allergies  Allergen Reactions   Crestor [Rosuvastatin Calcium]     myalgia    Medications Reviewed Today     Reviewed by Lavera Guise, [facility] (Pharmacist) on 02/09/21 at [place] List Status: <None>   Medication Order Taking? Sig Documenting Provider Last Dose Status Informant  amLODipine (NORVASC) 10 MG tablet [id number] No TAKE 1 TABLET EVERY DAY.n please keep upcoming appt in February 2023 with Richardson Dopp, PA before anymore refills. Thank you Final Attempt Josue Hector, MD Taking Active   aspirin EC 81 MG tablet [id number] No Take 81 mg by mouth daily. [provider] Taking Active Family Member  atorvastatin (LIPITOR) 20 MG tablet [id number] No Take 20 mg by mouth daily. [provider] Taking Active Family Member  carvedilol (COREG) 25 MG tablet [id number] No Take 25 mg by mouth 2 (two) times daily. [provider] Taking Active Family Member  citalopram (CELEXA) 20 MG tablet [id number] No Take 20 mg by mouth daily. [provider] Taking Active Family Member  clopidogrel (PLAVIX) 75 MG tablet [id number] No Take 1 tablet (  75 mg total) by mouth daily. Kathyrn Drown D, NP Taking Active   Continuous Blood Gluc Receiver (FREESTYLE LIBRE 2 READER) [place] [id number] No 1 Device by Does not apply route in the morning, at noon, in the evening, and at bedtime. Baruch Gouty, FNP Taking Active   Continuous Blood Gluc Sensor (FREESTYLE LIBRE 2 SENSOR) [place] [id number] No 1 Device by Does not apply route 4 (four) times daily. Baruch Gouty, FNP Taking Active   furosemide (LASIX) 40 MG tablet [id number] No Take 40 mg by mouth daily. [provider] Taking Active Family Member  hydrALAZINE (APRESOLINE) 50 MG tablet [id number] No Take 1 tablet (50 mg total) by mouth 3 (three) times daily. Josue Hector, MD Taking Active   insulin aspart protamine- aspart (NOVOLOG MIX 70/30) (70-30) 100 UNIT/ML  injection [id number] No Inject 40 Units into the skin daily. [provider] Taking Active   isosorbide mononitrate (IMDUR) 30 MG 24 hr tablet [id number] No Take 30 mg by mouth daily.  [provider] Taking Active Family Member           Med Note Kathrynn Ducking Sep 24, 2017  2:15 PM)    metFORMIN (GLUCOPHAGE) 500 MG tablet [id number] No Take 2 tablets (1,000 mg total) by mouth 2 (two) times daily with a meal. Rakes, Connye Burkitt, FNP Taking Active            Med Note Blanca Friend, Royce Macadamia   Tue Dec 15, 2020 11:35 AM) Says insurance won't pay for?  oxyCODONE (ROXICODONE) 15 MG immediate release tablet [id number] No Take 15 mg by mouth 4 (four) times daily as needed. [provider] Taking Active Family Member  pantoprazole (PROTONIX) 40 MG tablet [id number] No Take 1 tablet (40 mg total) by mouth 2 (two) times daily before a meal. Kathie Dike, MD Taking Active Family Member  Semaglutide,0.25 or 0.5MG/DOS, (OZEMPIC, 0.25 OR 0.5 MG/DOSE,) 2 MG/1.5ML SOPN [id number]  Inject 0.5 mg into the skin once a week. Baruch Gouty, FNP  Active   vitamin C (ASCORBIC ACID) 500 MG tablet [id number] No Take 500 mg by mouth daily. [provider] Taking Active   vitamin E 180 MG (400 UNITS) capsule [id number] No Take 400 Units by mouth daily. [provider] Taking Active             Patient Active Problem List   Diagnosis Date Noted   Moderate nonproliferative diabetic retinopathy of left eye with macular edema associated with type 2 diabetes mellitus ([place]) 12/10/2020   History of non-ST elevation myocardial infarction (NSTEMI) 12/10/2020   History of coronary angioplasty with insertion of stent 12/10/2020   Hx of CABG 12/10/2020   Prolonged QT interval 04/08/2020   GERD (gastroesophageal reflux disease) 04/08/2020   CKD stage 3 due to type 2 diabetes mellitus ([place]) 09/25/2017   Ischemic cardiomyopathy 24/10/7351   Chronic systolic CHF (congestive heart failure)  ([place]) 09/24/2017   Thrombocytopenia ([place]) 09/24/2017   Diabetic macular edema ([place]) 01/20/2017   Absolute glaucoma, right eye 12/11/2015   Bilateral hearing loss 06/24/2015   Arthritis 01/22/2015   Type 2 diabetes mellitus, with long-term current use of insulin ([place]) 01/07/2015   Atherosclerotic heart disease of native coronary artery without angina pectoris 02/25/2010   Hyperlipidemia associated with type 2 diabetes mellitus ([place]) 02/10/2010   Hypertension associated with diabetes ([place]) 02/10/2010   ARTERIOSCLEROSIS 02/10/2010   INTERMITTENT CLAUDICATION 02/10/2010    Immunization History  Administered Date(s) Administered  Fluad Quad(high Dose 65+) 04/10/2020, 12/10/2020   Influenza Split 01/21/2011   Influenza Whole 01/18/2010   Influenza, High Dose Seasonal PF 12/12/2016   Influenza,inj,Quad PF,6+ Mos 11/16/2012, 12/18/2017   Pneumococcal Polysaccharide-23 12/22/2008, 12/22/2008   Td 02/22/2007   Tdap 02/29/2004, 05/12/2020    Conditions to be addressed/monitored: HLD and DMII  Care Plan : PHARMD MEDICATION MANAGEMENT  Updates made by Lavera Guise, [place] since 02/16/2021 12:00 AM     Problem: DISEASE PROGRESSION PREVENTION      Long-Range Goal: T2DM   This Visit's Progress: Not on track  Priority: High  Note:   Current Barriers:  Unable to independently afford treatment regimen Unable to achieve control of T2DM  Suboptimal therapeutic regimen for T2DM  Pharmacist Clinical Goal(s):  patient will verbalize ability to afford treatment regimen achieve control of T2DM as evidenced by GOAL<7% adhere to plan to optimize therapeutic regimen for T2DM as evidenced by report of adherence to recommended medication management changes through collaboration with PharmD and provider.    Interventions: 1:1 collaboration with Baruch Gouty, FNP regarding development and update of comprehensive plan of care as evidenced by provider attestation and  co-signature Inter-disciplinary care team collaboration (see longitudinal plan of care) Comprehensive medication review performed; medication list updated in electronic medical record  Diabetes: New goal. Uncontrolled-A1C improved to 7.8%, GFR 48; current treatment:tresiba, new start ozempic;  Decrease insulin to tresiba 36 units daily due to hypoglycemia Continue ozempic-->dose to 0.75m sq weekly Denies personal and family history of Medullary thyroid cancer (MTC) Application submitted to novo n[employer] patient assistance program --still awaiting shipment due to current back orders; order will ship to PCP office in 30-60 days; will continue to provide samples as needed and as able Current glucose readings: fasting glucose: <150; more recently having hypoglycemia, post prandial glucose: <200 Libre 2 CGM order submitted to advanced diabetes supply (denied due to not injecting 3x daily as req'd by his insurance plan--[place] to [place] Order submitted to total medical supply via parachute, but it it likely that this will be denied as well reports hypoglycemic symptoms Discussed meal planning options and Plate method for healthy eating Avoid sugary drinks and desserts Incorporate balanced protein, non starchy veggies, 1 serving of carbohydrate with each meal Increase water intake Increase physical activity as able Current exercise: n/a Educated on diet/lifestyle; insulin reduction; heart health diet due to hyperlipidemia Recommended libre due to hypoglycemia Assessed patient finances. Enrolled in the novo nordisk patient assistance program-awaiting ozempic shipment (current backorders)   Patient Goals/Self-Care Activities patient will:  - take medications as prescribed as evidenced by patient report and record review check glucose daily or if symptomatic, document, and provide at future appointments collaborate with provider on medication access solutions      Medication Assistance:   ozempicobtained through novo nordisk medication assistance program.  Enrollment ends 02/20/22--current backorders  [facility]:  [facility] [address]Phone: [phone]: [facility] [place] [address]Phone: [phone]: [phone] Follow Up:  Patient agrees to Care Plan and Follow-up.  Plan: Telephone follow up appointment with care management team member scheduled for:  1 month   JRegina Eck PharmD, BCPS Clinical Pharmacist, [place] Phone [phone]

## 2021-02-09 ENCOUNTER — Telehealth: Payer: Self-pay

## 2021-02-09 NOTE — Telephone Encounter (Signed)
Pt stopped by requesting ozempic samples and we are completely out and unsure when we will receive. Spoke to [employer]

## 2021-02-09 NOTE — Patient Instructions (Addendum)
Visit Information  Thank you for taking time to visit with me today. Please don't hesitate to contact me if I can be of assistance to you before our next scheduled telephone appointment.  Following are the goals we discussed today:  Current Barriers:  Unable to independently afford treatment regimen Unable to achieve control of T2DM  Suboptimal therapeutic regimen for T2DM  Pharmacist Clinical Goal(s):  patient will verbalize ability to afford treatment regimen achieve control of T2DM as evidenced by GOAL<7% adhere to plan to optimize therapeutic regimen for T2DM as evidenced by report of adherence to recommended medication management changes through collaboration with PharmD and provider.    Interventions: 1:1 collaboration with Sonny Masters, FNP regarding development and update of comprehensive plan of care as evidenced by provider attestation and co-signature Inter-disciplinary care team collaboration (see longitudinal plan of care) Comprehensive medication review performed; medication list updated in electronic medical record  Diabetes: New goal. Uncontrolled-A1C improved to 7.8%, GFR 48; current treatment:tresiba, new start ozempic;  Decrease insulin to tresiba 36 units daily due to hypoglycemia Continue ozempic-->dose to 0.5mg  sq weekly Denies personal and family history of Medullary thyroid cancer (MTC) Application submitted to novo nordisk patient assistance program --still awaiting shipment due to current back orders; order will ship to PCP office in 30-60 days; will continue to provide samples as needed and as able Current glucose readings: fasting glucose: <150; more recently having hypoglycemia, post prandial glucose: <200 Libre 2 CGM order submitted to advanced diabetes supply (denied due to not injecting 3x daily as req'd by his insurance plan--specific to [place]) Order submitted to total medical supply via parachute, but it it likely that this will be denied as  well reports hypoglycemic symptoms Discussed meal planning options and Plate method for healthy eating Avoid sugary drinks and desserts Incorporate balanced protein, non starchy veggies, 1 serving of carbohydrate with each meal Increase water intake Increase physical activity as able Current exercise: n/a Educated on diet/lifestyle; insulin reduction; heart health diet due to hyperlipidemia Recommended libre due to hypoglycemia Assessed patient finances. Enrolled in the novo nordisk patient assistance program-awaiting ozempic shipment (current backorders)   Patient Goals/Self-Care Activities patient will:  - take medications as prescribed as evidenced by patient report and record review check glucose daily or if symptomatic, document, and provide at future appointments collaborate with provider on medication access solutions   Please call the care guide team at [phone] if you need to cancel or reschedule your appointment.    The patient verbalized understanding of instructions, educational materials, and care plan provided today and declined offer to receive copy of patient instructions, educational materials, and care plan.   Signature Kieth Brightly, PharmD, BCPS Clinical Pharmacist, [facility]  [facility] Phone [phone]

## 2021-02-10 NOTE — Telephone Encounter (Signed)
Can we check on patient's ozempic shipment? His insulin came in I'm assuming back order

## 2021-02-10 NOTE — Telephone Encounter (Signed)
[employer] is not wanting to pay for libre CGM device They require patient to be on 3x daily insulin for patient to qualify

## 2021-02-10 NOTE — Telephone Encounter (Signed)
Left message requesting call back in regards to ozempic shipment.  Call back [phone]

## 2021-02-10 NOTE — Telephone Encounter (Signed)
PT RETURNED CALL.  Explained to pt that this medication is having supply issues. His shipment has been processing since 01/07/21 but the only info [employer] can give [place] is that shipments are taking 30+ days for this medication. Recommended pt speak to his pcp about the issue as they may have an alternative plan for him. Pt expressed understanding and said "just get it as soon as possible".

## 2021-02-11 ENCOUNTER — Telehealth: Payer: Self-pay | Admitting: Family Medicine

## 2021-02-19 ENCOUNTER — Telehealth: Payer: Self-pay | Admitting: Family Medicine

## 2021-02-19 NOTE — Telephone Encounter (Signed)
Called patient to schedule AWV  ?

## 2021-02-20 DIAGNOSIS — Z794 Long term (current) use of insulin: Secondary | ICD-10-CM

## 2021-02-20 DIAGNOSIS — E785 Hyperlipidemia, unspecified: Secondary | ICD-10-CM

## 2021-02-20 DIAGNOSIS — E1169 Type 2 diabetes mellitus with other specified complication: Secondary | ICD-10-CM

## 2021-02-24 ENCOUNTER — Telehealth: Payer: Medicare HMO

## 2021-02-25 ENCOUNTER — Ambulatory Visit (INDEPENDENT_AMBULATORY_CARE_PROVIDER_SITE_OTHER): Payer: Medicare HMO

## 2021-02-25 VITALS — Ht 70.0 in | Wt 210.0 lb

## 2021-02-25 DIAGNOSIS — Z0000 Encounter for general adult medical examination without abnormal findings: Secondary | ICD-10-CM

## 2021-02-25 NOTE — Patient Instructions (Signed)
Steven Osborne , Thank you for taking time to come for your Medicare Wellness Visit. I appreciate your ongoing commitment to your health goals. Please review the following plan we discussed and let me know if I can assist you in the future.   Screening recommendations/referrals: Colonoscopy: No longer required Recommended yearly ophthalmology/optometry visit for glaucoma screening and checkup Recommended yearly dental visit for hygiene and checkup  Vaccinations: Influenza vaccine: Done 12/10/2020 - Repeat annually  Pneumococcal vaccine: Done 12/22/2008  Tdap vaccine: Done 05/12/2020 - Repeat in 10 years Shingles vaccine: Due   Covid-19: Done - 2 doses early 2021 - we need dates please  Advanced directives: Please bring a copy of your health care power of attorney and living will to the office to be added to your chart at your convenience.   Conditions/risks identified: Aim for 30 minutes of exercise or brisk walking each day, drink 6-8 glasses of water and eat lots of fruits and vegetables.   Next appointment: Follow up in one year for your annual wellness visit.   Preventive Care 42 Years and Older, Male  Preventive care refers to lifestyle choices and visits with your health care provider that can promote health and wellness. What does preventive care include? A yearly physical exam. This is also called an annual well check. Dental exams once or twice a year. Routine eye exams. Ask your health care provider how often you should have your eyes checked. Personal lifestyle choices, including: Daily care of your teeth and gums. Regular physical activity. Eating a healthy diet. Avoiding tobacco and drug use. Limiting alcohol use. Practicing safe sex. Taking low doses of aspirin every day. Taking vitamin and mineral supplements as recommended by your health care provider. What happens during an annual well check? The services and screenings done by your health care provider during your  annual well check will depend on your age, overall health, lifestyle risk factors, and family history of disease. Counseling  Your health care provider may ask you questions about your: Alcohol use. Tobacco use. Drug use. Emotional well-being. Home and relationship well-being. Sexual activity. Eating habits. History of falls. Memory and ability to understand (cognition). Work and work Statistician. Screening  You may have the following tests or measurements: Height, weight, and BMI. Blood pressure. Lipid and cholesterol levels. These may be checked every 5 years, or more frequently if you are over 61 years old. Skin check. Lung cancer screening. You may have this screening every year starting at age 50 if you have a 30-pack-year history of smoking and currently smoke or have quit within the past 15 years. Fecal occult blood test (FOBT) of the stool. You may have this test every year starting at age 18. Flexible sigmoidoscopy or colonoscopy. You may have a sigmoidoscopy every 5 years or a colonoscopy every 10 years starting at age 25. Prostate cancer screening. Recommendations will vary depending on your family history and other risks. Hepatitis C blood test. Hepatitis B blood test. Sexually transmitted disease (STD) testing. Diabetes screening. This is done by checking your blood sugar (glucose) after you have not eaten for a while (fasting). You may have this done every 1-3 years. Abdominal aortic aneurysm (AAA) screening. You may need this if you are a current or former smoker. Osteoporosis. You may be screened starting at age 17 if you are at high risk. Talk with your health care provider about your test results, treatment options, and if necessary, the need for more tests. Vaccines  Your health care  provider may recommend certain vaccines, such as: Influenza vaccine. This is recommended every year. Tetanus, diphtheria, and acellular pertussis (Tdap, Td) vaccine. You may need a Td  booster every 10 years. Zoster vaccine. You may need this after age 62. Pneumococcal 13-valent conjugate (PCV13) vaccine. One dose is recommended after age 29. Pneumococcal polysaccharide (PPSV23) vaccine. One dose is recommended after age 40. Talk to your health care provider about which screenings and vaccines you need and how often you need them. This information is not intended to replace advice given to you by your health care provider. Make sure you discuss any questions you have with your health care provider. Document Released: 03/06/2015 Document Revised: 10/28/2015 Document Reviewed: 12/09/2014 Elsevier Interactive Patient Education  2017 [place] Prevention in the Home Falls can cause injuries. They can happen to people of all ages. There are many things you can do to make your home safe and to help prevent falls. What can I do on the outside of my home? Regularly fix the edges of walkways and driveways and fix any cracks. Remove anything that might make you trip as you walk through a door, such as a raised step or threshold. Trim any bushes or trees on the path to your home. Use bright outdoor lighting. Clear any walking paths of anything that might make someone trip, such as rocks or tools. Regularly check to see if handrails are loose or broken. Make sure that both sides of any steps have handrails. Any raised decks and porches should have guardrails on the edges. Have any leaves, snow, or ice cleared regularly. Use sand or salt on walking paths during winter. Clean up any spills in your garage right away. This includes oil or grease spills. What can I do in the bathroom? Use night lights. Install grab bars by the toilet and in the tub and shower. Do not use towel bars as grab bars. Use non-skid mats or decals in the tub or shower. If you need to sit down in the shower, use a plastic, non-slip stool. Keep the floor dry. Clean up any water that spills on the floor  as soon as it happens. Remove soap buildup in the tub or shower regularly. Attach bath mats securely with double-sided non-slip rug tape. Do not have throw rugs and other things on the floor that can make you trip. What can I do in the bedroom? Use night lights. Make sure that you have a light by your bed that is easy to reach. Do not use any sheets or blankets that are too big for your bed. They should not hang down onto the floor. Have a firm chair that has side arms. You can use this for support while you get dressed. Do not have throw rugs and other things on the floor that can make you trip. What can I do in the kitchen? Clean up any spills right away. Avoid walking on wet floors. Keep items that you use a lot in easy-to-reach places. If you need to reach something above you, use a strong step stool that has a grab bar. Keep electrical cords out of the way. Do not use floor polish or wax that makes floors slippery. If you must use wax, use non-skid floor wax. Do not have throw rugs and other things on the floor that can make you trip. What can I do with my stairs? Do not leave any items on the stairs. Make sure that there are handrails on both  sides of the stairs and use them. Fix handrails that are broken or loose. Make sure that handrails are as long as the stairways. Check any carpeting to make sure that it is firmly attached to the stairs. Fix any carpet that is loose or worn. Avoid having throw rugs at the top or bottom of the stairs. If you do have throw rugs, attach them to the floor with carpet tape. Make sure that you have a light switch at the top of the stairs and the bottom of the stairs. If you do not have them, ask someone to add them for you. What else can I do to help prevent falls? Wear shoes that: Do not have high heels. Have rubber bottoms. Are comfortable and fit you well. Are closed at the toe. Do not wear sandals. If you use a stepladder: Make sure that it is  fully opened. Do not climb a closed stepladder. Make sure that both sides of the stepladder are locked into place. Ask someone to hold it for you, if possible. Clearly mark and make sure that you can see: Any grab bars or handrails. First and last steps. Where the edge of each step is. Use tools that help you move around (mobility aids) if they are needed. These include: Canes. Walkers. Scooters. Crutches. Turn on the lights when you go into a dark area. Replace any light bulbs as soon as they burn out. Set up your furniture so you have a clear path. Avoid moving your furniture around. If any of your floors are uneven, fix them. If there are any pets around you, be aware of where they are. Review your medicines with your doctor. Some medicines can make you feel dizzy. This can increase your chance of falling. Ask your doctor what other things that you can do to help prevent falls. This information is not intended to replace advice given to you by your health care provider. Make sure you discuss any questions you have with your health care provider. Document Released: 12/04/2008 Document Revised: 07/16/2015 Document Reviewed: 03/14/2014 Elsevier Interactive Patient Education  2017 [employer].

## 2021-02-25 NOTE — Progress Notes (Signed)
Subjective:   Steven Osborne is a 81 y.o. male who presents for an Initial Medicare Annual Wellness Visit.  Virtual Visit via Telephone Note  I connected with  Steven Osborne on 02/25/21 at  2:00 PM EST by telephone and verified that I am speaking with the correct person using two identifiers.  Location: Patient: Home Provider: [facility] Persons participating in the virtual visit: patient/Nurse Health Advisor   I discussed the limitations, risks, security and privacy concerns of performing an evaluation and management service by telephone and the availability of in person appointments. The patient expressed understanding and agreed to proceed.  Interactive audio and video telecommunications were attempted between this nurse and patient, however failed, due to patient having technical difficulties OR patient did not have access to video capability.  We continued and completed visit with audio only.  Some vital signs may be absent or patient reported.   Alvaretta Eisenberger E Edmund Holcomb, LPN   Review of Systems     Cardiac Risk Factors include: advanced age (>3755men, 63>65 women);obesity (BMI >30kg/m2);diabetes mellitus;dyslipidemia;hypertension;male gender;Other (see comment), Risk factor comments: hx of MI, atherosclerosis, chronic CHF     Objective:    Today's Vitals   02/25/21 1357  Weight: 210 lb (95.3 kg)  Height: 5\' 10"  (1.778 m)   Body mass index is 30.13 kg/m.  Advanced Directives 02/25/2021 04/08/2020 04/08/2020 03/02/2018 12/19/2017 12/18/2017 12/17/2017  Does Patient Have a Medical Advance Directive? Yes Yes No Yes Yes Yes No  Type of Estate agentAdvance Directive Healthcare Power of [place]orney;Living will Healthcare Power of [place]orney;Living will - Living will;Healthcare Power of State Street Corporationttorney Healthcare Power of [place]orney;Living will Healthcare Power of [place]orney;Living will -  Does patient want to make changes to medical advance directive? - No - Patient declined No - Patient declined No - Patient declined No - Patient  declined - -  Copy of Healthcare Power of Attorney in Chart? No - copy requested No - copy requested - No - copy requested No - copy requested - -  Would patient like information on creating a medical advance directive? - No - Patient declined - No - Patient declined - No - Patient declined No - Patient declined    Current Medications (verified) Outpatient Encounter Medications as of 02/25/2021  Medication Sig   amLODipine (NORVASC) 10 MG tablet TAKE 1 TABLET EVERY DAY.n please keep upcoming appt in February 2023 with Tereso NewcomerScott Weaver, PA before anymore refills. Thank you Final Attempt   aspirin EC 81 MG tablet Take 81 mg by mouth daily.   atorvastatin (LIPITOR) 20 MG tablet Take 20 mg by mouth daily.   carvedilol (COREG) 25 MG tablet Take 25 mg by mouth 2 (two) times daily.   citalopram (CELEXA) 20 MG tablet Take 20 mg by mouth daily.   clopidogrel (PLAVIX) 75 MG tablet Take 1 tablet (75 mg total) by mouth daily.   Continuous Blood Gluc Sensor (FREESTYLE LIBRE 2 SENSOR) MISC 1 Device by Does not apply route 4 (four) times daily.   dorzolamide-timolol (COSOPT) 22.3-6.8 MG/ML ophthalmic solution SMARTSIG:In Eye(s)   furosemide (LASIX) 40 MG tablet Take 40 mg by mouth daily.   hydrALAZINE (APRESOLINE) 25 MG tablet Take 25 mg by mouth 3 (three) times daily.   insulin aspart protamine- aspart (NOVOLOG MIX 70/30) (70-30) 100 UNIT/ML injection Inject 40 Units into the skin daily.   isosorbide mononitrate (IMDUR) 30 MG 24 hr tablet Take 30 mg by mouth daily.    metFORMIN (GLUCOPHAGE) 500 MG tablet Take 2 tablets (1,000 mg total)  by mouth 2 (two) times daily with a meal.   oxyCODONE (ROXICODONE) 15 MG immediate release tablet Take 15 mg by mouth 4 (four) times daily as needed.   pantoprazole (PROTONIX) 40 MG tablet Take 1 tablet (40 mg total) by mouth 2 (two) times daily before a meal.   Semaglutide,0.25 or 0.5MG /DOS, (OZEMPIC, 0.25 OR 0.5 MG/DOSE,) 2 MG/1.5ML SOPN Inject 0.5 mg into the skin once a week.    TRUE METRIX BLOOD GLUCOSE TEST test strip SMARTSIG:Via Meter   TRUEplus Lancets 33G MISC    vitamin C (ASCORBIC ACID) 500 MG tablet Take 500 mg by mouth daily.   vitamin E 180 MG (400 UNITS) capsule Take 400 Units by mouth daily.   Continuous Blood Gluc Receiver (FREESTYLE LIBRE 2 READER) DEVI 1 Device by Does not apply route in the morning, at noon, in the evening, and at bedtime. (Patient not taking: Reported on 02/25/2021)   [DISCONTINUED] hydrALAZINE (APRESOLINE) 50 MG tablet Take 1 tablet (50 mg total) by mouth 3 (three) times daily.   No facility-administered encounter medications on file as of 02/25/2021.    Allergies (verified) Crestor [rosuvastatin calcium]   History: Past Medical History:  Diagnosis Date   ARTERIOSCLEROSIS 02/10/2010   CAD 02/25/2010   DIABETES TYPE 2 02/10/2010   Glaucoma    right eye   HYPERLIPIDEMIA NOS 02/10/2010   HYPERTENSION 02/10/2010   INTERMITTENT CLAUDICATION 02/10/2010   MI, old    Past Surgical History:  Procedure Laterality Date   BIOPSY  12/18/2017   Procedure: BIOPSY;  Surgeon: West BaliFields, Sandi L, MD;  [facility];  Service: Endoscopy;;   CORONARY ARTERY BYPASS GRAFT  2005   4 vessel bypass    CORONARY STENT INTERVENTION N/A 04/09/2020   Procedure: CORONARY STENT INTERVENTION;  Surgeon: Yvonne KendallEnd, Christopher, MD;  Location: [facility];  Service: Cardiovascular;  Laterality: N/A;  OM-2   ESOPHAGOGASTRODUODENOSCOPY N/A 12/18/2017   Procedure: ESOPHAGOGASTRODUODENOSCOPY (EGD);  Surgeon: West BaliFields, Sandi L, MD;  [facility]: [facility];  Service: Endoscopy;  Laterality: N/A;   INTRAVASCULAR IMAGING/OCT N/A 04/09/2020   Procedure: INTRAVASCULAR IMAGING/OCT;  Surgeon: Yvonne KendallEnd, Christopher, MD;  [facility]: [facility];  Service: Cardiovascular;  Laterality: N/A;   LEFT HEART CATH AND CORS/GRAFTS ANGIOGRAPHY N/A 04/09/2020   Procedure: LEFT HEART CATH AND CORS/GRAFTS ANGIOGRAPHY;  Surgeon: Yvonne KendallEnd, Christopher, MD;  [facility]: [facility];  Service: Cardiovascular;  Laterality: N/A;   Family History  Problem Relation Age of Onset   Diabetes Mother    Heart disease Mother    Diabetes Father    Heart disease Father    Stroke Father    Diabetes Sister    Diabetes Brother    Hypertension Other    Colon cancer Neg Hx    Colon polyps Neg Hx    Social History   Socioeconomic History   Marital status: Divorced    Spouse name: Not on file   Number of children: 1   Years of education: Not on file   Highest education level: Not on file  Occupational History   Not on file  Tobacco Use   Smoking status: Former    Types: Cigars   Smokeless tobacco: Former    Types: Chew    Quit date: 02/21/2014  Vaping Use   Vaping Use: Never used  Substance and Sexual Activity   Alcohol use: No    Comment: as a teenager to late 20s, none since    Drug use: No  Sexual activity: Not on file  Other Topics Concern   Not on file  Social History Narrative   Divorced, lives aloneRegular exercise-yes   Daughter, Babette Relic - lives in [place], Inetta Fermo helps him a lot - helps him clean and cook, handles medications, shopping, drives him to out of town appointments.   Social Determinants of Health   Financial Resource Strain: Low Risk    Difficulty of Paying Living Expenses: Not hard at all  Food Insecurity: No Food Insecurity   Worried About Programme researcher, broadcasting/film/video in the Last Year: Never true   Ran Out of Food in the Last Year: Never true  Transportation Needs: No Transportation Needs   Lack of Transportation (Medical): No   Lack of Transportation (Non-Medical): No  Physical Activity: Sufficiently Active   Days of Exercise per Week: 7 days   Minutes of Exercise per Session: 30 min  Stress: No Stress Concern Present   Feeling of Stress : Not at all  Social Connections: Moderately Integrated   Frequency of Communication with Friends and Family: More than three times a week   Frequency of Social Gatherings with Friends and  Family: More than three times a week   Attends Religious Services: More than 4 times per year   Active Member of [employer] or Organizations: Yes   Attends Banker Meetings: More than 4 times per year   Marital Status: Widowed    Tobacco Counseling Counseling given: Not Answered   Clinical Intake:  Pre-visit preparation completed: Yes  Pain : No/denies pain     BMI - recorded: 30.13 Nutritional Status: BMI > 30  Obese Nutritional Risks: None Diabetes: Yes CBG done?: No Did pt. bring in CBG monitor from home?: No  How often do you need to have someone help you when you read instructions, pamphlets, or other written materials from your doctor or pharmacy?: 1 - Never  Diabetic? Nutrition Risk Assessment:  Has the patient had any N/V/D within the last 2 months?  No  Does the patient have any non-healing wounds?  No  Has the patient had any unintentional weight loss or weight gain?  No   Diabetes:  Is the patient diabetic?  Yes  If diabetic, was a CBG obtained today?  No  Did the patient bring in their glucometer from home?  No  How often do you monitor your CBG's? 3-4 times per day.   Financial Strains and Diabetes Management:  Are you having any financial strains with the device, your supplies or your medication? No .  Does the patient want to be seen by Chronic Care Management for management of their diabetes?  No  Would the patient like to be referred to a Nutritionist or for Diabetic Management?  No   Diabetic Exams:  Diabetic Eye Exam: Completed 09/2020.  Diabetic Foot Exam: Completed 12/10/2020. Pt has been advised about the importance in completing this exam. Pt is scheduled for diabetic foot exam on next year.    Interpreter Needed?: No  Information entered by :: Hiilani Jetter, LPN   Activities of Daily Living In your present state of health, do you have any difficulty performing the following activities: 02/25/2021 04/08/2020  Hearing? Y -  Comment  wears hearing aids -  Vision? Y -  Comment glaucoma, retinopathy, macular edema -  Difficulty concentrating or making decisions? N -  Walking or climbing stairs? N -  Dressing or bathing? N -  Doing errands, shopping? Malvin Johns  Comment He only drives locally Chief Operating Officer and eating ? Y -  Comment Neighbor, Inetta Fermo brings him dinner, takes him shopping, and helps cook and clean -  Using the Toilet? N -  In the past six months, have you accidently leaked urine? N -  Do you have problems with loss of bowel control? N -  Managing your Medications? Y -  Comment Neighbor, Inetta Fermo helps -  Managing your Finances? N -  Housekeeping or managing your Housekeeping? Y -  Comment Neighbor, Inetta Fermo helps -  Some recent data might be hidden    Patient Care Team: Rakes, Doralee Albino, FNP as PCP - General (Family Medicine) Danella Maiers, [facility] as Pharmacist (Family Medicine)  Indicate any recent Medical Services you may have received from other than [facility] providers in the past year (date may be approximate).     Assessment:   This is a routine wellness examination for Steven Osborne.  Hearing/Vision screen Hearing Screening - Comments:: Wears hearing aids - has routine visits with audiology in [place]  Vision Screening - Comments:: Wears rx glasses - up to date with annual eye exams with Despina Arias in [place]; also gets [facility] in eyes at [facility] every 6 weeks  Dietary issues and exercise activities discussed: Current Exercise Habits: Home exercise routine, Type of exercise: walking, Time (Minutes): 30, Frequency (Times/Week): 7, Weekly Exercise (Minutes/Week): 210, Intensity: Mild, Exercise limited by: orthopedic condition(s)   Goals Addressed               This Visit's Progress     T2DM (pt-stated)   On track     Current Barriers:  Unable to independently afford treatment regimen Unable to achieve control of T2DM  Suboptimal therapeutic regimen for T2DM  Pharmacist Clinical Goal(s):  patient will  verbalize ability to afford treatment regimen achieve control of T2DM as evidenced by GOAL<7% adhere to plan to optimize therapeutic regimen for T2DM as evidenced by report of adherence to recommended medication management changes through collaboration with PharmD and provider.    Interventions: 1:1 collaboration with Sonny Masters, FNP regarding development and update of comprehensive plan of care as evidenced by provider attestation and co-signature Inter-disciplinary care team collaboration (see longitudinal plan of care) Comprehensive medication review performed; medication list updated in electronic medical record  Diabetes: New goal. Uncontrolled-A1C improved to 7.8%, GFR 48; current treatment:tresiba, new start ozempic;  Decrease insulin to tresiba 36 units daily due to hypoglycemia Continue ozempic-->dose to 0.5mg  sq weekly Denies personal and family history of Medullary thyroid cancer (MTC) Application submitted to novo nordisk patient assistance program --still awaiting shipment due to current back orders; order will ship to PCP office in 30-60 days; will continue to provide samples as needed and as able Current glucose readings: fasting glucose: <150; more recently having hypoglycemia, post prandial glucose: <200 Libre 2 CGM order submitted to advanced diabetes supply (denied due to not injecting 3x daily as req'd by his insurance plan--specific to [place]) Order submitted to total medical supply via parachute, but it it likely that this will be denied as well reports hypoglycemic symptoms Discussed meal planning options and Plate method for healthy eating Avoid sugary drinks and desserts Incorporate balanced protein, non starchy veggies, 1 serving of carbohydrate with each meal Increase water intake Increase physical activity as able Current exercise: n/a Educated on diet/lifestyle; insulin reduction; heart health diet due to hyperlipidemia Recommended libre due to  hypoglycemia Assessed patient finances. Enrolled in the novo nordisk patient assistance program-awaiting  ozempic shipment (current backorders)   Patient Goals/Self-Care Activities patient will:  - take medications as prescribed as evidenced by patient report and record review check glucose daily or if symptomatic, document, and provide at future appointments collaborate with provider on medication access solutions        Depression Screen PHQ 2/9 Scores 02/25/2021 01/21/2021 12/10/2020  PHQ - 2 Score 0 0 0  PHQ- 9 Score 0 0 0    Fall Risk Fall Risk  02/25/2021 01/21/2021 12/10/2020  Falls in the past year? 0 0 0  Number falls in past yr: 0 - -  Injury with Fall? 0 - -  Risk for fall due to : Impaired vision;Medication side effect;Mental status change - -  Follow up Falls prevention discussed - -    FALL RISK PREVENTION PERTAINING TO THE HOME:  Any stairs in or around the home? Yes  If so, are there any without handrails? No  Home free of loose throw rugs in walkways, pet beds, electrical cords, etc? Yes  Adequate lighting in your home to reduce risk of falls? Yes   ASSISTIVE DEVICES UTILIZED TO PREVENT FALLS:  Life alert? No  Use of a cane, walker or w/c? No  Grab bars in the bathroom? Yes  Shower chair or bench in shower? Yes  Elevated toilet seat or a handicapped toilet? Yes   TIMED UP AND GO:  Was the test performed? No . Telephonic visit  Cognitive Function:     6CIT Screen 02/25/2021  What Year? 0 points  What month? 0 points  What time? 0 points  Count back from 20 0 points  Months in reverse 0 points  Repeat phrase 6 points  Total Score 6    Immunizations Immunization History  Administered Date(s) Administered   Fluad Quad(high Dose 65+) 04/10/2020, 12/10/2020   Influenza Split 01/21/2011   Influenza Whole 01/18/2010   Influenza, High Dose Seasonal PF 12/12/2016   Influenza,inj,Quad PF,6+ Mos 11/16/2012, 12/18/2017   Pneumococcal Polysaccharide-23  12/22/2008, 12/22/2008   Td 02/22/2007   Tdap 02/29/2004, 05/12/2020    TDAP status: Up to date  Flu Vaccine status: Up to date  Pneumococcal vaccine status: Up to date  Covid-19 vaccine status: Completed vaccines  Qualifies for Shingles Vaccine? Yes   Zostavax completed Yes   Shingrix Completed?: No.    Education has been provided regarding the importance of this vaccine. Patient has been advised to call insurance company to determine out of pocket expense if they have not yet received this vaccine. Advised may also receive vaccine at local pharmacy or Health Dept. Verbalized acceptance and understanding.  Screening Tests Health Maintenance  Topic Date Due   COVID-19 Vaccine (1) Never done   Pneumonia Vaccine 63+ Years old (2 - PCV) 12/22/2009   OPHTHALMOLOGY EXAM  09/09/2015   HEMOGLOBIN A1C  10/06/2020   Zoster Vaccines- Shingrix (1 of 2) 03/12/2021 (Originally 05/06/1990)   Hepatitis C Screening  12/10/2021 (Originally 05/06/1958)   FOOT EXAM  12/10/2021   TETANUS/TDAP  05/13/2030   INFLUENZA VACCINE  Completed   HPV VACCINES  Aged Out    Health Maintenance  Health Maintenance Due  Topic Date Due   COVID-19 Vaccine (1) Never done   Pneumonia Vaccine 68+ Years old (2 - PCV) 12/22/2009   OPHTHALMOLOGY EXAM  09/09/2015   HEMOGLOBIN A1C  10/06/2020    Colorectal cancer screening: No longer required.   Lung Cancer Screening: (Low Dose CT Chest recommended if Age 7-80 years, 30 pack-year currently  smoking OR have quit w/in 15years.) does not qualify.   Additional Screening:  Hepatitis C Screening: does not qualify  Vision Screening: Recommended annual ophthalmology exams for early detection of glaucoma and other disorders of the eye. Is the patient up to date with their annual eye exam?  Yes  Who is the provider or what is the name of the office in which the patient attends annual eye exams? Despina Arias and [facility] If pt is not established with a provider, would they like  to be referred to a provider to establish care? No .   Dental Screening: Recommended annual dental exams for proper oral hygiene  Community Resource Referral / Chronic Care Management: CRR required this visit?  No   CCM required this visit?  No      Plan:     I have personally reviewed and noted the following in the patients chart:   Medical and social history Use of alcohol, tobacco or illicit drugs  Current medications and supplements including opioid prescriptions. Patient is currently taking opioid prescriptions. Information provided to patient regarding non-opioid alternatives. Patient advised to discuss non-opioid treatment plan with their provider. Functional ability and status Nutritional status Physical activity Advanced directives List of other physicians Hospitalizations, surgeries, and ER visits in previous 12 months Vitals Screenings to include cognitive, depression, and falls Referrals and appointments  In addition, I have reviewed and discussed with patient certain preventive protocols, quality metrics, and best practice recommendations. A written personalized care plan for preventive services as well as general preventive health recommendations were provided to patient.   Due to this being a telephonic visit, the after visit summary with patients personalized plan was offered to patient via mail or my-chart. Patient declined at this time  [place] Constable, LPN   04/29/2503   Nurse Notes: None

## 2021-02-26 ENCOUNTER — Ambulatory Visit (INDEPENDENT_AMBULATORY_CARE_PROVIDER_SITE_OTHER): Payer: Medicare HMO | Admitting: Pharmacist

## 2021-02-26 VITALS — BP 110/70

## 2021-02-26 DIAGNOSIS — E1122 Type 2 diabetes mellitus with diabetic chronic kidney disease: Secondary | ICD-10-CM

## 2021-02-26 DIAGNOSIS — N183 Chronic kidney disease, stage 3 unspecified: Secondary | ICD-10-CM

## 2021-02-26 DIAGNOSIS — E1169 Type 2 diabetes mellitus with other specified complication: Secondary | ICD-10-CM

## 2021-02-28 ENCOUNTER — Encounter: Payer: Self-pay | Admitting: Pharmacist

## 2021-02-28 NOTE — Patient Instructions (Addendum)
Visit Information  Thank you for taking time to visit with me today. Please don't hesitate to contact me if I can be of assistance to you before our next scheduled telephone appointment.  Following are the goals we discussed today:  Current Barriers:  Unable to independently afford treatment regimen Unable to achieve control of T2DM  Suboptimal therapeutic regimen for T2DM  Pharmacist Clinical Goal(s):  patient will verbalize ability to afford treatment regimen achieve control of T2DM as evidenced by GOAL<7% adhere to plan to optimize therapeutic regimen for T2DM as evidenced by report of adherence to recommended medication management changes through collaboration with PharmD and provider.    Interventions: 1:1 collaboration with Rakes, Doralee Albino, FNP regarding development and update of comprehensive plan of care as evidenced by provider attestation and co-signature Inter-disciplinary care team collaboration (see longitudinal plan of care) Comprehensive medication review performed; medication list updated in electronic medical record  Diabetes: New goal. Uncontrolled-A1C improved to 7.8%, GFR 48; current treatment:tresiba, new start ozempic;  Out of tresiba-->taking 70/30 again--decreased to 25 units (from 40 units BID) Will attempt to get tresiba for patient (improved glycemic control) Continue ozempic-->dose to 0.5mg  sq weekly Denies personal and family history of Medullary thyroid cancer (MTC) Patient enrolled in novo nordisk patient assistance program until 01/2022--shipment arrived today Tolerating well Will decrease metformin at f/u due to GFR 48 Consider SGLT2/Farxiga GFR 48 Current glucose readings: fasting glucose: <150; DENIES hypoglycemia, post prandial glucose: <200 Libre 2 CGM order submitted to advanced diabetes supply (denied due to not injecting 3x daily as req'd by his insurance plan--specific to [place]) Order submitted to total medical supply via parachute, but it it  likely that this will be denied as well No recent reports of hypoglycemic symptoms (initially-->now improved, now episodes, insulin reduced) Discussed meal planning options and Plate method for healthy eating Avoid sugary drinks and desserts Incorporate balanced protein, non starchy veggies, 1 serving of carbohydrate with each meal Increase water intake Increase physical activity as able Current exercise: n/a Educated on diet/lifestyle; insulin reduction; heart healthy diet due to hyperlipidemia Recommended libre due to hypoglycemia Assessed patient finances. Enrolled in the novo nordisk patient assistance program-patient picked up shipment #2 boxes today   Patient Goals/Self-Care Activities patient will:  - take medications as prescribed as evidenced by patient report and record review check glucose daily or if symptomatic, document, and provide at future appointments collaborate with provider on medication access solutions   Our next appointment is by telephone on 03/11/21 at 2pm  Please call the care guide team at [phone] if you need to cancel or reschedule your appointment.   If you are experiencing a Mental Health or Behavioral Health Crisis or need someone to talk to, please call the Suicide and Crisis Lifeline: 988 call the [place] National Suicide Prevention Lifeline: 778-359-4975 or TTY: (838) 184-9342 TTY 5173590108) to talk to a trained counselor   The patient verbalized understanding of instructions, educational materials, and care plan provided today and declined offer to receive copy of patient instructions, educational materials, and care plan.   Signature Kieth Brightly, PharmD, BCPS Clinical Pharmacist, [facility]  II Phone [phone]

## 2021-02-28 NOTE — Progress Notes (Signed)
Chronic Care Management Pharmacy Note  02/26/2021 Name:  Steven Osborne MRN:  [id number] DOB:  1940-05-18  Summary: T2DM, HLD, HTN  Recommendations/Changes made from today's visit:  Pharmacist Clinical Goal(s):   Diabetes: Goal on Track (progressing): YES. Uncontrolled-A1C improved to 7.8%, GFR 48; current treatment:tresiba, new start ozempic;  Out of tresiba-->taking 70/30 again--decreased to 25 units (from 40 units BID) Will attempt to get tresiba for patient VIA PATIENT ASSISTANCE PROGRAM (improved glycemic control;DECREASED HYPOGLYCEMIA) Continue Ozempic-->dose to 0.71m sq weekly Denies personal and family history of Medullary thyroid cancer (MTC) Patient enrolled in [employer] patient assistance program until 01/2022--shipment arrived today Tolerating well Will decrease metformin at f/u due to GFR 48 Consider SGLT2/Farxiga AT F/U GFR 48 Current glucose readings: fasting glucose: <150; DENIES hypoglycemia, post prandial glucose: <200 Libre 2 CGM order submitted to advanced diabetes supply (denied due to not injecting 3x daily as req'd by his insurance plan--specific to [employer] Order submitted to total medical supply via parachute, but it it likely that this will be denied as well No recent reports of hypoglycemic symptoms (initially-->now improved, now episodes, insulin reduced) Discussed meal planning options and Plate method for healthy eating Avoid sugary drinks and desserts Incorporate balanced protein, non starchy veggies, 1 serving of carbohydrate with each meal Increase water intake Increase physical activity as able Current exercise: n/a Educated on diet/lifestyle; insulin reduction; heart healthy diet due to hyperlipidemia Recommended libre due to hypoglycemia Assessed patient finances. Enrolled in the novo n[employer] patient assistance program-patient picked up shipment #2 boxes today  Patient Goals/Self-Care Activities patient will:  - take medications as prescribed  as evidenced by patient report and record review check glucose daily or if symptomatic, document, and provide at future appointments collaborate with provider on medication access solutions  Plan: 03/11/21  Subjective: Steven Osborne an 81y.o. year old male who is a primary patient of Rakes, LConnye Burkitt FNP.  The CCM team was consulted for assistance with disease management and care coordination needs.    Engaged with patient by telephone for follow up visit in response to provider referral for pharmacy case management and/or care coordination services.   Consent to Services:  The patient was given information about Chronic Care Management services, agreed to services, and gave verbal consent prior to initiation of services.  Please see initial visit note for detailed documentation.   Patient Care Team: RBaruch Gouty FNP as PCP - General (Family Medicine) PLavera Guise [facility] Pharmacist (Family Medicine)  Objective:  Lab Results  Component Value Date   CREATININE 1.48 (H) 04/10/2020   CREATININE 1.49 (H) 04/09/2020   CREATININE 1.42 (H) 04/08/2020    Lab Results  Component Value Date   HGBA1C 7.8 (H) 04/08/2020   Last diabetic Eye exam:  Lab Results  Component Value Date/Time   HMDIABEYEEXA Retinopathy (A) 09/09/2014 12:00 AM    Last diabetic Foot exam: No results found for: HMDIABFOOTEX      Component Value Date/Time   CHOL 257 (H) 04/09/2020 0210   TRIG 222 (H) 04/09/2020 0210   HDL 30 (L) 04/09/2020 0210   CHOLHDL 8.6 04/09/2020 0210   VLDL 44 (H) 04/09/2020 0210   LDLCALC 183 (H) 04/09/2020 0210   LDLDIRECT 129.5 01/01/2014 1358    Hepatic Function Latest Ref Rng & Units 04/09/2020 12/17/2017 09/24/2017  Total Protein 6.5 - 8.1 g/dL 6.3(L) 5.5(L) 6.9  Albumin 3.5 - 5.0 g/dL 3.2(L) 3.1(L) 3.7  AST 15 - 41 U/L 17 11(L) 11(L)  ALT 0 - 44 U/L _0 Alk Phosphatase 38 - 126 U/L 65 50 79  Total Bilirubin 0.3 - 1.2 mg/dL 1.1 0.8 0.9    Lab Results  Component  Value Date/Time   TSH 1.166 09/24/2017 04:39 PM   FREET4 0.81 (L) 09/24/2017 12:35 PM    CBC Latest Ref Rng & Units 04/10/2020 04/09/2020 04/08/2020  WBC 4.0 - 10.5 K/uL 8.1 8.1 7.4  Hemoglobin 13.0 - 17.0 g/dL 12.6(L) 13.6 14.3  Hematocrit 39.0 - 52.0 % 37.4(L) 38.7(L) 41.9  Platelets 150 - 400 K/uL 131(L) 145(L) 159    No results found for: VD25OH  Clinical ASCVD: Yes  The ASCVD Risk score (Arnett DK, et al., 2019) failed to calculate for the following reasons:   The patient has a prior MI or stroke diagnosis    Other: (CHADS2VASc if Afib, PHQ9 if depression, MMRC or CAT for COPD, ACT, DEXA)  Social History   Tobacco Use  Smoking Status Former   Types: Cigars  Smokeless Tobacco Former   Types: Chew   Quit date: 02/21/2014   BP Readings from Last 3 Encounters:  02/28/21 110/70  01/21/21 105/64  12/10/20 (!) 142/70   Pulse Readings from Last 3 Encounters:  01/21/21 60  12/10/20 (!) 50  05/12/20 80   Wt Readings from Last 3 Encounters:  02/25/21 210 lb (95.3 kg)  01/21/21 207 lb (93.9 kg)  12/10/20 220 lb (99.8 kg)    Assessment: Review of patient past medical history, allergies, medications, health status, including review of consultants reports, laboratory and other test data, was performed as part of comprehensive evaluation and provision of chronic care management services.   SDOH:  (Social Determinants of Health) assessments and interventions performed:    CCM Care Plan  Allergies  Allergen Reactions   Crestor [Rosuvastatin Calcium]     myalgia    Medications Reviewed Today     Reviewed by Lavera Guise, [facility] (Pharmacist) on 02/28/21 at 2344  Med List Status: <None>   Medication Order Taking? Sig Documenting Provider Last Dose Status Informant  amLODipine (NORVASC) 10 MG tablet [id number] No TAKE 1 TABLET EVERY DAY.n please keep upcoming appt in February 2023 with Steven Dopp, PA before anymore refills. Thank you Final Attempt Josue Hector, MD Taking  Active   aspirin EC 81 MG tablet [id number] No Take 81 mg by mouth daily. [provider] Taking Active Family Member  atorvastatin (LIPITOR) 20 MG tablet [id number] No Take 20 mg by mouth daily. [provider] Taking Active Family Member  carvedilol (COREG) 25 MG tablet [id number] No Take 25 mg by mouth 2 (two) times daily. [provider] Taking Active Family Member  citalopram (CELEXA) 20 MG tablet [id number] No Take 20 mg by mouth daily. [provider] Taking Active Family Member  clopidogrel (PLAVIX) 75 MG tablet [id number] No Take 1 tablet (75 mg total) by mouth daily. Kathyrn Drown D, NP Taking Active   Continuous Blood Gluc Receiver (FREESTYLE LIBRE 2 READER) [place] [id number] No 1 Device by Does not apply route in the morning, at noon, in the evening, and at bedtime.  Patient not taking: Reported on 02/25/2021   Baruch Gouty, FNP Not Taking Active   Continuous Blood Gluc Sensor (FREESTYLE LIBRE 2 SENSOR) [place] [id number] No 1 Device by Does not apply route 4 (four) times daily. Baruch Gouty, FNP Taking Active            Med Note (Kemani Demarais D  Sun Feb 28, 2021 11:43 PM) Unable to obtain due to humana requirements--needs to injection 3x daily or have repeated hypoglycemia  dorzolamide-timolol (COSOPT) 22.3-6.8 MG/ML ophthalmic solution [id number] No SMARTSIG:In Eye(s) [provider] Taking Active   furosemide (LASIX) 40 MG tablet [id number] No Take 40 mg by mouth daily. [provider] Taking Active Family Member  hydrALAZINE (APRESOLINE) 25 MG tablet [id number] No Take 25 mg by mouth 3 (three) times daily. [provider] Taking Active            Med Note (Georgiann Cocker, AMY E   Thu Feb 25, 2021  2:05 PM) Taking BID  insulin aspart protamine- aspart (NOVOLOG MIX 70/30) (70-30) 100 UNIT/ML injection [id number] No Inject 40 Units into the skin daily. [provider] Taking Active   isosorbide mononitrate (IMDUR) 30 MG 24 hr tablet  [id number] No Take 30 mg by mouth daily.  [provider] Taking Active Family Member           Med Note Kathrynn Ducking Sep 24, 2017  2:15 PM)    metFORMIN (GLUCOPHAGE) 500 MG tablet [id number] No Take 2 tablets (1,000 mg total) by mouth 2 (two) times daily with a meal. Rakes, Connye Burkitt, FNP Taking Active            Med Note Blanca Friend, Royce Macadamia   Tue Dec 15, 2020 11:35 AM) Says insurance won't pay for?  oxyCODONE (ROXICODONE) 15 MG immediate release tablet [id number] No Take 15 mg by mouth 4 (four) times daily as needed. [provider] Taking Active Family Member  pantoprazole (PROTONIX) 40 MG tablet [id number] No Take 1 tablet (40 mg total) by mouth 2 (two) times daily before a meal. Kathie Dike, MD Taking Active Family Member  Semaglutide,0.25 or 0.5MG/DOS, (OZEMPIC, 0.25 OR 0.5 MG/DOSE,) 2 MG/1.5ML SOPN [id number] No Inject 0.5 mg into the skin once a week. Baruch Gouty, FNP Taking Active            Med Note Blanca Friend, Omer Jack Feb 28, 2021 11:44 PM) Via novo nordisk patient assistance program  TRUE METRIX BLOOD GLUCOSE TEST test strip [id number] No SMARTSIG:Via Meter [provider] Taking Active   TRUEplus Lancets 33G [place] [id number] No  [provider] Taking Active   vitamin C (ASCORBIC ACID) 500 MG tablet [id number] No Take 500 mg by mouth daily. [provider] Taking Active   vitamin E 180 MG (400 UNITS) capsule [id number] No Take 400 Units by mouth daily. [provider] Taking Active             Patient Active Problem List   Diagnosis Date Noted   Moderate nonproliferative diabetic retinopathy of left eye with macular edema associated with type 2 diabetes mellitus ([place]) 12/10/2020   History of non-ST elevation myocardial infarction (NSTEMI) 12/10/2020   History of coronary angioplasty with insertion of stent 12/10/2020   Hx of CABG 12/10/2020   Prolonged QT interval 04/08/2020   GERD (gastroesophageal reflux disease)  04/08/2020   CKD stage 3 due to type 2 diabetes mellitus ([place]) 09/25/2017   Ischemic cardiomyopathy 68/12/5724   Chronic systolic CHF (congestive heart failure) ([place]) 09/24/2017   Thrombocytopenia ([place]) 09/24/2017   Diabetic macular edema ([place]) 01/20/2017   Absolute glaucoma, right eye 12/11/2015   Bilateral hearing loss 06/24/2015   Arthritis 01/22/2015   Type 2 diabetes mellitus, with long-term current use of insulin ([place]) 01/07/2015   Atherosclerotic heart disease of native coronary artery without  angina pectoris 02/25/2010   Hyperlipidemia associated with type 2 diabetes mellitus ([place]) 02/10/2010   Hypertension associated with diabetes ([place]) 02/10/2010   ARTERIOSCLEROSIS 02/10/2010   INTERMITTENT CLAUDICATION 02/10/2010    Immunization History  Administered Date(s) Administered   Fluad Quad(high Dose 65+) 04/10/2020, 12/10/2020   Influenza Split 01/21/2011   Influenza Whole 01/18/2010   Influenza, High Dose Seasonal PF 12/12/2016   Influenza,inj,Quad PF,6+ Mos 11/16/2012, 12/18/2017   Pneumococcal Polysaccharide-23 12/22/2008, 12/22/2008   Td 02/22/2007   Tdap 02/29/2004, 05/12/2020    Conditions to be addressed/monitored: HTN, HLD, and DMII  Care Plan : PHARMD MEDICATION MANAGEMENT  Updates made by Lavera Guise, RPH since 02/28/2021 12:00 AM     Problem: DISEASE PROGRESSION PREVENTION      Long-Range Goal: T2DM   Recent Progress: Not on track  Priority: High  Note:   Current Barriers:  Unable to independently afford treatment regimen Unable to achieve control of T2DM  Suboptimal therapeutic regimen for T2DM  Pharmacist Clinical Goal(s):  patient will verbalize ability to afford treatment regimen achieve control of T2DM as evidenced by GOAL<7% adhere to plan to optimize therapeutic regimen for T2DM as evidenced by report of adherence to recommended medication management changes through collaboration with PharmD and provider.   Interventions: 1:1  collaboration with Rakes, Connye Burkitt, FNP regarding development and update of comprehensive plan of care as evidenced by provider attestation and co-signature Inter-disciplinary care team collaboration (see longitudinal plan of care) Comprehensive medication review performed; medication list updated in electronic medical record  Diabetes: Goal on Track (progressing): YES. Uncontrolled-A1C improved to 7.8%, GFR 48; current treatment: 7ozempic, metformin;  Decrease insulin to tresiba 36 units daily due to hypoglycemia Goal to decrease insulin/d/c if able Decrease metformin due to GFR as able Continue ozempic-->dose to 0.19m sq weekly Denies personal and family history of Medullary thyroid cancer (MTC) Application submitted to [employer] patient assistance program --still awaiting shipment due to current back orders; order will ship to PCP office in 30-60 days; will continue to provide samples as needed and as able Current glucose readings: fasting glucose: <150; more recently having hypoglycemia, post prandial glucose: <200 Libre 2 CGM order submitted to advanced diabetes supply (denied due to not injecting 3x daily as req'd by his insurance plan--specific to [employer] Order submitted to total medical supply via parachute, but it it likely that this will be denied as well reports hypoglycemic symptoms Discussed meal planning options and Plate method for healthy eating Avoid sugary drinks and desserts Incorporate balanced protein, non starchy veggies, 1 serving of carbohydrate with each meal Increase water intake Increase physical activity as able Current exercise: n/a Educated on diet/lifestyle; insulin reduction; heart health diet due to hyperlipidemia Recommended libre due to hypoglycemia Assessed patient finances. Enrolled in the novo nordisk patient assistance program-awaiting ozempic shipment (current backorders)   Patient Goals/Self-Care Activities patient will:  - take medications as  prescribed as evidenced by patient report and record review check glucose daily or if symptomatic, document, and provide at future appointments collaborate with provider on medication access solutions      Medication Assistance:  OZEMPICobtained through [place].  Enrollment ends 02/20/22  [place]  [place] [address]Phone: [phone]: [facility] [facility] [place] [address]Phone: [phone]: [phone] Follow Up:  Patient agrees to Care Plan and Follow-up.  Plan: Telephone follow up appointment with care management team member scheduled for:  03/11/21   Regina Eck, PharmD, BCPS Clinical Pharmacist, [place]  II Phone [phone]

## 2021-03-11 ENCOUNTER — Ambulatory Visit: Payer: Medicare HMO | Admitting: Family Medicine

## 2021-03-11 ENCOUNTER — Ambulatory Visit: Payer: Medicare HMO | Admitting: Pharmacist

## 2021-03-11 ENCOUNTER — Encounter: Payer: Self-pay | Admitting: Family Medicine

## 2021-03-11 DIAGNOSIS — N183 Chronic kidney disease, stage 3 unspecified: Secondary | ICD-10-CM

## 2021-03-11 DIAGNOSIS — Z794 Long term (current) use of insulin: Secondary | ICD-10-CM

## 2021-03-11 DIAGNOSIS — E785 Hyperlipidemia, unspecified: Secondary | ICD-10-CM

## 2021-03-11 DIAGNOSIS — E1169 Type 2 diabetes mellitus with other specified complication: Secondary | ICD-10-CM

## 2021-03-12 ENCOUNTER — Ambulatory Visit: Payer: Medicare HMO | Admitting: Family Medicine

## 2021-03-12 ENCOUNTER — Encounter: Payer: Self-pay | Admitting: Family Medicine

## 2021-03-18 NOTE — Progress Notes (Signed)
Chronic Care Management Pharmacy Note  03/11/2021 Name:  Steven Osborne MRN:  [id number] DOB:  07-23-1940  Summary: T2DM, HLD, CKD  Recommendations/Changes made from today's visit:  Diabetes: Goal on Track (progressing): YES. Uncontrolled-A1C improved to 7.8%-->7.1%, GFR 48; current treatment: ozempic (d/c'd now), metformin, novolin 70/30; FARXIGA 32m new start Continue insulin Novolin 70/30 -->40 UNITS ONCE DAILY with BREAKFAST  Patient able to get via patient assistance program Regimen not ideal due to risk of hypoglycemia (although only taking once daily & makes sure to eat steady meals) Will work to get on basal insulin only with additional PO therapies for type 2 diabetes Goal to decrease insulin/d/c if able Decrease metformin due to GFR as able--GFR 48 (unclear if patient is actually taking) FARXIGA 5100mwas added per PCP PLAN TO INCREASE TO 10MG AS TOLERATED for full benefits Application submitted to az&me patient assistance program Will ship to patient's home in 3 month increments When prescribing refills ---> [facility] [place]; PATIENT WAS UNABLE TO EAT Denied personal and family history of Medullary thyroid cancer (MTC) Updated intolerance list and medication list Current glucose readings: fasting glucose: <150; more recently having hypoglycemia, post prandial glucose: <200 Libre 2 CGM order submitted to advanced diabetes supply (denied due to not injecting 3x daily as req'd by his insurance plan--[place] to [place] submitted to total medical supply via parachute; DENIED by insurance; unable to appeal  reports hypoglycemic symptoms Discussed meal planning options and Plate method for healthy eating Avoid sugary drinks and desserts Incorporate balanced protein, non starchy veggies, 1 serving of carbohydrate with each meal Increase water intake Increase physical activity as able Current exercise:  n/a Educated on diet/lifestyle; insulin reduction; heart healthy diet due to hyperlipidemia Recommended libre; denied on insurance; not able to appeal Assessed patient finances. APPLICATION SUBMITTED TO AZ&ME PATIENT ASSISTANCE PROGRAM AS ABOVE [place]  Patient Goals/Self-Care Activities patient will:  - take medications as prescribed as evidenced by patient report and record review check glucose daily or if symptomatic, document, and provide at future appointments collaborate with provider on medication access solutions  Plan: IN PERSON VISIT ON 04/20/21  Subjective: Steven Agostinos an [age].o. year old male who is a primary patient of Rakes, LiConnye BurkittFNP.  The CCM team was consulted for assistance with disease management and care coordination needs.    Engaged with patient by telephone for follow up visit in response to provider referral for pharmacy case management and/or care coordination services.   Consent to Services:  The patient was given information about Chronic Care Management services, agreed to services, and gave verbal consent prior to initiation of services.  Please see initial visit note for detailed documentation.   Patient Care Team: Steven GoutyFNP as PCP - General (Family Medicine) PrLavera GuiseRPInnovations Surgery Center LPs Pharmacist (Family Medicine)   Objective:  Lab Results  Component Value Date   CREATININE 1.99 (H) 03/24/2021   CREATININE 1.48 (H) 04/10/2020   CREATININE 1.49 (H) 04/09/2020    Lab Results  Component Value Date   HGBA1C 7.1 (H) 03/24/2021   Last diabetic Eye exam:  Lab Results  Component Value Date/Time   HMDIABEYEEXA Retinopathy (A) 09/09/2014 12:00 AM    Last diabetic Foot exam: No results found for: HMDIABFOOTEX      Component Value Date/Time   CHOL 160 03/24/2021 1005   TRIG 314 (H) 03/24/2021 1005   HDL 30 (  L) 03/24/2021 1005   CHOLHDL 5.3 (H) 03/24/2021 1005   CHOLHDL 8.6 04/09/2020 0210   VLDL 44 (H) 04/09/2020 0210    LDLCALC 79 03/24/2021 1005   LDLDIRECT 129.5 01/01/2014 1358    Hepatic Function Latest Ref Rng & Units 03/24/2021 04/09/2020 12/17/2017  Total Protein 6.0 - 8.5 g/dL 7.2 6.3(L) 5.5(L)  Albumin 3.7 - 4.7 g/dL 4.5 3.2(L) 3.1(L)  AST 0 - 40 IU/L 10 17 11(L)  ALT 0 - 44 IU/L _0 Alk Phosphatase 44 - 121 IU/L 78 65 50  Total Bilirubin 0.0 - 1.2 mg/dL 0.3 1.1 0.8   CBC Latest Ref Rng & Units 03/24/2021 04/10/2020 04/09/2020  WBC 3.4 - 10.8 x10E3/uL 7.7 8.1 8.1  Hemoglobin 13.0 - 17.7 g/dL 13.3 12.6(L) 13.6  Hematocrit 37.5 - 51.0 % 39.5 37.4(L) 38.7(L)  Platelets 150 - 450 x10E3/uL 137(L) 131(L) 145(L)  Clinical ASCVD: Yes  The ASCVD Risk score (Arnett DK, et al., 2019) failed to calculate for the following reasons:   The patient has a prior MI or stroke diagnosis    Other: (CHADS2VASc if Afib, PHQ9 if depression, MMRC or CAT for COPD, ACT, DEXA)  Social History   Tobacco Use  Smoking Status Former   Types: Cigars  Smokeless Tobacco Former   Types: Chew   Quit date: 02/21/2014   BP Readings from Last 3 Encounters:  03/24/21 103/61  02/28/21 110/70  01/21/21 105/64   Pulse Readings from Last 3 Encounters:  03/24/21 68  01/21/21 60  12/10/20 (!) 50   Wt Readings from Last 3 Encounters:  03/24/21 199 lb (90.3 kg)  02/25/21 210 lb (95.3 kg)  01/21/21 207 lb (93.9 kg)    Assessment: Review of patient past medical history, allergies, medications, health status, including review of consultants reports, laboratory and other test data, was performed as part of comprehensive evaluation and provision of chronic care management services.   SDOH:  (Social Determinants of Health) assessments and interventions performed:    CCM Care Plan  Allergies  Allergen Reactions   Crestor [Rosuvastatin Calcium]     myalgia   Ozempic (0.25 Or 0.5 Mg-Dose) [Semaglutide(0.25 Or 0.76m-Dos)]     GI intolerance; not able to eat    Medications Reviewed Today     Reviewed by RBaruch Gouty Osborne  (Family Nurse Practitioner) on 03/24/21 at 1FalfurriasList Status: <None>   Medication Order Taking? Sig Documenting Provider Last Dose Status Informant  amLODipine (NORVASC) 10 MG tablet [id number] TAKE 1 TABLET EVERY DAY.n please keep upcoming appt in February 2023 with SRichardson Dopp PA before anymore refills. Thank you Final Attempt NJosue Hector MD Taking Active   aspirin EC 81 MG tablet [id number] Take 81 mg by mouth daily. [provider] Taking Active Family Member  atorvastatin (LIPITOR) 20 MG tablet [id number] Take 20 mg by mouth daily. [provider] Taking Active Family Member  carvedilol (COREG) 25 MG tablet [id number] Take 25 mg by mouth 2 (two) times daily. [provider] Taking Active Family Member  citalopram (CELEXA) 20 MG tablet [id number] Take 20 mg by mouth daily. [provider] Taking Active Family Member  clopidogrel (PLAVIX) 75 MG tablet [id number] Take 1 tablet (75 mg total) by mouth daily. MKathyrn DrownD, NP Taking Active   dorzolamide-timolol (COSOPT) 22.3-6.8 MG/ML ophthalmic solution [id number] SMARTSIG:In Eye(s) [provider] Taking Active   furosemide (LASIX) 40 MG tablet [id number] Take 40 mg  by mouth daily. [provider] Taking Active Family Member  hydrALAZINE (APRESOLINE) 25 MG tablet [id number] Yes Take 25 mg by mouth 3 (three) times daily. [provider] Taking Active            Med Note (Georgiann Cocker, AMY E   Thu Feb 25, 2021  2:05 PM) Taking BID  insulin aspart protamine- aspart (NOVOLOG MIX 70/30) (70-30) 100 UNIT/ML injection [id number] Yes Inject 25 Units into the skin daily. [provider] Taking Active            Med Note Blanca Friend, Omer Jack Feb 28, 2021 11:45 PM) Via novo nordisk patient assistance program  isosorbide mononitrate (IMDUR) 30 MG 24 hr tablet [id number] Yes Take 30 mg by mouth daily.  [provider] Taking Active Family Member            Med Note Kathrynn Ducking Sep 24, 2017  2:15 PM)    metFORMIN (GLUCOPHAGE) 500 MG tablet [id number]  Take 2 tablets (1,000 mg total) by mouth 2 (two) times daily with a meal. Steven Gouty, Osborne  Expired 03/14/21 2359            Med Note (Seda Kronberg D   Tue Dec 15, 2020 11:35 AM) Says insurance won't pay for?  oxyCODONE (ROXICODONE) 15 MG immediate release tablet [id number] Yes Take 15 mg by mouth 4 (four) times daily as needed. [provider] Taking Active Family Member  pantoprazole (PROTONIX) 40 MG tablet [id number] Yes Take 1 tablet (40 mg total) by mouth 2 (two) times daily before a meal. Kathie Dike, MD Taking Active Family Member  Semaglutide,0.25 or 0.5MG/DOS, (OZEMPIC, 0.25 OR 0.5 MG/DOSE,) 2 MG/1.5ML SOPN [id number] Yes Inject 0.5 mg into the skin once a week. Steven Gouty, Osborne Taking Active            Med Note Blanca Friend, Omer Jack Feb 28, 2021 11:44 PM) Via novo nordisk patient assistance program  TRUE METRIX BLOOD GLUCOSE TEST test strip [id number] Yes SMARTSIG:Via Meter [provider] Taking Active   TRUEplus Lancets 33G [place] [id number] Yes  [provider] Taking Active   vitamin C (ASCORBIC ACID) 500 MG tablet [id number] Yes Take 500 mg by mouth daily. [provider] Taking Active   vitamin E 180 MG (400 UNITS) capsule [id number] Yes Take 400 Units by mouth daily. [provider] Taking Active             Patient Active Problem List   Diagnosis Date Noted   Moderate nonproliferative diabetic retinopathy of left eye with macular edema associated with type 2 diabetes mellitus ([place]) 12/10/2020   History of non-ST elevation myocardial infarction (NSTEMI) 12/10/2020   History of coronary angioplasty with insertion of stent 12/10/2020   Hx of CABG 12/10/2020   Prolonged QT interval 04/08/2020   GERD (gastroesophageal reflux disease) 04/08/2020   CKD stage 3 due to type 2 diabetes mellitus ([place]) 09/25/2017   Ischemic  cardiomyopathy 84/04/7541   Chronic systolic CHF (congestive heart failure) ([place]) 09/24/2017   Thrombocytopenia ([place]) 09/24/2017   Diabetic macular edema ([place]) 01/20/2017   Absolute glaucoma, right eye 12/11/2015   Bilateral hearing loss 06/24/2015   Arthritis 01/22/2015   Type 2 diabetes mellitus, with long-term current use of insulin ([place]) 01/07/2015   Atherosclerotic heart disease of native coronary artery without angina pectoris 02/25/2010   Hyperlipidemia associated with type 2 diabetes mellitus ([place]) 02/10/2010   Hypertension associated  with diabetes ([place]) 02/10/2010   ARTERIOSCLEROSIS 02/10/2010   INTERMITTENT CLAUDICATION 02/10/2010    Immunization History  Administered Date(s) Administered   Fluad Quad(high Dose 65+) 04/10/2020, 12/10/2020   Influenza Split 01/21/2011   Influenza Whole 01/18/2010   Influenza, High Dose Seasonal PF 12/12/2016   Influenza,inj,Quad PF,6+ Mos 11/16/2012, 12/18/2017   PNEUMOCOCCAL CONJUGATE-20 03/24/2021   Pneumococcal Polysaccharide-23 12/22/2008, 12/22/2008   Td 02/22/2007   Tdap 02/29/2004, 05/12/2020   Zoster Recombinat (Shingrix) 03/24/2021    Conditions to be addressed/monitored: DMII and CKD Stage 3A  Care Plan : PHARMD MEDICATION MANAGEMENT  Updates made by Steven Osborne, Osborne since 03/28/2021 12:00 AM     Problem: DISEASE PROGRESSION PREVENTION      Long-Range Goal: T2DM PHARMD GOAL   Recent Progress: Not on track  Priority: High  Note:   Current Barriers:  Unable to independently afford treatment regimen Unable to achieve control of T2DM  Suboptimal therapeutic regimen for T2DM  Pharmacist Clinical Goal(s):  patient will verbalize ability to afford treatment regimen achieve control of T2DM as evidenced by GOAL<7% adhere to plan to optimize therapeutic regimen for T2DM as evidenced by report of adherence to recommended medication management changes through collaboration with PharmD and provider.   Interventions: 1:1  collaboration with Rakes, Steven Burkitt, Osborne regarding development and update of comprehensive plan of care as evidenced by provider attestation and co-signature Inter-disciplinary care team collaboration (see longitudinal plan of care) Comprehensive medication review performed; medication list updated in electronic medical record  Diabetes: Goal on Track (progressing): YES. Uncontrolled-A1C improved to 7.8%, GFR 48; current treatment: ozempic, metformin, novolin 70/30;  Continue insulin 25 units twice daily with meals (Novolin 70/30) Patient was able to get via patient assistance program Regimen not ideal due to risk of hypoglycemia Will work to get on basal only with additional PO therapies for type 2 diabetes Goal to decrease insulin/d/c if able Decrease metformin due to GFR as able--GFR 48 Farxiga 56m was added per PCP Application submitted to az&me patient assistance program Will ship to patient's home in 3 month increments When prescribing refills ---> [place]; PATIENT WAS UNABLE TO EAT Denied personal and family history of Medullary thyroid cancer (MTC) Updated intolerance list and medication list Current glucose readings: fasting glucose: <150; more recently having hypoglycemia, post prandial glucose: <200 Libre 2 CGM order submitted to advanced diabetes supply (denied due to not injecting 3x daily as req'd by his insurance plan--[place] Order submitted to total medical supply via parachute; DENIED by insurance; unable to appeal  reports hypoglycemic symptoms Discussed meal planning options and Plate method for healthy eating Avoid sugary drinks and desserts Incorporate balanced protein, non starchy veggies, 1 serving of carbohydrate with each meal Increase water intake Increase physical activity as able Current exercise: n/a Educated on diet/lifestyle; insulin reduction; heart healthy diet due to  hyperlipidemia Recommended libre; denied on insurance; not able to appeal Assessed patient finances. APPLICATION SUBMITTED TO AZ&ME PATIENT ASSISTANCE PROGRAM AS ABOVE [place]   Patient Goals/Self-Care Activities patient will:  - take medications as prescribed as evidenced by patient report and record review check glucose daily or if symptomatic, document, and provide at future appointments collaborate with provider on medication access solutions      Medication Assistance: Application for az&me/FARXIGA  medication assistance program. in process.  Anticipated assistance start date TBD.  See plan of care for additional detail.  Patient's preferred  pharmacy is:  [facility], [place] [address] Phone: [phone] Fax: [place], [place] [address] Phone: [phone] Fax: [phone]  Uses pill box? No - N/A Pt endorses 80% compliance  Follow Up:  Patient agrees to Care Plan and Follow-up.  Plan: Face to Face appointment with care management team member scheduled for: 04/20/21   Regina Eck, PharmD, BCPS Clinical Pharmacist, [place]  II Phone [phone]

## 2021-03-23 DIAGNOSIS — N183 Chronic kidney disease, stage 3 unspecified: Secondary | ICD-10-CM

## 2021-03-23 DIAGNOSIS — E1122 Type 2 diabetes mellitus with diabetic chronic kidney disease: Secondary | ICD-10-CM

## 2021-03-23 DIAGNOSIS — E785 Hyperlipidemia, unspecified: Secondary | ICD-10-CM

## 2021-03-23 DIAGNOSIS — Z794 Long term (current) use of insulin: Secondary | ICD-10-CM

## 2021-03-23 DIAGNOSIS — E1169 Type 2 diabetes mellitus with other specified complication: Secondary | ICD-10-CM

## 2021-03-24 ENCOUNTER — Ambulatory Visit (INDEPENDENT_AMBULATORY_CARE_PROVIDER_SITE_OTHER): Payer: Medicare HMO | Admitting: Family Medicine

## 2021-03-24 ENCOUNTER — Encounter: Payer: Self-pay | Admitting: Family Medicine

## 2021-03-24 VITALS — BP 103/61 | HR 68 | Temp 96.9°F | Ht 70.0 in | Wt 199.0 lb

## 2021-03-24 DIAGNOSIS — E1159 Type 2 diabetes mellitus with other circulatory complications: Secondary | ICD-10-CM

## 2021-03-24 DIAGNOSIS — Z23 Encounter for immunization: Secondary | ICD-10-CM

## 2021-03-24 DIAGNOSIS — Z794 Long term (current) use of insulin: Secondary | ICD-10-CM

## 2021-03-24 DIAGNOSIS — E785 Hyperlipidemia, unspecified: Secondary | ICD-10-CM

## 2021-03-24 DIAGNOSIS — E1169 Type 2 diabetes mellitus with other specified complication: Secondary | ICD-10-CM

## 2021-03-24 DIAGNOSIS — E1122 Type 2 diabetes mellitus with diabetic chronic kidney disease: Secondary | ICD-10-CM

## 2021-03-24 DIAGNOSIS — I152 Hypertension secondary to endocrine disorders: Secondary | ICD-10-CM

## 2021-03-24 DIAGNOSIS — N183 Chronic kidney disease, stage 3 unspecified: Secondary | ICD-10-CM

## 2021-03-24 LAB — BAYER DCA HB A1C WAIVED: HB A1C (BAYER DCA - WAIVED): 7.1 % — ABNORMAL HIGH (ref 4.8–5.6)

## 2021-03-24 LAB — LIPID PANEL

## 2021-03-24 MED ORDER — DAPAGLIFLOZIN PROPANEDIOL 5 MG PO TABS
5.0000 mg | ORAL_TABLET | Freq: Every day | ORAL | 3 refills | Status: DC
Start: 2021-03-24 — End: 2021-04-02

## 2021-03-24 NOTE — Patient Instructions (Addendum)

## 2021-03-24 NOTE — Progress Notes (Signed)
Subjective:  Patient ID: Steven Osborne, male    DOB: 02/12/1941, 81 y.o.   MRN: [id number]  Patient Care Team: Baruch Gouty, FNP as PCP - General (Family Medicine) Lavera Guise, [facility] as Pharmacist (Family Medicine)   Chief Complaint:  Medical Management of Chronic Issues   HPI: Steven Osborne is a 81 y.o. male presenting on 03/24/2021 for Medical Management of Chronic Issues   Pt presents for reevaluation of multiple chronic illnesses including diabetes management. His A1C today is 7.1. He desires to discontinue the Ozempic due to having no appetite. He will go a full day or two without eating a full meal. He states his blood sugar at night is ranging for 100-130. Fasting blood ranges around 160s. He is taking 40u of 70/30 daily in the morning. He is not following with podiatry, monofilament test intact today. He is following with optho every 6 weeks. He is following with cardiology this month.  Blood pressure is well controlled. No headaches, chest pain, shortness of breath, or leg swelling. No changes in urine output.   Relevant past medical, surgical, family, and social history reviewed and updated as indicated.  Allergies and medications reviewed and updated. Data reviewed: Chart in Epic.   Past Medical History:  Diagnosis Date   ARTERIOSCLEROSIS 02/10/2010   CAD 02/25/2010   DIABETES TYPE 2 02/10/2010   Glaucoma    right eye   HYPERLIPIDEMIA NOS 02/10/2010   HYPERTENSION 02/10/2010   INTERMITTENT CLAUDICATION 02/10/2010   MI, old     Past Surgical History:  Procedure Laterality Date   BIOPSY  12/18/2017   Procedure: BIOPSY;  Surgeon: Danie Binder, MD;  [facility];  Service: Endoscopy;;   CORONARY ARTERY BYPASS GRAFT  2005   4 vessel bypass    CORONARY STENT INTERVENTION N/A 04/09/2020   Procedure: CORONARY STENT INTERVENTION;  Surgeon: Nelva Bush, MD;  Location: [facility];  Service: Cardiovascular;  Laterality: N/A;  OM-2    ESOPHAGOGASTRODUODENOSCOPY N/A 12/18/2017   Procedure: ESOPHAGOGASTRODUODENOSCOPY (EGD);  Surgeon: Danie Binder, MD;  [facility];  Service: Endoscopy;  Laterality: N/A;   INTRAVASCULAR IMAGING/OCT N/A 04/09/2020   Procedure: INTRAVASCULAR IMAGING/OCT;  Surgeon: Nelva Bush, MD;  Location: [place] [facility];  Service: Cardiovascular;  Laterality: N/A;   LEFT HEART CATH AND CORS/GRAFTS ANGIOGRAPHY N/A 04/09/2020   Procedure: LEFT HEART CATH AND CORS/GRAFTS ANGIOGRAPHY;  Surgeon: Nelva Bush, MD;  Location: [facility];  Service: Cardiovascular;  Laterality: N/A;    Social History   Socioeconomic History   Marital status: Divorced    Spouse name: Not on file   Number of children: 1   Years of education: Not on file   Highest education level: Not on file  Occupational History   Not on file  Tobacco Use   Smoking status: Former    Types: Cigars   Smokeless tobacco: Former    Types: Chew    Quit date: 02/21/2014  Vaping Use   Vaping Use: Never used  Substance and Sexual Activity   Alcohol use: No    Comment: as a teenager to late 20s, none since    Drug use: No   Sexual activity: Not on file  Other Topics Concern   Not on file  Social History Narrative   Divorced, lives aloneRegular exercise-yes   Daughter, Lynelle Smoke - lives in [place], Otila Kluver helps him a lot - helps him clean and cook,  handles medications, shopping, drives him to out of town appointments.   Social Determinants of Health   Financial Resource Strain: Low Risk    Difficulty of Paying Living Expenses: Not hard at all  Food Insecurity: No Food Insecurity   Worried About Charity fundraiser in the Last Year: Never true   [place] in the Last Year: Never true  Transportation Needs: No Transportation Needs   Lack of Transportation (Medical): No   Lack of Transportation (Non-Medical): No  Physical Activity: Sufficiently Active   Days of Exercise per Week: 7 days    Minutes of Exercise per Session: 30 min  Stress: No Stress Concern Present   Feeling of Stress : Not at all  Social Connections: Moderately Integrated   Frequency of Communication with Friends and Family: More than three times a week   Frequency of Social Gatherings with Friends and Family: More than three times a week   Attends Religious Services: More than 4 times per year   Active Member of [employer] or Organizations: Yes   Attends Archivist Meetings: More than 4 times per year   Marital Status: Widowed  Intimate Partner Violence: Not At Risk   Fear of Current or Ex-Partner: No   Emotionally Abused: No   Physically Abused: No   Sexually Abused: No    Outpatient Encounter Medications as of 03/24/2021  Medication Sig   amLODipine (NORVASC) 10 MG tablet TAKE 1 TABLET EVERY DAY.n please keep upcoming appt in February 2023 with Richardson Dopp, PA before anymore refills. Thank you Final Attempt   aspirin EC 81 MG tablet Take 81 mg by mouth daily.   atorvastatin (LIPITOR) 20 MG tablet Take 20 mg by mouth daily.   carvedilol (COREG) 25 MG tablet Take 25 mg by mouth 2 (two) times daily.   citalopram (CELEXA) 20 MG tablet Take 20 mg by mouth daily.   clopidogrel (PLAVIX) 75 MG tablet Take 1 tablet (75 mg total) by mouth daily.   dorzolamide-timolol (COSOPT) 22.3-6.8 MG/ML ophthalmic solution SMARTSIG:In Eye(s)   furosemide (LASIX) 40 MG tablet Take 40 mg by mouth daily.   hydrALAZINE (APRESOLINE) 25 MG tablet Take 25 mg by mouth 3 (three) times daily.   insulin aspart protamine- aspart (NOVOLOG MIX 70/30) (70-30) 100 UNIT/ML injection Inject 25 Units into the skin daily.   isosorbide mononitrate (IMDUR) 30 MG 24 hr tablet Take 30 mg by mouth daily.    oxyCODONE (ROXICODONE) 15 MG immediate release tablet Take 15 mg by mouth 4 (four) times daily as needed.   pantoprazole (PROTONIX) 40 MG tablet Take 1 tablet (40 mg total) by mouth 2 (two) times daily before a meal.   Semaglutide,0.25 or  0.5MG/DOS, (OZEMPIC, 0.25 OR 0.5 MG/DOSE,) 2 MG/1.5ML SOPN Inject 0.5 mg into the skin once a week.   TRUE METRIX BLOOD GLUCOSE TEST test strip SMARTSIG:Via Meter   TRUEplus Lancets 33G MISC    vitamin C (ASCORBIC ACID) 500 MG tablet Take 500 mg by mouth daily.   vitamin E 180 MG (400 UNITS) capsule Take 400 Units by mouth daily.   [DISCONTINUED] Continuous Blood Gluc Receiver (FREESTYLE LIBRE 2 READER) DEVI 1 Device by Does not apply route in the morning, at noon, in the evening, and at bedtime.   [DISCONTINUED] Continuous Blood Gluc Sensor (FREESTYLE LIBRE 2 SENSOR) MISC 1 Device by Does not apply route 4 (four) times daily.   metFORMIN (GLUCOPHAGE) 500 MG tablet Take 2 tablets (1,000 mg total) by mouth  2 (two) times daily with a meal.   No facility-administered encounter medications on file as of 03/24/2021.    Allergies  Allergen Reactions   Crestor [Rosuvastatin Calcium]     myalgia    Review of Systems  Constitutional:  Negative for activity change, appetite change, chills, diaphoresis, fatigue, fever and unexpected weight change.  HENT:  Positive for hearing loss.   Eyes:  Positive for visual disturbance. Negative for photophobia.       Following with optho for injections   Respiratory:  Negative for cough, chest tightness and shortness of breath.   Cardiovascular:  Negative for chest pain, palpitations and leg swelling.  Gastrointestinal:  Negative for abdominal pain, blood in stool, constipation, diarrhea, nausea and vomiting.  Endocrine: Negative.  Negative for polydipsia, polyphagia and polyuria.  Genitourinary:  Negative for decreased urine volume, difficulty urinating, dysuria, frequency and urgency.  Musculoskeletal:  Negative for arthralgias and myalgias.  Skin: Negative.   Allergic/Immunologic: Negative.   Neurological:  Positive for weakness. Negative for dizziness, tremors, seizures, syncope, facial asymmetry, speech difficulty, light-headedness, numbness and  headaches.  Hematological: Negative.   Psychiatric/Behavioral:  Negative for confusion, hallucinations, sleep disturbance and suicidal ideas.   All other systems reviewed and are negative.      Objective:  BP 103/61    Pulse 68    Temp (!) 96.9 F (36.1 C) (Temporal)    Ht 5' 10"  (1.778 m)    Wt 90.3 kg    SpO2 96%    BMI 28.55 kg/m    Wt Readings from Last 3 Encounters:  03/24/21 90.3 kg  02/25/21 95.3 kg  01/21/21 93.9 kg    Physical Exam Vitals and nursing note reviewed.  Constitutional:      Appearance: Normal appearance. He is normal weight.  HENT:     Head: Normocephalic and atraumatic.     Mouth/Throat:     Mouth: Mucous membranes are moist.  Eyes:     Pupils: Pupils are equal, round, and reactive to light.  Cardiovascular:     Rate and Rhythm: Normal rate and regular rhythm.     Pulses: Normal pulses.     Heart sounds: Normal heart sounds.  Pulmonary:     Effort: Pulmonary effort is normal.     Breath sounds: Normal breath sounds.  Feet:     Right foot:     Protective Sensation: 10 sites tested.  10 sites sensed.     Left foot:     Protective Sensation: 10 sites tested.  10 sites sensed.  Skin:    General: Skin is warm and dry.     Capillary Refill: Capillary refill takes less than 2 seconds.  Neurological:     General: No focal deficit present.     Mental Status: He is alert and oriented to person, place, and time. Mental status is at baseline.  Psychiatric:        Mood and Affect: Mood normal.        Behavior: Behavior normal.        Thought Content: Thought content normal.        Judgment: Judgment normal.    Results for orders placed or performed in visit on 01/21/21  Glucose Hemocue Waived  Result Value Ref Range   Glu Hemocue Waived 233 (H) 70 - 99 mg/dL       Pertinent labs & imaging results that were available during my care of the patient were reviewed by me and considered in my medical  decision making.  Assessment & Plan:  [place] was seen  today for medical management of chronic issues.  Diagnoses and all orders for this visit:  Type 2 diabetes mellitus with other specified complication, with long-term current use of insulin ([place]) -     Bayer DCA Hb A1c Waived -     CBC with Differential/Platelet -     Lipid panel -     CMP14+EGFR -Will discontinue Ozempic due to unable to tolerate due to GI side effects/loss of total appetite. Will add 64m FIrandaily.   Hyperlipidemia associated with type 2 diabetes mellitus ([place] -     Bayer DCA Hb A1c Waived -     CBC with Differential/Platelet -     Lipid panel -     CMP14+EGFR    Continue statin therapy.     CKD stage 3 due to type 2 diabetes mellitus (HCC) -     Bayer DCA Hb A1c Waived -     CBC with Differential/Platelet -     Lipid panel -     CMP14+EGFR Labs pending.   Hypertension associated with diabetes ([place] -     Bayer DCA Hb A1c Waived -     CBC with Differential/Platelet -     Lipid panel -     CMP14+EGFR Well controlled. No changes in regimen today.   Vaccinations updated today.    Continue all other maintenance medications.  Follow up plan: Return in about 3 months (around 06/21/2021), or if symptoms worsen or fail to improve, for DM.   Continue healthy lifestyle choices, including diet (rich in fruits, vegetables, and lean proteins, and low in salt and simple carbohydrates) and exercise (at least 30 minutes of moderate physical activity daily).  The above assessment and management plan was discussed with the patient. The patient verbalized understanding of and has agreed to the management plan. Patient is aware to call the clinic if they develop any new symptoms or if symptoms persist or worsen. Patient is aware when to return to the clinic for a follow-up visit. Patient educated on when it is appropriate to go to the emergency department.   RDeidre Ala NP-S  I personally was present during the history, physical exam, and medical  decision-making activities of this visit and have verified that the services and findings are accurately documented in the nurse practitioner student's note.  MMonia Pouch FNP-C [facility] [address]MAdelino Neway[place] [phone]

## 2021-03-25 LAB — CBC WITH DIFFERENTIAL/PLATELET
Basophils Absolute: 0.1 10*3/uL (ref 0.0–0.2)
Basos: 1 %
EOS (ABSOLUTE): 0.5 10*3/uL — ABNORMAL HIGH (ref 0.0–0.4)
Eos: 7 %
Hematocrit: 39.5 % (ref 37.5–51.0)
Hemoglobin: 13.3 g/dL (ref 13.0–17.7)
Immature Grans (Abs): 0 10*3/uL (ref 0.0–0.1)
Immature Granulocytes: 0 %
Lymphocytes Absolute: 1.9 10*3/uL (ref 0.7–3.1)
Lymphs: 25 %
MCH: 29 pg (ref 26.6–33.0)
MCHC: 33.7 g/dL (ref 31.5–35.7)
MCV: 86 fL (ref 79–97)
Monocytes Absolute: 0.7 10*3/uL (ref 0.1–0.9)
Monocytes: 9 %
Neutrophils Absolute: 4.5 10*3/uL (ref 1.4–7.0)
Neutrophils: 58 %
Platelets: 137 10*3/uL — ABNORMAL LOW (ref 150–450)
RBC: 4.58 x10E6/uL (ref 4.14–5.80)
RDW: 13 % (ref 11.6–15.4)
WBC: 7.7 10*3/uL (ref 3.4–10.8)

## 2021-03-25 LAB — CMP14+EGFR
ALT: 9 IU/L (ref 0–44)
AST: 10 IU/L (ref 0–40)
Albumin/Globulin Ratio: 1.7 (ref 1.2–2.2)
Albumin: 4.5 g/dL (ref 3.7–4.7)
Alkaline Phosphatase: 78 IU/L (ref 44–121)
BUN/Creatinine Ratio: 18 (ref 10–24)
BUN: 36 mg/dL — ABNORMAL HIGH (ref 8–27)
Bilirubin Total: 0.3 mg/dL (ref 0.0–1.2)
CO2: 24 mmol/L (ref 20–29)
Calcium: 9.3 mg/dL (ref 8.6–10.2)
Chloride: 102 mmol/L (ref 96–106)
Creatinine, Ser: 1.99 mg/dL — ABNORMAL HIGH (ref 0.76–1.27)
Globulin, Total: 2.7 g/dL (ref 1.5–4.5)
Glucose: 86 mg/dL (ref 70–99)
Potassium: 4.2 mmol/L (ref 3.5–5.2)
Sodium: 143 mmol/L (ref 134–144)
Total Protein: 7.2 g/dL (ref 6.0–8.5)
eGFR: 34 mL/min/{1.73_m2} — ABNORMAL LOW (ref >59)

## 2021-03-25 LAB — LIPID PANEL
Chol/HDL Ratio: 5.3 ratio — ABNORMAL HIGH (ref 0.0–5.0)
Cholesterol, Total: 160 mg/dL (ref 100–199)
HDL: 30 mg/dL — ABNORMAL LOW (ref >39)
LDL Chol Calc (NIH): 79 mg/dL (ref 0–99)
Triglycerides: 314 mg/dL — ABNORMAL HIGH (ref 0–149)
VLDL Cholesterol Cal: 51 mg/dL — ABNORMAL HIGH (ref 5–40)

## 2021-03-26 ENCOUNTER — Other Ambulatory Visit: Payer: Self-pay | Admitting: *Deleted

## 2021-03-26 DIAGNOSIS — E1122 Type 2 diabetes mellitus with diabetic chronic kidney disease: Secondary | ICD-10-CM

## 2021-03-28 NOTE — Patient Instructions (Signed)
Visit Information  Following are the goals we discussed today:  Current Barriers:  Unable to independently afford treatment regimen Unable to achieve control of T2DM  Suboptimal therapeutic regimen for T2DM  Pharmacist Clinical Goal(s):  patient will verbalize ability to afford treatment regimen achieve control of T2DM as evidenced by GOAL<7% adhere to plan to optimize therapeutic regimen for T2DM as evidenced by report of adherence to recommended medication management changes through collaboration with PharmD and provider.   Interventions: 1:1 collaboration with Rakes, Doralee Albino, FNP regarding development and update of comprehensive plan of care as evidenced by provider attestation and co-signature Inter-disciplinary care team collaboration (see longitudinal plan of care) Comprehensive medication review performed; medication list updated in electronic medical record  Diabetes: Goal on Track (progressing): YES. Uncontrolled-A1C improved to 7.8%, GFR 48; current treatment: ozempic, metformin, novolin 70/30;  Continue insulin 25 units twice daily with meals (Novolin 70/30) Patient was able to get via patient assistance program Regimen not ideal due to risk of hypoglycemia Will work to get on basal only with additional PO therapies for type 2 diabetes Goal to decrease insulin/d/c if able Decrease metformin due to GFR as able--GFR 48 Farxiga 5mg  was added per PCP [place] Will ship to patient's home in 3 month increments When prescribing refills ---> can escribe to [facility] pharmacy OZEMPIC WAS DISCONTINUED DUE TO GI SIDE EFFECTS; PATIENT WAS UNABLE TO EAT Denied personal and family history of Medullary thyroid cancer (MTC) Updated intolerance list and medication list Current glucose readings: fasting glucose: <150; more recently having hypoglycemia, post prandial glucose: <200 Libre 2 CGM order submitted to advanced diabetes supply (denied  due to not injecting 3x daily as req'd by his insurance plan--specific to ) Order submitted to [facility] via parachute; DENIED by insurance; unable to appeal  reports hypoglycemic symptoms Discussed meal planning options and Plate method for healthy eating Avoid sugary drinks and desserts Incorporate balanced protein, non starchy veggies, 1 serving of carbohydrate with each meal Increase water intake Increase physical activity as able Current exercise: n/a Educated on diet/lifestyle; insulin reduction; heart healthy diet due to hyperlipidemia Recommended libre; denied on insurance; not able to appeal Assessed patient finances. APPLICATION SUBMITTED TO [place] PATIENT ASSISTANCE PROGRAM AS ABOVE FOR FARXIGA NEW START   Patient Goals/Self-Care Activities patient will:  - take medications as prescribed as evidenced by patient report and record review check glucose daily or if symptomatic, document, and provide at future appointments collaborate with provider on medication access solutions   Plan: Telephone follow up appointment with care management team member scheduled for:  2/28  Signature 3/28, PharmD, BCPS Clinical Pharmacist, [facility]  II Phone [phone]  Please call the care guide team at [phone] if you need to cancel or reschedule your appointment.   The patient verbalized understanding of instructions, educational materials, and care plan provided today and agreed to receive a mailed copy of patient instructions, educational materials, and care plan.

## 2021-03-29 ENCOUNTER — Other Ambulatory Visit: Payer: Medicare HMO

## 2021-03-29 DIAGNOSIS — N183 Chronic kidney disease, stage 3 unspecified: Secondary | ICD-10-CM

## 2021-03-30 ENCOUNTER — Other Ambulatory Visit: Payer: Self-pay | Admitting: Family Medicine

## 2021-03-30 DIAGNOSIS — E1122 Type 2 diabetes mellitus with diabetic chronic kidney disease: Secondary | ICD-10-CM

## 2021-03-30 LAB — CMP14+EGFR
ALT: 7 IU/L (ref 0–44)
AST: 11 IU/L (ref 0–40)
Albumin/Globulin Ratio: 1.5 (ref 1.2–2.2)
Albumin: 4.2 g/dL (ref 3.7–4.7)
Alkaline Phosphatase: 74 IU/L (ref 44–121)
BUN/Creatinine Ratio: 15 (ref 10–24)
BUN: 29 mg/dL — ABNORMAL HIGH (ref 8–27)
Bilirubin Total: 0.4 mg/dL (ref 0.0–1.2)
CO2: 24 mmol/L (ref 20–29)
Calcium: 8.9 mg/dL (ref 8.6–10.2)
Chloride: 102 mmol/L (ref 96–106)
Creatinine, Ser: 1.91 mg/dL — ABNORMAL HIGH (ref 0.76–1.27)
Globulin, Total: 2.8 g/dL (ref 1.5–4.5)
Glucose: 84 mg/dL (ref 70–99)
Potassium: 4.2 mmol/L (ref 3.5–5.2)
Sodium: 140 mmol/L (ref 134–144)
Total Protein: 7 g/dL (ref 6.0–8.5)
eGFR: 35 mL/min/{1.73_m2} — ABNORMAL LOW (ref >59)

## 2021-03-30 NOTE — Progress Notes (Signed)
Referral placed.

## 2021-03-31 ENCOUNTER — Encounter: Payer: Self-pay | Admitting: Physician Assistant

## 2021-03-31 ENCOUNTER — Other Ambulatory Visit: Payer: Self-pay

## 2021-03-31 ENCOUNTER — Ambulatory Visit: Payer: Medicare HMO | Admitting: Physician Assistant

## 2021-03-31 ENCOUNTER — Other Ambulatory Visit: Payer: Self-pay | Admitting: *Deleted

## 2021-03-31 VITALS — BP 100/50 | HR 66 | Ht 70.0 in | Wt 198.8 lb

## 2021-03-31 DIAGNOSIS — E1122 Type 2 diabetes mellitus with diabetic chronic kidney disease: Secondary | ICD-10-CM

## 2021-03-31 DIAGNOSIS — I152 Hypertension secondary to endocrine disorders: Secondary | ICD-10-CM

## 2021-03-31 DIAGNOSIS — E1169 Type 2 diabetes mellitus with other specified complication: Secondary | ICD-10-CM | POA: Diagnosis not present

## 2021-03-31 DIAGNOSIS — N183 Chronic kidney disease, stage 3 unspecified: Secondary | ICD-10-CM

## 2021-03-31 DIAGNOSIS — I77810 Thoracic aortic ectasia: Secondary | ICD-10-CM

## 2021-03-31 DIAGNOSIS — E785 Hyperlipidemia, unspecified: Secondary | ICD-10-CM

## 2021-03-31 DIAGNOSIS — I25810 Atherosclerosis of coronary artery bypass graft(s) without angina pectoris: Secondary | ICD-10-CM

## 2021-03-31 DIAGNOSIS — E1159 Type 2 diabetes mellitus with other circulatory complications: Secondary | ICD-10-CM

## 2021-03-31 DIAGNOSIS — I5020 Unspecified systolic (congestive) heart failure: Secondary | ICD-10-CM

## 2021-03-31 MED ORDER — AMLODIPINE BESYLATE 5 MG PO TABS: 5.0000 mg | ORAL_TABLET | Freq: Every day | ORAL | 3 refills | Status: DC

## 2021-03-31 MED ORDER — EZETIMIBE 10 MG PO TABS: 10.0000 mg | ORAL_TABLET | Freq: Every day | ORAL | 3 refills | Status: DC

## 2021-03-31 NOTE — Patient Instructions (Addendum)
Medication Instructions:    DECREASE Amlodipine one ( 1) tablet by mouth ( 5 mg ) daily.  START Zetia one (1) tablet by mouth ( 10 mg ) daily.  *If you need a refill on your cardiac medications before your next appointment, please call your pharmacy*   Lab Work:  IN 3 MONTHS GO TO ANY LAB COR[facility]  Your physician recommends that you return for a FASTING lipid profile/CMET. You can come in anytime between 8:00-4:30 fasting from midnight the night before.    If you have labs (blood work) drawn today and your tests are completely normal, you will receive your results only by: MyChart Message (if you have MyChart) OR A paper copy in the mail If you have any lab test that is abnormal or we need to change your treatment, we will call you to review the results.   Testing/Procedures:  Your physician has requested that you have an echocardiogram. Echocardiography is a painless test that uses sound waves to create images of your heart. It provides your doctor with information about the size and shape of your heart and how well your hearts chambers and valves are working. This procedure takes approximately one hour. There are no restrictions for this procedure.    Follow-Up: At [facility], you and your health needs are our priority.  As part of our continuing mission to provide you with exceptional heart care, we have created designated Provider Care Teams.  These Care Teams include your primary Cardiologist (physician) and Advanced Practice Providers (APPs -  Physician Assistants and Nurse Practitioners) who all work together to provide you with the care you need, when you need it.  We recommend signing up for the patient portal called "MyChart".  Sign up information is provided on this After Visit Summary.  MyChart is used to connect with patients for Virtual Visits (Telemedicine).  Patients are able to view lab/test results, encounter notes, upcoming appointments, etc.  Non-urgent messages  can be sent to your provider as well.   To learn more about what you can do with MyChart, go to [email or web address].    Your next appointment:   6 month(s)  The format for your next appointment:   In Person  Provider:   Charlton Haws, MD     Other Instructions  Your physician wants you to follow-up in: 6 months with Dr. Eden Emms.  You will receive a reminder letter in the mail two months in advance. If you don't receive a letter, please call our office to schedule the follow-up appointment.  PLEASE SEND IN BLOOD PRESSURE READINGS X 2 WEEKS EITHER CALL OR USE MYCHART.

## 2021-03-31 NOTE — Assessment & Plan Note (Signed)
40 mm on echocardiogram last year.  Arrange follow-up echocardiogram.

## 2021-03-31 NOTE — Assessment & Plan Note (Signed)
LDL close to goal.  Triglycerides remain elevated.  Continue atorvastatin 20 mg daily.  He does have a history of intolerance to rosuvastatin.  Add ezetimibe 10 mg daily.  Obtain fasting lipids, CMET in 3 months.

## 2021-03-31 NOTE — Progress Notes (Signed)
Cardiology Office Note:    Date:  03/31/2021   ID:  Steven Osborne, DOB [birth year], MRN [id number]  PCP:  Baruch Gouty, [place] [facility] Cardiologist:  Jenkins Rouge, MD Cardiology APP:  Sharmon [facility]    Referring MD: Baruch Gouty, FNP   Chief Complaint:  F/u for CHF, CAD    Patient Profile: Coronary artery disease S/p CABG in 2005 Cath 11/16: Patent L-LAD and native RCA, LCx; S-RCA/S-OM/S-Dx 100 >> med Rx NSTEMI 2/22 s/p DES to OM2 (HFrEF) heart failure with reduced ejection fraction  Ischemic cardiomyopathy EF previously 30-35 Echo 04/2018: EF 40-45 Echo 2/22: EF 45-50 Hypertension Hyperlipidemia Statin intolerant; tolerating atorvastatin 20 Diabetes mellitus type 2 Chronic kidney disease stage III Dilated ascending aorta (40 mm) on echo 2/22 RBBB  Prior CV Studies: Cardiac catheterization 04-25-20 LAD mid 100 CTO; D1 ostial 100 CTO LCx mid 30; OM1 100 CTO; OM2 95 RCA mid 60; RPA V85 LIMA-LAD patent SVG-RPDA, SVG-OM1, SVG-D1 100 CTO PCI: 3.5 x 15 mm resolute Onyx DES to the OM 2  Echo 25-Apr-2020 EF 45-50, mild concentric LVH, G1 DD, mildly reduced RVSF, normal PASP, moderate LAE, mild RAE, trivial MR, dilated ascending aorta 40 mm Anterior/anteroseptal/apical lateral/anterolateral/apical HK    History of Present Illness:   Steven Osborne is a 81 y.o. male with the above problem list.  He was admitted in 2/22 with a non-STEMI.  Cardiac catheterization demonstrated multivessel CAD with chronic occlusion of the mid LAD, ostial D1 and proximal OM1 as well as 95% OM 2 stenosis, 60% mid RCA and 80-90% RPA V.  LIMA-LAD was patent.  Vein grafts all remained occluded.  He underwent PCI with DES to the OM2.  Repeat echocardiogram demonstrated EF 45-50.  He missed his follow-up appointment in March 2022.  We have not seen him since then.  Of note, he was previously followed by cardiology at [facility].  He returns for follow-up.  He is here with his caretaker.  He  has not had chest pain, shortness of breath, syncope, orthopnea, significant leg edema.  He does note worsening renal function and his PCP has referred him to nephrology.  He also sees a pain specialist for back pain.        Past Medical History:  Diagnosis Date   ARTERIOSCLEROSIS 02/10/2010   CAD 02/25/2010   DIABETES TYPE 2 02/10/2010   Glaucoma    right eye   HYPERLIPIDEMIA NOS 02/10/2010   HYPERTENSION 02/10/2010   INTERMITTENT CLAUDICATION 02/10/2010   MI, old    Current Medications: Current Meds  Medication Sig   aspirin EC 81 MG tablet Take 81 mg by mouth daily.   atorvastatin (LIPITOR) 20 MG tablet Take 20 mg by mouth daily.   carvedilol (COREG) 25 MG tablet Take 25 mg by mouth 2 (two) times daily.   citalopram (CELEXA) 20 MG tablet Take 20 mg by mouth daily.   clopidogrel (PLAVIX) 75 MG tablet Take 1 tablet (75 mg total) by mouth daily.   dapagliflozin propanediol (FARXIGA) 5 MG TABS tablet Take 1 tablet (5 mg total) by mouth daily before breakfast.   dorzolamide-timolol (COSOPT) 22.3-6.8 MG/ML ophthalmic solution Place 1 drop into both eyes 2 (two) times daily.   ezetimibe (ZETIA) 10 MG tablet Take 1 tablet (10 mg total) by mouth daily.   furosemide (LASIX) 40 MG tablet Take 40 mg by mouth daily.   hydrALAZINE (APRESOLINE) 25 MG tablet Take 25 mg by mouth 2 (two) times daily.  insulin aspart protamine- aspart (NOVOLOG MIX 70/30) (70-30) 100 UNIT/ML injection Inject 40 Units into the skin daily with breakfast.   isosorbide mononitrate (IMDUR) 30 MG 24 hr tablet Take 30 mg by mouth daily.    oxyCODONE (ROXICODONE) 15 MG immediate release tablet Take 15 mg by mouth 4 (four) times daily as needed.   pantoprazole (PROTONIX) 40 MG tablet Take 1 tablet (40 mg total) by mouth 2 (two) times daily before a meal.   TRUE METRIX BLOOD GLUCOSE TEST test strip SMARTSIG:Via Meter   TRUEplus Lancets 33G MISC    vitamin C (ASCORBIC ACID) 500 MG tablet Take 500 mg by mouth daily.   vitamin E  180 MG (400 UNITS) capsule Take 400 Units by mouth daily.   [DISCONTINUED] amLODipine (NORVASC) 10 MG tablet Take 10 mg by mouth daily.    Allergies:   Crestor [rosuvastatin calcium] and Ozempic (0.25 or 0.5 mg-dose) [semaglutide(0.25 or 0.5m-dos)]   Social History   Tobacco Use   Smoking status: Former    Types: Cigars   Smokeless tobacco: Former    Types: Chew    Quit date: 02/21/2014  Vaping Use   Vaping Use: Never used  Substance Use Topics   Alcohol use: No    Comment: as a teenager to late 20s, none since    Drug use: No    Family Hx: The patient's family history includes Diabetes in his brother, father, mother, and sister; Heart disease in his father and mother; Hypertension in an other family member; Stroke in his father. There is no history of Colon cancer or Colon polyps.  Review of Systems  Gastrointestinal:  Negative for hematochezia.  Genitourinary:  Negative for hematuria.    EKGs/Labs/Other Test Reviewed:    EKG:  EKG is   ordered today.  The ekg ordered today demonstrates NSR, HR 60, PACs, right bundle branch block, QTc 468, no change from prior tracing  Recent Labs: 04/08/2020: B Natriuretic Peptide 265.0; Magnesium 1.8 03/24/2021: Hemoglobin 13.3; Platelets 137 03/29/2021: ALT 7; BUN 29; Creatinine, Ser 1.91; Potassium 4.2; Sodium 140   Recent Lipid Panel Recent Labs    04/09/20 0210 03/24/21 1005  CHOL 257* 160  TRIG 222* 314*  HDL 30* 30*  VLDL 44*  --   LDLCALC 183* 79     Risk Assessment/Calculations:         Physical Exam:    VS:  BP (!) 100/50 (BP Location: Right Arm, Patient Position: Sitting, Cuff Size: Normal)    Pulse 66    Ht 5' 10"  (1.778 m)    Wt 198 lb 12.8 oz (90.2 kg)    SpO2 94%    BMI 28.52 kg/m     Wt Readings from Last 3 Encounters:  03/31/21 198 lb 12.8 oz (90.2 kg)  03/24/21 199 lb (90.3 kg)  02/25/21 210 lb (95.3 kg)    Constitutional:      Appearance: Healthy appearance. Not in distress.  Neck:     Vascular: JVD  normal.  Pulmonary:     Effort: Pulmonary effort is normal.     Breath sounds: No wheezing. No rales.  Cardiovascular:     Normal rate. Regular rhythm. Normal S1. Normal S2.      Murmurs: There is no murmur.  Edema:    Peripheral edema absent.  Abdominal:     Palpations: Abdomen is soft.  Skin:    General: Skin is warm and dry.  Neurological:     General: No focal deficit present.  Mental Status: Alert and oriented to person, place and time.     Cranial Nerves: Cranial nerves are intact.        ASSESSMENT & PLAN:   CAD (coronary artery disease) History of CABG in 2005.  He was admitted last February with a non-STEMI treated with a DES to the OM 2.  All of his vein grafts are occluded.  His LIMA to the LAD is patent.  He is currently doing well without angina.  Given the severity of his disease, I have recommended continuing dual antiplatelet therapy.  Continue aspirin 81 mg daily, clopidogrel 75 mg daily, carvedilol 25 mg twice daily, atorvastatin 20 mg daily.  Follow-up in 6 months.  HFmrEF (heart failure with milldy reduced EF) EF has been as low as 30-35.  Echocardiogram last February demonstrated EF 45-50.  He is NYHA IIb.  Volume status is currently stable.  He is not on ACE/ARB/ARN I secondary to CKD.  Continue carvedilol 25 mg twice daily, dapagliflozin 5 mg daily, hydralazine 25 mg twice daily, isosorbide mononitrate 30 mg daily.  Given low blood pressure and chronic kidney disease, I would avoid spironolactone at this time.  Ascending aorta dilation (HCC) 40 mm on echocardiogram last year.  Arrange follow-up echocardiogram.  Hypertension associated with diabetes ([place]) Blood pressure running somewhat low.  Decrease amlodipine to 5 mg daily.  I have asked him and his caretaker to send blood pressure readings after 1 week.  Continue carvedilol 25 mg twice daily, hydralazine 25 mg twice daily, isosorbide mononitrate 30 mg daily.  Hyperlipidemia associated with type 2 diabetes  mellitus ([place]) LDL close to goal.  Triglycerides remain elevated.  Continue atorvastatin 20 mg daily.  He does have a history of intolerance to rosuvastatin.  Add ezetimibe 10 mg daily.  Obtain fasting lipids, CMET in 3 months.  CKD stage 3 due to type 2 diabetes mellitus (HCC) Recent creatinine 1.91.  He is being referred to nephrology.          Dispo:  Return in about 6 months (around 09/28/2021) for Routine follow up in 6 months with Dr. Johnsie Cancel. .   Medication Adjustments/Labs and Tests Ordered: Current medicines are reviewed at length with the patient today.  Concerns regarding medicines are outlined above.  Tests Ordered: Orders Placed This Encounter  Procedures   Comp Met (CMET)   Lipid Profile   EKG 12-Lead   ECHOCARDIOGRAM COMPLETE   Medication Changes: Meds ordered this encounter  Medications   amLODipine (NORVASC) 5 MG tablet    Sig: Take 1 tablet (5 mg total) by mouth daily.    Dispense:  90 tablet    Refill:  3   ezetimibe (ZETIA) 10 MG tablet    Sig: Take 1 tablet (10 mg total) by mouth daily.    Dispense:  90 tablet    Refill:  3   Signed, Richardson Dopp, PA-C  03/31/2021 4:40 PM    [facility] [address], [place], [place]  [address] Phone: [phone]; Fax: [phone]

## 2021-03-31 NOTE — Assessment & Plan Note (Signed)
Blood pressure running somewhat low.  Decrease amlodipine to 5 mg daily.  I have asked him and his caretaker to send blood pressure readings after 1 week.  Continue carvedilol 25 mg twice daily, hydralazine 25 mg twice daily, isosorbide mononitrate 30 mg daily.

## 2021-03-31 NOTE — Assessment & Plan Note (Signed)
EF has been as low as 30-35.  Echocardiogram last February demonstrated EF 45-50.  He is NYHA IIb.  Volume status is currently stable.  He is not on ACE/ARB/ARN I secondary to CKD.  Continue carvedilol 25 mg twice daily, dapagliflozin 5 mg daily, hydralazine 25 mg twice daily, isosorbide mononitrate 30 mg daily.  Given low blood pressure and chronic kidney disease, I would avoid spironolactone at this time.

## 2021-03-31 NOTE — Assessment & Plan Note (Signed)
History of CABG in 2005.  He was admitted last February with a non-STEMI treated with a DES to the OM 2.  All of his vein grafts are occluded.  His LIMA to the LAD is patent.  He is currently doing well without angina.  Given the severity of his disease, I have recommended continuing dual antiplatelet therapy.  Continue aspirin 81 mg daily, clopidogrel 75 mg daily, carvedilol 25 mg twice daily, atorvastatin 20 mg daily.  Follow-up in 6 months.

## 2021-03-31 NOTE — Assessment & Plan Note (Signed)
Recent creatinine 1.91.  He is being referred to nephrology.

## 2021-04-02 ENCOUNTER — Telehealth: Payer: Self-pay | Admitting: Pharmacist

## 2021-04-02 DIAGNOSIS — E1169 Type 2 diabetes mellitus with other specified complication: Secondary | ICD-10-CM

## 2021-04-02 MED ORDER — DAPAGLIFLOZIN PROPANEDIOL 10 MG PO TABS: 10.0000 mg | ORAL_TABLET | Freq: Every day | ORAL | 4 refills | Status: AC

## 2021-04-02 NOTE — Telephone Encounter (Signed)
Application for farxiga 10mg  daily sent to [employer] patient assistance program Escribed rx to [facility] per program

## 2021-04-08 ENCOUNTER — Telehealth: Payer: Self-pay | Admitting: Family Medicine

## 2021-04-08 NOTE — Telephone Encounter (Signed)
No samples, pt aware 

## 2021-04-09 NOTE — Progress Notes (Signed)
Received notification from [employer] regarding approval for [facility]. Patient assistance approved from 04/08/21 to 02/20/22.  Medication should ship to pt's home in 7-10 business days  Phone: [phone]

## 2021-04-20 ENCOUNTER — Ambulatory Visit (INDEPENDENT_AMBULATORY_CARE_PROVIDER_SITE_OTHER): Payer: Medicare HMO | Admitting: Pharmacist

## 2021-04-20 DIAGNOSIS — E1122 Type 2 diabetes mellitus with diabetic chronic kidney disease: Secondary | ICD-10-CM

## 2021-04-20 DIAGNOSIS — E1169 Type 2 diabetes mellitus with other specified complication: Secondary | ICD-10-CM

## 2021-04-20 DIAGNOSIS — N183 Chronic kidney disease, stage 3 unspecified: Secondary | ICD-10-CM

## 2021-04-20 DIAGNOSIS — Z794 Long term (current) use of insulin: Secondary | ICD-10-CM

## 2021-04-20 MED ORDER — METFORMIN HCL ER 500 MG PO TB24: 500.0000 mg | ORAL_TABLET | Freq: Two times a day (BID) | ORAL | 3 refills | Status: DC

## 2021-04-20 NOTE — Progress Notes (Signed)
Chronic Care Management Pharmacy Note  2/28/Osborne Name:  Steven Osborne MRN:  [id number] DOB:  [birth year]  Summary: T2DM  Recommendations/Changes made from today's visit: Diabetes: Goal on Track (progressing): YES. Uncontrolled-A1C improved to 7.8%-->7.1%, GFR 48; current treatment: ozempic (d/c'd now), metformin, novolin 70/30; FARXIGA 5m Continue insulin Novolin 70/30 -->80 UNITS ONCE DAILY with BREAKFAST  Patient able to get via patient assistance program (plan to switch to pens) Regimen not ideal due to risk of hypoglycemia (although only taking once daily & makes sure to eat steady meals) Will work to get on basal insulin only with additional PO therapies for type 2 diabetes Goal to decrease insulin/d/c if able DECREASE metformin due to GFR as able--GFR 48 INCREASE FARXIGA TO 185FYDAILY Application submitted to az&me patient assistance program Will ship to patient's home in 3 month increments When prescribing refills ---> can escribe to [facility] pharmacy  Subjective: Steven Osborne an 81y.o. year old male who is a primary patient of Rakes, Steven Burkitt Osborne.  The CCM team was consulted for assistance with disease management and care coordination needs.    Engaged with patient face to face for follow up visit in response to provider referral for pharmacy case management and/or care coordination services.   Consent to Services:  The patient was given information about Chronic Care Management services, agreed to services, and gave verbal consent prior to initiation of services.  Please see initial visit note for detailed documentation.   Patient Care Team: Rakes, Steven Burkitt Osborne as PCP - General (Family Medicine) Steven Hector MD as PCP - Cardiology (Cardiology) Steven Osborne (Family Medicine) Steven Osborne (Cardiology)  Objective:  Lab Results  Component Value Date   CREATININE 1.91 (H) 02/06/Osborne   CREATININE 1.99 (H)  02/01/Osborne   CREATININE 1.48 (H) 04/10/2020    Lab Results  Component Value Date   HGBA1C 7.1 (H) 02/01/Osborne   Last diabetic Eye exam:  Lab Results  Component Value Date/Time   HMDIABEYEEXA Retinopathy (A) 09/09/2014 12:00 AM    Last diabetic Foot exam: No results found for: HMDIABFOOTEX      Component Value Date/Time   CHOL 160 02/01/Osborne 1005   TRIG 314 (H) 02/01/Osborne 1005   HDL 30 (L) 02/01/Osborne 1005   CHOLHDL 5.3 (H) 02/01/Osborne 1005   CHOLHDL 8.6 04/09/2020 0210   VLDL 44 (H) 04/09/2020 0210   LDLCALC 79 02/01/Osborne 1005   LDLDIRECT 129.5 01/01/2014 1358    Hepatic Function Latest Ref Rng & Units 2/6/Osborne 2/1/Osborne 04/09/2020  Total Protein 6.0 - 8.5 g/dL 7.0 7.2 6.3(L)  Albumin 3.7 - 4.7 g/dL 4.2 4.5 3.2(L)  AST 0 - 40 IU/L _0 ALT 0 - 44 IU/L _1 Alk Phosphatase 44 - 121 IU/L 74 78 65  Total Bilirubin 0.0 - 1.2 mg/dL 0.4 0.3 1.1    Lab Results  Component Value Date/Time   TSH 1.166 09/24/2017 04:39 PM   FREET4 0.81 (L) 09/24/2017 12:35 PM    CBC Latest Ref Rng & Units 2/1/Osborne 04/10/2020 04/09/2020  WBC 3.4 - 10.8 x10E3/uL 7.7 8.1 8.1  Hemoglobin 13.0 - 17.7 g/dL 13.3 12.6(L) 13.6  Hematocrit 37.5 - 51.0 % 39.5 37.4(L) 38.7(L)  Platelets 150 - 450 x10E3/uL 137(L) 131(L) 145(L)    No results found for: VD25OH  Clinical ASCVD: Yes  The ASCVD Risk score (Arnett DK, et al., 2019) failed to calculate for the following reasons:  The patient has a prior MI or stroke diagnosis    Other: (CHADS2VASc if Afib, PHQ9 if depression, MMRC or CAT for COPD, ACT, DEXA)  Social History   Tobacco Use  Smoking Status Former   Types: Cigars  Smokeless Tobacco Former   Types: Chew   Quit date: 02/21/2014   BP Readings from Last 3 Encounters:  03/31/21 (!) 100/50  03/24/21 103/61  02/28/21 110/70   Pulse Readings from Last 3 Encounters:  03/31/21 66  03/24/21 68  01/21/21 60   Wt Readings from Last 3 Encounters:  03/31/21 198 lb 12.8 oz (90.2 kg)   03/24/21 199 lb (90.3 kg)  02/25/21 210 lb (95.3 kg)    Assessment: Review of patient past medical history, allergies, medications, health status, including review of consultants reports, laboratory and other test data, was performed as part of comprehensive evaluation and provision of chronic care management services.   SDOH:  (Social Determinants of Health) assessments and interventions performed:    CCM Care Plan  Allergies  Allergen Reactions   Crestor [Rosuvastatin Calcium]     myalgia   Ozempic (0.25 Or 0.5 Mg-Dose) [Semaglutide(0.25 Or 0.65m-Dos)]     GI intolerance; not able to eat    Medications Reviewed Today     [facility] on 04/22/21 at 1525  Med List Status: <None>   Medication Order Taking? Sig Documenting Provider Last Dose Status Informant  amLODipine (NORVASC) 5 MG tablet [id number] Take 1 tablet (5 mg total) by mouth daily. Steven DoppT, [place] Active   aspirin EC 81 MG tablet [id number] Take 81 mg by mouth daily. [provider] Taking Active Family Member  atorvastatin (LIPITOR) 20 MG tablet [id number] Take 20 mg by mouth daily. [provider] Taking Active Family Member  carvedilol (COREG) 25 MG tablet [id number] Take 25 mg by mouth 2 (two) times daily. [provider] Taking Active Family Member  citalopram (CELEXA) 20 MG tablet [id number] Take 20 mg by mouth daily. [provider] Taking Active Family Member  clopidogrel (PLAVIX) 75 MG tablet [id number] Take 1 tablet (75 mg total) by mouth daily. Steven Osborne Taking Active   dapagliflozin propanediol (FARXIGA) 10 MG TABS tablet [id number] Take 1 tablet (10 mg total) by mouth daily before breakfast. Steven Osborne  Active            Med Note (Steven Osborne Steven MaroonMar 2, Osborne  3:24 PM) Via AZ&me patient assistance program   dorzolamide-timolol (COSOPT) 22.3-6.8 MG/ML ophthalmic solution [id number] Place 1 drop  into both eyes 2 (two) times daily. [provider] Taking Active   ezetimibe (ZETIA) 10 MG tablet [id number] Take 1 tablet (10 mg total) by mouth daily. Steven DoppT, PA-C  Active   furosemide (LASIX) 40 MG tablet [id number] Take 40 mg by mouth daily. [provider] Taking Active Family Member  hydrALAZINE (APRESOLINE) 25 MG tablet [id number] Take 25 mg by mouth 2 (two) times daily. [provider] Taking Active            Med Note (Olena Heckle MICHELE   Wed Feb 8, Osborne  3:22 PM)    insulin aspart protamine- aspart (NOVOLOG MIX 70/30) (70-30) 100 UNIT/ML injection [id number] Inject 80 Units into the skin daily with breakfast. [provider] Taking Active            Med Note (  Parthenia Ames Steven Osborne  3:24 PM) Via AZ&me patient assistance program   isosorbide mononitrate (IMDUR) 30 MG 24 hr tablet [id number] No Take 30 mg by mouth daily.  [provider] Taking Active Family Member           Med Note Kathrynn Ducking Sep 24, 2017  2:15 PM)    metFORMIN (GLUCOPHAGE-XR) 500 MG 24 hr tablet [id number] Yes Take 1 tablet (500 mg total) by mouth 2 (two) times daily with a meal. Rakes, Connye Burkitt, Osborne  Active   oxyCODONE (ROXICODONE) 15 MG immediate release tablet [id number] No Take 15 mg by mouth 4 (four) times daily as needed. [provider] Taking Active Family Member  pantoprazole (PROTONIX) 40 MG tablet [id number] No Take 1 tablet (40 mg total) by mouth 2 (two) times daily before a meal. Kathie Dike, MD Taking Active Family Member  TRUE METRIX BLOOD GLUCOSE TEST test strip [id number] No SMARTSIG:Via Meter [provider] Taking Active   TRUEplus Lancets 33G [place] [id number] No  [provider] Taking Active   vitamin C (ASCORBIC ACID) 500 MG tablet [id number] No Take 500 mg by mouth daily. [provider] Taking Active   vitamin E 180 MG (400 UNITS) capsule [id number] No Take 400 Units by mouth daily.  [provider] Taking Active             Patient Active Problem List   Diagnosis Date Noted   HFmrEF (heart failure with milldy reduced EF) 02/08/Osborne   Ascending aorta dilation (HCC) 02/08/Osborne   Moderate nonproliferative diabetic retinopathy of left eye with macular edema associated with type 2 diabetes mellitus ([place]) 12/10/2020   History of non-ST elevation myocardial infarction (NSTEMI) 12/10/2020   History of coronary angioplasty with insertion of stent 12/10/2020   Hx of CABG 12/10/2020   Prolonged QT interval 04/08/2020   GERD (gastroesophageal reflux disease) 04/08/2020   CKD stage 3 due to type 2 diabetes mellitus ([place]) 09/25/2017   Ischemic cardiomyopathy 55/73/2202   Chronic systolic CHF (congestive heart failure) ([place]) 09/24/2017   Thrombocytopenia ([place]) 09/24/2017   Diabetic macular edema ([place]) 01/20/2017   Absolute glaucoma, right eye 12/11/2015   Bilateral hearing loss 06/24/2015   Arthritis 01/22/2015   Type 2 diabetes mellitus, with long-term current use of insulin ([place]) 01/07/2015   CAD (coronary artery disease) 02/25/2010   Hyperlipidemia associated with type 2 diabetes mellitus ([place]) 02/10/2010   Hypertension associated with diabetes ([place]) 02/10/2010   ARTERIOSCLEROSIS 02/10/2010   INTERMITTENT CLAUDICATION 02/10/2010    Immunization History  Administered Date(s) Administered   Fluad Quad(high Dose 65+) 04/10/2020, 12/10/2020   Influenza Split 01/21/2011   Influenza Whole 01/18/2010   Influenza, High Dose Seasonal PF 12/12/2016   Influenza,inj,Quad PF,6+ Mos 11/16/2012, 12/18/2017   PNEUMOCOCCAL CONJUGATE-20 02/01/Osborne   Pneumococcal Polysaccharide-23 12/22/2008, 12/22/2008   Td 02/22/2007   Tdap 02/29/2004, 05/12/2020   Zoster Recombinat (Shingrix) 02/01/Osborne    Conditions to be addressed/monitored: CAD, HLD, and DMII  Care Plan : PHARMD MEDICATION MANAGEMENT  Updates made by Lavera Guise, RPH since 3/2/Osborne 12:00 AM      Problem: DISEASE PROGRESSION PREVENTION      Long-Range Goal: T2DM PHARMD GOAL   Recent Progress: Not on track  Priority: High  Note:   Current Barriers:  Unable to independently afford treatment regimen Unable to achieve control of T2DM  Suboptimal therapeutic regimen for T2DM  Osborne Clinical Goal(s):  patient will verbalize ability  to afford treatment regimen achieve control of T2DM as evidenced by GOAL<7% adhere to plan to optimize therapeutic regimen for T2DM as evidenced by report of adherence to recommended medication management changes through collaboration with PharmD and provider.   Interventions: 1:1 collaboration with Rakes, Connye Burkitt, Osborne regarding development and update of comprehensive plan of care as evidenced by provider attestation and co-signature Inter-disciplinary care team collaboration (see longitudinal plan of care) Comprehensive medication review performed; medication list updated in electronic medical record  Diabetes: Goal on Track (progressing): YES. Uncontrolled-A1C improved to 7.8%-->7.1%, GFR 48; current treatment: ozempic (d/c'd now), metformin, novolin 70/30; FARXIGA 25m Continue insulin Novolin 70/30 -->80 UNITS ONCE DAILY with BREAKFAST  Patient able to get via patient assistance program (plan to switch to pens) Regimen not ideal due to risk of hypoglycemia (although only taking once daily & makes sure to eat steady meals) Will work to get on basal insulin only with additional PO therapies for type 2 diabetes Goal to decrease insulin/d/c if able DECREASE metformin due to GFR as able--GFR 48 INCREASE FARXIGA TO 132YEDAILY Application submitted to az&me patient assistance program Will ship to patient's home in 3 month increments When prescribing refills ---> [place] [facility] [place]; PATIENT WAS UNABLE TO EAT Denied personal and family history of Medullary thyroid cancer (MTC) Updated  intolerance list and medication list Current glucose readings: fasting glucose: <150; more recently having hypoglycemia, post prandial glucose: <200 Libre 2 CGM order submitted to [facility] diabetes supply (denied due to not injecting 3x daily as req'd by his insurance plan--[place] Order submitted to [employer] [facility] [employer] via parachute; DENIED by insurance; unable to appeal DENIES hypoglycemic symptoms Discussed meal planning options and Plate method for healthy eating Avoid sugary drinks and desserts Incorporate balanced protein, non starchy veggies, 1 serving of carbohydrate with each meal Increase water intake Increase physical activity as able Current exercise: n/a Educated on diet/lifestyle; insulin reduction; heart healthy diet due to hyperlipidemia Recommended libre; denied on insurance; not able to appeal Assessed patient finances. APPLICATION SUBMITTED TO [place]&ME PATIENT ASSISTANCE PROGRAM AS ABOVE [place]  Patient Goals/Self-Care Activities patient will:  - take medications as prescribed as evidenced by patient report and record review check glucose daily or if symptomatic, document, and provide at future appointments collaborate with provider on medication access solutions      Medication Assistance:  FARXIGA obtained through [place] medication assistance program.  Enrollment ends 02/20/22 INSULIN OBTAINED VIA [place] Patient's preferred pharmacy is:  [address] [address]Phone: [phone]: [address]Phone: [phone]: [phone] [facility] [place]Phone: [phone]: [phone] Follow Up:  Patient agrees to Care Plan and Follow-up.  Plan: Telephone follow up  appointment with care management team member scheduled for:  3 WEEKS   JRegina Eck PharmD, BCPS Clinical [place], [place] Phone [phone]

## 2021-04-22 NOTE — Patient Instructions (Signed)
Visit Information  Following are the goals we discussed today:  Diabetes: Goal on Track (progressing): YES. Uncontrolled-A1C improved to 7.8%-->7.1%, GFR 48; current treatment: ozempic (d/c'd now), metformin, novolin 70/30; FARXIGA 10mg  Continue insulin Novolin 70/30 -->80 UNITS ONCE DAILY with BREAKFAST  Patient able to get via patient assistance program (plan to switch to pens) Regimen not ideal due to risk of hypoglycemia (although only taking once daily & makes sure to eat steady meals) Will work to get on basal insulin only with additional PO therapies for type 2 diabetes Goal to decrease insulin/d/c if able DECREASE metformin due to GFR as able--GFR 48 INCREASE FARXIGA TO 10MG  DAILY Application submitted to az&me patient assistance program Will ship to patient's home in 3 month increments When prescribing refills ---> can escribe to [facility] pharmacy    Plan: Telephone follow up appointment with care management team member scheduled for:  3 WEEKS  Signature 04-11-1970, PharmD, BCPS Clinical Pharmacist, [facility]  II Phone [phone]   Please call the care guide team at [phone] if you need to cancel or reschedule your appointment.   The patient verbalized understanding of instructions, educational materials, and care plan provided today and declined offer to receive copy of patient instructions, educational materials, and care plan.

## 2021-05-03 ENCOUNTER — Other Ambulatory Visit: Payer: Self-pay | Admitting: *Deleted

## 2021-05-05 ENCOUNTER — Telehealth: Payer: Self-pay

## 2021-05-05 DIAGNOSIS — E1169 Type 2 diabetes mellitus with other specified complication: Secondary | ICD-10-CM

## 2021-05-05 MED ORDER — INSULIN ASPART PROT & ASPART (70-30 MIX) 100 UNIT/ML ~~LOC~~ SUSP: 80.0000 [IU] | Freq: Every day | SUBCUTANEOUS | 1 refills | Status: AC

## 2021-05-05 MED ORDER — BLOOD GLUCOSE METER KIT: PACK | 0 refills | Status: AC

## 2021-05-05 NOTE — Telephone Encounter (Signed)
Patient needs refill of his Novolog 70/30 sent to [facility].  He also would like a blood glucose meter and supplies sent to [facility].   ?

## 2021-05-05 NOTE — Telephone Encounter (Signed)
No answer, no voicemail.

## 2021-05-05 NOTE — Telephone Encounter (Signed)
Have sent in medication and glucose monitoring kit ?

## 2021-05-06 ENCOUNTER — Other Ambulatory Visit: Payer: Medicare HMO

## 2021-05-17 ENCOUNTER — Telehealth: Payer: Self-pay | Admitting: Family Medicine

## 2021-05-17 NOTE — Telephone Encounter (Signed)
Patient came by the office today and needed help with his meter. Patient had the battery in wrong and once that was corrected the meter started working correctly. Patient is questioning his novolog 70/30 he states he has had to buy several bottles from [facility] since he was last here. He is wanting to know if we can do something to help him with this. ?

## 2021-05-21 NOTE — Telephone Encounter (Signed)
Attempts to contact pt without a return call in over 3 days, will close encounter. °

## 2021-05-21 NOTE — Telephone Encounter (Signed)
Left message informing patient of novo nordisk enrollment status. Suggested pt come to office to sign paperwork and bring proof of income. Gave my direct line to call with any PAP questions [phone]). ?

## 2021-05-21 NOTE — Telephone Encounter (Signed)
Can you let patient know what to bring in to me? ---i'm off next week, but can process when I get back ?

## 2021-05-24 NOTE — Progress Notes (Signed)
?Cardiology Office Note:   ? ?Date:  05/26/2021  ? ?ID:  Steven Osborne, DOB 04/20/1940, MRN [id number] ? ?PCP:  Steven Gouty, FNP  ?[facility] Providers ?Cardiologist:  Steven Rouge, MD ?Cardiology APP:  Steven Shi, PA-C  ?   ?Referring MD: Steven Gouty, FNP  ? ?Chief Complaint:  No chief complaint on file. ?  ? ?Patient Profile: ?Coronary artery disease ?S/p CABG in 2005 ?Cath 11/16: Patent L-LAD and native RCA, LCx; S-RCA/S-OM/S-Dx 100 >> med Rx ?NSTEMI 2/22 s/p DES to OM2 ?(HFrEF) heart failure with reduced ejection fraction  ?Ischemic cardiomyopathy ?EF previously 30-35 ?Echo 04/2018: EF 40-45 ?Echo 2/22: EF 45-50 ?Hypertension ?Hyperlipidemia ?Statin intolerant; tolerating atorvastatin 20 ?Diabetes mellitus type 2 ?Chronic kidney disease stage III ?Dilated ascending aorta (40 mm) on echo 2/22 ?RBBB ? ?Prior CV Studies: ?Cardiac catheterization 04/09/2020 ?LAD mid 100 CTO; D1 ostial 100 CTO ?LCx mid 30; OM1 100 CTO; OM2 95 ?RCA mid 45; RPA V85 ?LIMA-LAD patent ?SVG-RPDA, SVG-OM1, SVG-D1 100 CTO ?PCI: 3.5 x 15 mm resolute Onyx DES to the OM 2 ? ?Echo 04/09/2020 ?EF 45-50, mild concentric LVH, G1 DD, mildly reduced RVSF, normal PASP, moderate LAE, mild RAE, trivial MR, dilated ascending aorta 40 mm ?Anterior/anteroseptal/apical lateral/anterolateral/apical HK ?  ? ?History of Present Illness:   ?Steven Osborne is a 81 y.o. male with the above problem list.  He was admitted in 2/22 with a non-STEMI.  Cardiac catheterization demonstrated multivessel CAD with chronic occlusion of the mid LAD, ostial D1 and proximal OM1 as well as 95% OM 2 stenosis, 60% mid RCA and 80-90% RPA V.  LIMA-LAD was patent.  Vein grafts all remained occluded.  He underwent PCI with DES to the OM2.  Repeat echocardiogram demonstrated EF 45-50.  He missed his follow-up appointment in March 2022.  We have not seen him since then.  Of note, he was previously followed by cardiology at [facility]. ? ?He returns for follow-up.  He is here with his  caretaker.  He has not had chest pain, shortness of breath, syncope, orthopnea, significant leg edema.   ? ?Comorbidity include CRF Baseline CR around 1.91 03/29/21  ? ?Last visit Norvasc dose decreased and started on Zetia 03/31/21  ? ?Needs refills His aid has not been able to help him as much lately and some of his meds ran out  ?   ?Past Medical History:  ?Diagnosis Date  ? ARTERIOSCLEROSIS 02/10/2010  ? CAD 02/25/2010  ? DIABETES TYPE 2 02/10/2010  ? Glaucoma   ? right eye  ? HYPERLIPIDEMIA NOS 02/10/2010  ? HYPERTENSION 02/10/2010  ? INTERMITTENT CLAUDICATION 02/10/2010  ? MI, old   ? ?Current Medications: ?Current Meds  ?Medication Sig  ? amLODipine (NORVASC) 5 MG tablet Take 1 tablet (5 mg total) by mouth daily.  ? aspirin EC 81 MG tablet Take 81 mg by mouth daily.  ? atorvastatin (LIPITOR) 20 MG tablet Take 20 mg by mouth daily.  ? blood glucose meter kit and supplies Dispense based on patient and insurance preference. Use up to four times daily as directed. (FOR ICD-10 E10.9, E11.9).  ? carvedilol (COREG) 25 MG tablet Take 25 mg by mouth 2 (two) times daily.  ? citalopram (CELEXA) 20 MG tablet Take 20 mg by mouth daily.  ? clopidogrel (PLAVIX) 75 MG tablet Take 1 tablet (75 mg total) by mouth daily.  ? dapagliflozin propanediol (FARXIGA) 10 MG TABS tablet Take 1 tablet (10 mg total) by mouth daily before breakfast.  ?  dorzolamide-timolol (COSOPT) 22.3-6.8 MG/ML ophthalmic solution Place 1 drop into both eyes 2 (two) times daily.  ? ezetimibe (ZETIA) 10 MG tablet Take 1 tablet (10 mg total) by mouth daily.  ? furosemide (LASIX) 40 MG tablet Take 40 mg by mouth daily.  ? hydrALAZINE (APRESOLINE) 25 MG tablet Take 25 mg by mouth 2 (two) times daily.  ? insulin aspart protamine- aspart (NOVOLOG MIX 70/30) (70-30) 100 UNIT/ML injection Inject 0.8 mLs (80 Units total) into the skin daily with breakfast.  ? isosorbide mononitrate (IMDUR) 30 MG 24 hr tablet Take 30 mg by mouth daily.   ? oxyCODONE (ROXICODONE) 15 MG  immediate release tablet Take 15 mg by mouth 4 (four) times daily as needed.  ? pantoprazole (PROTONIX) 40 MG tablet Take 1 tablet (40 mg total) by mouth 2 (two) times daily before a meal.  ? TRUE METRIX BLOOD GLUCOSE TEST test strip SMARTSIG:Via Meter  ? TRUEplus Lancets 33G MISC   ? vitamin C (ASCORBIC ACID) 500 MG tablet Take 500 mg by mouth daily.  ? vitamin E 180 MG (400 UNITS) capsule Take 400 Units by mouth daily.  ?  ?Allergies:   Crestor [rosuvastatin calcium] and Ozempic (0.25 or 0.5 mg-dose) [semaglutide(0.25 or 0.39m-dos)]  ? ?Social History  ? ?Tobacco Use  ? Smoking status: Former  ?  Types: Cigars  ? Smokeless tobacco: Former  ?  Types: Chew  ?  Quit date: 02/21/2014  ?Vaping Use  ? Vaping Use: Never used  ?Substance Use Topics  ? Alcohol use: No  ?  Comment: as a teenager to late 20s, none since   ? Drug use: No  ?  ?Family Hx: ?The patient's family history includes Diabetes in his brother, father, mother, and sister; Heart disease in his father and mother; Hypertension in an other family member; Stroke in his father. There is no history of Colon cancer or Colon polyps. ? ?Review of Systems  ?Gastrointestinal:  Negative for hematochezia.  ?Genitourinary:  Negative for hematuria.   ? ?EKGs/Labs/Other Test Reviewed:   ? ?EKG:  05/26/2021 NSR, HR 60, PACs, right bundle branch block, QTc 468, no change from prior tracing ? ?Recent Labs: ?03/24/2021: Hemoglobin 13.3; Platelets 137 ?03/29/2021: ALT 7; BUN 29; Creatinine, Ser 1.91; Potassium 4.2; Sodium 140  ? ?Recent Lipid Panel ?Recent Labs  ?  03/24/21 ?1005  ?CHOL 160  ?TRIG 314*  ?HDL 30*  ?[place]79  ?  ? ?Risk Assessment/Calculations:   ?  ?    ?Physical Exam:   ? ?VS:  BP 110/60   Pulse (!) 55   Ht _0  (1.778 m)   Wt 208 lb (94.3 kg)   SpO2 96%   BMI 29.84 kg/m?    ? ?Wt Readings from Last 3 Encounters:  ?05/26/21 208 lb (94.3 kg)  ?03/31/21 198 lb 12.8 oz (90.2 kg)  ?03/24/21 199 lb (90.3 kg)  ?  ?Affect appropriate ?Healthy:  appears stated  age ?HEENT: normal ?Neck supple with no adenopathy ?JVP normal no bruits no thyromegaly ?Lungs clear with no wheezing and good diaphragmatic motion ?Heart:  S1/S2 no murmur, no rub, gallop or click ?PMI normal ?Abdomen: benighn, BS positve, no tenderness, no AAA ?no bruit.  No HSM or HJR ?Distal pulses intact with no bruits ?No edema ?Neuro non-focal ?Skin warm and dry ?No muscular weakness ?  ?    ?ASSESSMENT & PLAN:   ?No problem-specific Assessment & Plan notes found for this encounter. ?  ?CAD (coronary artery disease) ?History  of CABG in 2005.  He was admitted last February with a non-STEMI treated with a DES to the OM 2.  All of his vein grafts are occluded.  His LIMA to the LAD is patent.  He is currently doing well without angina.  Given the severity of his disease, I have recommended continuing dual antiplatelet therapy.  Continue aspirin 81 mg daily, clopidogrel 75 mg daily, carvedilol 25 mg twice daily, atorvastatin 20 mg daily.  Follow-up in 6 months. ?  ?HFmrEF (heart failure with milldy reduced EF) ?EF has been as low as 30-35.  Echocardiogram last February demonstrated EF 45-50.  He is NYHA IIb.  Volume status is currently stable.  He is not on ACE/ARB/ARN I secondary to CKD.  Continue carvedilol 25 mg twice daily, dapagliflozin 5 mg daily, hydralazine 25 mg twice daily, isosorbide mononitrate 30 mg daily.  Given low blood pressure and chronic kidney disease, I would avoid spironolactone at this time. ?  ?Ascending aorta dilation (HCC) ?40 mm on echocardiogram last year.  Arrange follow-up echocardiogram. ?  ?Hypertension associated with diabetes ([place]) ?Blood pressure running somewhat low.  Decrease amlodipine to 5 mg daily.  I have asked him and his caretaker to send blood pressure readings after 1 week.  Continue carvedilol 25 mg twice daily, hydralazine 25 mg twice daily, isosorbide mononitrate 30 mg daily. ?  ?Hyperlipidemia associated with type 2 diabetes mellitus ([place]) ?LDL close to goal.   Triglycerides remain elevated.  Continue atorvastatin 20 mg daily.and Zetia ?  ?CKD stage 3 due to type 2 diabetes mellitus ([place]) ?Recent creatinine 1.91.  He is being referred to nephrology. ?       ? ?Medication Adjust

## 2021-05-26 ENCOUNTER — Encounter (INDEPENDENT_AMBULATORY_CARE_PROVIDER_SITE_OTHER): Payer: Self-pay

## 2021-05-26 ENCOUNTER — Ambulatory Visit: Payer: Medicare HMO | Admitting: Cardiovascular Disease

## 2021-05-26 ENCOUNTER — Encounter: Payer: Self-pay | Admitting: Cardiovascular Disease

## 2021-05-26 VITALS — BP 110/60 | HR 55 | Ht 70.0 in | Wt 208.0 lb

## 2021-05-26 DIAGNOSIS — E1159 Type 2 diabetes mellitus with other circulatory complications: Secondary | ICD-10-CM | POA: Diagnosis not present

## 2021-05-26 DIAGNOSIS — E1169 Type 2 diabetes mellitus with other specified complication: Secondary | ICD-10-CM

## 2021-05-26 DIAGNOSIS — I25810 Atherosclerosis of coronary artery bypass graft(s) without angina pectoris: Secondary | ICD-10-CM

## 2021-05-26 DIAGNOSIS — I5020 Unspecified systolic (congestive) heart failure: Secondary | ICD-10-CM

## 2021-05-26 DIAGNOSIS — E785 Hyperlipidemia, unspecified: Secondary | ICD-10-CM

## 2021-05-26 DIAGNOSIS — I152 Hypertension secondary to endocrine disorders: Secondary | ICD-10-CM

## 2021-05-26 MED ORDER — CLOPIDOGREL BISULFATE 75 MG PO TABS: 75.0000 mg | ORAL_TABLET | Freq: Every day | ORAL | 3 refills | Status: AC

## 2021-05-26 MED ORDER — EZETIMIBE 10 MG PO TABS: 10.0000 mg | ORAL_TABLET | Freq: Every day | ORAL | 3 refills | Status: AC

## 2021-05-26 MED ORDER — AMLODIPINE BESYLATE 5 MG PO TABS: 5.0000 mg | ORAL_TABLET | Freq: Every day | ORAL | 3 refills | Status: AC

## 2021-05-26 NOTE — Patient Instructions (Signed)
Medication Instructions:  °Your physician recommends that you continue on your current medications as directed. Please refer to the Current Medication list given to you today. ° °*If you need a refill on your cardiac medications before your next appointment, please call your pharmacy* ° °Lab Work: °If you have labs (blood work) drawn today and your tests are completely normal, you will receive your results only by: °MyChart Message (if you have MyChart) OR °A paper copy in the mail °If you have any lab test that is abnormal or we need to change your treatment, we will call you to review the results. ° °Testing/Procedures: °None ordered today ° °Follow-Up: °At [facility], you and your health needs are our priority.  As part of our continuing mission to provide you with exceptional heart care, we have created designated Provider Care Teams.  These Care Teams include your primary Cardiologist (physician) and Advanced Practice Providers (APPs -  Physician Assistants and Nurse Practitioners) who all work together to provide you with the care you need, when you need it. ° °We recommend signing up for the patient portal called "MyChart".  Sign up information is provided on this After Visit Summary.  MyChart is used to connect with patients for Virtual Visits (Telemedicine).  Patients are able to view lab/test results, encounter notes, upcoming appointments, etc.  Non-urgent messages can be sent to your provider as well.   °To learn more about what you can do with MyChart, go to [email or web address].   ° °Your next appointment:   °1 year(s) ° °The format for your next appointment:   °In Person ° °Provider:   °Peter Nishan, MD { ° °

## 2021-06-02 ENCOUNTER — Telehealth: Payer: Medicare HMO

## 2021-06-02 ENCOUNTER — Telehealth: Payer: Self-pay | Admitting: Pharmacist

## 2021-06-02 LAB — HM DIABETES EYE EXAM

## 2021-06-02 NOTE — Telephone Encounter (Signed)
Multiple messages left for patient re: patient assistance application ?New income documents are needed to process patient's insulin patient assistance ?We need a recent bank statement, 1099 or social security benefits letter ?

## 2021-06-10 ENCOUNTER — Ambulatory Visit (INDEPENDENT_AMBULATORY_CARE_PROVIDER_SITE_OTHER): Payer: Medicare HMO

## 2021-06-10 DIAGNOSIS — E785 Hyperlipidemia, unspecified: Secondary | ICD-10-CM

## 2021-06-10 DIAGNOSIS — N183 Chronic kidney disease, stage 3 unspecified: Secondary | ICD-10-CM

## 2021-06-10 DIAGNOSIS — I5020 Unspecified systolic (congestive) heart failure: Secondary | ICD-10-CM

## 2021-06-10 DIAGNOSIS — I152 Hypertension secondary to endocrine disorders: Secondary | ICD-10-CM

## 2021-06-10 DIAGNOSIS — E1122 Type 2 diabetes mellitus with diabetic chronic kidney disease: Secondary | ICD-10-CM

## 2021-06-10 DIAGNOSIS — E1169 Type 2 diabetes mellitus with other specified complication: Secondary | ICD-10-CM | POA: Diagnosis not present

## 2021-06-10 DIAGNOSIS — I77810 Thoracic aortic ectasia: Secondary | ICD-10-CM

## 2021-06-10 DIAGNOSIS — I25810 Atherosclerosis of coronary artery bypass graft(s) without angina pectoris: Secondary | ICD-10-CM

## 2021-06-10 DIAGNOSIS — E1159 Type 2 diabetes mellitus with other circulatory complications: Secondary | ICD-10-CM | POA: Diagnosis not present

## 2021-06-10 LAB — ECHOCARDIOGRAM COMPLETE
AR max vel: 2.52 cm2
AV Area VTI: 2.22 cm2
AV Area mean vel: 2.37 cm2
AV Mean grad: 3 mmHg
AV Peak grad: 5.2 mmHg
Ao pk vel: 1.14 m/s
Area-P 1/2: 1.91 cm2
S' Lateral: 3.49 cm

## 2021-06-14 ENCOUNTER — Telehealth: Payer: Self-pay | Admitting: Cardiovascular Disease

## 2021-06-14 NOTE — Telephone Encounter (Signed)
Returned call to pt, left a message to call back.  ?

## 2021-06-14 NOTE — Telephone Encounter (Signed)
Patient's caregiver is returning RMA's call for his Echo results. Please advise.  ?

## 2021-06-15 ENCOUNTER — Telehealth: Payer: Medicare HMO

## 2021-06-15 NOTE — Telephone Encounter (Signed)
Pt / caregiver, Otila Kluver, has been made aware of pt's echocardiogram results. ?See result note. ?

## 2021-07-01 ENCOUNTER — Encounter: Payer: Self-pay | Admitting: Family Medicine

## 2021-07-01 ENCOUNTER — Telehealth: Payer: Self-pay | Admitting: Family Medicine

## 2021-07-01 ENCOUNTER — Ambulatory Visit (INDEPENDENT_AMBULATORY_CARE_PROVIDER_SITE_OTHER): Payer: Medicare HMO | Admitting: Family Medicine

## 2021-07-01 VITALS — BP 101/60 | HR 54 | Temp 98.6°F | Ht 70.0 in | Wt 202.0 lb

## 2021-07-01 DIAGNOSIS — E1159 Type 2 diabetes mellitus with other circulatory complications: Secondary | ICD-10-CM | POA: Diagnosis not present

## 2021-07-01 DIAGNOSIS — E1122 Type 2 diabetes mellitus with diabetic chronic kidney disease: Secondary | ICD-10-CM | POA: Diagnosis not present

## 2021-07-01 DIAGNOSIS — Z23 Encounter for immunization: Secondary | ICD-10-CM

## 2021-07-01 DIAGNOSIS — Z794 Long term (current) use of insulin: Secondary | ICD-10-CM

## 2021-07-01 DIAGNOSIS — E785 Hyperlipidemia, unspecified: Secondary | ICD-10-CM

## 2021-07-01 DIAGNOSIS — I152 Hypertension secondary to endocrine disorders: Secondary | ICD-10-CM

## 2021-07-01 DIAGNOSIS — E1169 Type 2 diabetes mellitus with other specified complication: Secondary | ICD-10-CM | POA: Diagnosis not present

## 2021-07-01 DIAGNOSIS — N183 Type 2 diabetes mellitus with diabetic chronic kidney disease: Secondary | ICD-10-CM

## 2021-07-01 LAB — BAYER DCA HB A1C WAIVED: HB A1C (BAYER DCA - WAIVED): 7.8 % — ABNORMAL HIGH (ref 4.8–5.6)

## 2021-07-01 NOTE — Patient Instructions (Addendum)
Continue to monitor your blood sugars as we discussed and record them. Bring the log to your next appointment.  Take your medications as directed.   ? ?Goal Blood glucose:  ?  Fasting (before meals) = 80 to 130 ?  Within 2 hours of eating = less than 180 ? ?Novolog 70/30 80 units daily  ? ?Understanding your Hemoglobin A1c: 7.8 today ? ? ? ? ?Diabetes Mellitus and Nutrition ? ? ? ?I think that you would greatly benefit from seeing a nutritionist. If this is something you are interested in, please call Dr Jenne Campus at [phone] to schedule an appointment. ? ? ?When you have diabetes (diabetes mellitus), it is very important to have healthy eating habits because your blood sugar (glucose) levels are greatly affected by what you eat and drink. Eating healthy foods in the appropriate amounts, at about the same times every day, can help you: ?Control your blood glucose. ?Lower your risk of heart disease. ?Improve your blood pressure. ?Reach or maintain a healthy weight. ? ?Every person with diabetes is different, and each person has different needs for a meal plan. Your health care provider may recommend that you work with a diet and nutrition specialist (dietitian) to make a meal plan that is best for you. Your meal plan may vary depending on factors such as: ?The calories you need. ?The medicines you take. ?Your weight. ?Your blood glucose, blood pressure, and cholesterol levels. ?Your activity level. ?Other health conditions you have, such as heart or kidney disease. ? ?How do carbohydrates affect me? ?Carbohydrates affect your blood glucose level more than any other type of food. Eating carbohydrates naturally increases the amount of glucose in your blood. Carbohydrate counting is a method for keeping track of how many carbohydrates you eat. Counting carbohydrates is important to keep your blood glucose at a healthy level, especially if you use insulin or take certain oral diabetes medicines. ?It is important to know  how many carbohydrates you can safely have in each meal. This is different for every person. Your dietitian can help you calculate how many carbohydrates you should have at each meal and for snack. ?Foods that contain carbohydrates include: ?Bread, cereal, rice, pasta, and crackers. ?Potatoes and corn. ?Peas, beans, and lentils. ?Milk and yogurt. ?Fruit and juice. ?Desserts, such as cakes, cookies, ice cream, and candy. ? ?How does alcohol affect me? ?Alcohol can cause a sudden decrease in blood glucose (hypoglycemia), especially if you use insulin or take certain oral diabetes medicines. Hypoglycemia can be a life-threatening condition. Symptoms of hypoglycemia (sleepiness, dizziness, and confusion) are similar to symptoms of having too much alcohol. ?If your health care provider says that alcohol is safe for you, follow these guidelines: ?Limit alcohol intake to no more than 1 drink per day for nonpregnant women and 2 drinks per day for men. One drink equals 12 oz of beer, 5 oz of wine, or 1? oz of hard liquor. ?Do not drink on an empty stomach. ?Keep yourself hydrated with water, diet soda, or unsweetened iced tea. ?Keep in mind that regular soda, juice, and other mixers may contain a lot of sugar and must be counted as carbohydrates. ? ?What are tips for following this plan? ? ?Reading food labels ?Start by checking the serving size on the label. The amount of calories, carbohydrates, fats, and other nutrients listed on the label are based on one serving of the food. Many foods contain more than one serving per package. ?Check the total grams (g) of  carbohydrates in one serving. You can calculate the number of servings of carbohydrates in one serving by dividing the total carbohydrates by 15. For example, if a food has 30 g of total carbohydrates, it would be equal to 2 servings of carbohydrates. ?Check the number of grams (g) of saturated and trans fats in one serving. Choose foods that have low or no amount of  these fats. ?Check the number of milligrams (mg) of sodium in one serving. Most people should limit total sodium intake to less than 2,300 mg per day. ?Always check the nutrition information of foods labeled as "low-fat" or "nonfat". These foods may be higher in added sugar or refined carbohydrates and should be avoided. ?Talk to your dietitian to identify your daily goals for nutrients listed on the label. ? ?Shopping ?Avoid buying canned, premade, or processed foods. These foods tend to be high in fat, sodium, and added sugar. ?Shop around the outside edge of the grocery store. This includes fresh fruits and vegetables, bulk grains, fresh meats, and fresh dairy. ? ?Cooking ?Use low-heat cooking methods, such as baking, instead of high-heat cooking methods like deep frying. ?Cook using healthy oils, such as olive, canola, or sunflower oil. ?Avoid cooking with butter, cream, or high-fat meats. ? ?Meal planning ?Eat meals and snacks regularly, preferably at the same times every day. Avoid going long periods of time without eating. ?Eat foods high in fiber, such as fresh fruits, vegetables, beans, and whole grains. Talk to your dietitian about how many servings of carbohydrates you can eat at each meal. ?Eat 4-6 ounces of lean protein each day, such as lean meat, chicken, fish, eggs, or tofu. 1 ounce is equal to 1 ounce of meat, chicken, or fish, 1 egg, or 1/4 cup of tofu. ?Eat some foods each day that contain healthy fats, such as avocado, nuts, seeds, and fish. ? ?Lifestyle ? ?Check your blood glucose regularly. ?Exercise at least 30 minutes 5 or more days each week, or as told by your health care provider. ?Take medicines as told by your health care provider. ?Do not use any products that contain nicotine or tobacco, such as cigarettes and e-cigarettes. If you need help quitting, ask your health care provider. ?Work with a Social worker or diabetes educator to identify strategies to manage stress and any emotional and  social challenges. ? ?What are some questions to ask my health care provider? ?Do I need to meet with a diabetes educator? ?Do I need to meet with a dietitian? ?What number can I call if I have questions? ?When are the best times to check my blood glucose? ? ?Where to find more information: ?American Diabetes Association: diabetes.org/food-and-fitness/food ?Academy of Nutrition and Dietetics: PokerClues.dk ?[employer] of Diabetes and Digestive and Kidney Diseases (NIH): ContactWire.be ? ?Summary ?A healthy meal plan will help you control your blood glucose and maintain a healthy lifestyle. ?Working with a diet and nutrition specialist (dietitian) can help you make a meal plan that is best for you. ?Keep in mind that carbohydrates and alcohol have immediate effects on your blood glucose levels. It is important to count carbohydrates and to use alcohol carefully. ?This information is not intended to replace advice given to you by your health care provider. Make sure you discuss any questions you have with your health care provider. ?Document Released: 11/04/2004 Document Revised: 03/14/2016 Document Reviewed: 03/14/2016 ?Elsevier Interactive Patient Education ? 2018 [employer]. ? ? ? ?

## 2021-07-01 NOTE — Progress Notes (Signed)
?  ? ?Subjective:  ?Patient ID: Steven Osborne, male    DOB: [birth year], 81 y.o.   MRN: [id number] ? ?Patient Care Team: ?Baruch Gouty, FNP as PCP - General (Family Medicine) ?Josue Hector, MD as PCP - Cardiology (Cardiology) ?Lavera Guise, [facility] as Pharmacist (Family Medicine) ?Sharmon Revere as Physician Assistant (Cardiology)  ? ?Chief Complaint:  Medical Management of Chronic Issues ? ? ?HPI: ?Steven Osborne is a 81 y.o. male presenting on 07/01/2021 for Medical Management of Chronic Issues ? ?Patient presents today for management of chronic medical conditions.  He has a complex medical history including type 2 diabetes with associated hypertension, chronic kidney disease, hyperlipidemia, and macular edema.  He is status post CABG in 2005 and cardiac cath with stenting in 2022.  When he initially presented for to establish care he is managing his diabetes himself.  Have tried to adjust diabetic regimen over the last several months.  Patient reports he is still using his 70/30 insulin on an as needed/sliding scale basis.  Patient has been educated that this is a once daily dosing and should not use this other than how prescribed.  States he had a hypoglycemic episode where EMS had to come to his house.  He was not taken to the hospital. He denies recurrent episodes. States BS was 215 this morning so he took an additional 10 units of 70/30.  Since last visit he has been seen by cardiology, nephrology, and ophthalmology.  Those records have been reviewed.  He is currently on Farxiga and NovoLog 70/30 for diabetic management. He is on ARB therapy.  ? ? ? ?Relevant past medical, surgical, family, and social history reviewed and updated as indicated.  ?Allergies and medications reviewed and updated. Data reviewed: Chart in Epic. ? ? ?Past Medical History:  ?Diagnosis Date  ? ARTERIOSCLEROSIS 02/10/2010  ? CAD 02/25/2010  ? DIABETES TYPE 2 02/10/2010  ? Glaucoma   ? right eye  ? HYPERLIPIDEMIA NOS 02/10/2010  ?  HYPERTENSION 02/10/2010  ? INTERMITTENT CLAUDICATION 02/10/2010  ? MI, old   ? ? ?Past Surgical History:  ?Procedure Laterality Date  ? BIOPSY  12/18/2017  ? Procedure: BIOPSY;  Surgeon: Danie Binder, MD;  [facility];  Service: Endoscopy;;  ? CORONARY ARTERY BYPASS GRAFT  2005  ? 4 vessel bypass   ? CORONARY STENT INTERVENTION N/A 04/09/2020  ? Procedure: CORONARY STENT INTERVENTION;  Surgeon: Nelva Bush, MD;  Location: [place] [facility];  Service: Cardiovascular;  Laterality: N/A;  OM-2  ? ESOPHAGOGASTRODUODENOSCOPY N/A 12/18/2017  ? Procedure: ESOPHAGOGASTRODUODENOSCOPY (EGD);  Surgeon: Danie Binder, MD;  [facility];  Service: Endoscopy;  Laterality: N/A;  ? INTRAVASCULAR IMAGING/OCT N/A 04/09/2020  ? Procedure: INTRAVASCULAR IMAGING/OCT;  Surgeon: Nelva Bush, MD;  Location: [place] [facility];  Service: Cardiovascular;  Laterality: N/A;  ? LEFT HEART CATH AND CORS/GRAFTS ANGIOGRAPHY N/A 04/09/2020  ? Procedure: LEFT HEART CATH AND CORS/GRAFTS ANGIOGRAPHY;  Surgeon: Nelva Bush, MD;  Location: [place] [facility];  Service: Cardiovascular;  Laterality: N/A;  ? ? ?Social History  ? ?Socioeconomic History  ? Marital status: Divorced  ?  Spouse name: Not on file  ? Number of children: 1  ? Years of education: Not on file  ? Highest education level: Not on file  ?Occupational History  ? Not on file  ?Tobacco Use  ? Smoking status: Former  ?  Types: Cigars  ? Smokeless tobacco: Former  ?  Types:  Chew  ?  Quit date: 02/21/2014  ?Vaping Use  ? Vaping Use: Never used  ?Substance and Sexual Activity  ? Alcohol use: No  ?  Comment: as a teenager to late 20s, none since   ? Drug use: No  ? Sexual activity: Not on file  ?Other Topics Concern  ? Not on file  ?Social History Narrative  ? Divorced, lives aloneRegular exercise-yes  ? Daughter, Lynelle Smoke - lives in [place]   ? Neighbor, Otila Kluver helps him a lot - helps him clean and cook, handles medications, shopping, drives him to out of  town appointments.  ? ?Social Determinants of Health  ? ?Financial Resource Strain: Low Risk   ? Difficulty of Paying Living Expenses: Not hard at all  ?Food Insecurity: No Food Insecurity  ? Worried About Charity fundraiser in the Last Year: Never true  ? Ran Out of Food in the Last Year: Never true  ?Transportation Needs: No Transportation Needs  ? Lack of Transportation (Medical): No  ? Lack of Transportation (Non-Medical): No  ?Physical Activity: Sufficiently Active  ? Days of Exercise per Week: 7 days  ? Minutes of Exercise per Session: 30 min  ?Stress: No Stress Concern Present  ? Feeling of Stress : Not at all  ?Social Connections: Moderately Integrated  ? Frequency of Communication with Friends and Family: More than three times a week  ? Frequency of Social Gatherings with Friends and Family: More than three times a week  ? Attends Religious Services: More than 4 times per year  ? Active Member of Clubs or Organizations: Yes  ? Attends Archivist Meetings: More than 4 times per year  ? Marital Status: Widowed  ?Intimate Partner Violence: Not At Risk  ? Fear of Current or Ex-Partner: No  ? Emotionally Abused: No  ? Physically Abused: No  ? Sexually Abused: No  ? ? ?Outpatient Encounter Medications as of 07/01/2021  ?Medication Sig  ? calcitRIOL (ROCALTROL) 0.25 MCG capsule Take by mouth.  ? Cholecalciferol 25 MCG (1000 UT) capsule Take by mouth.  ? Alcohol Swabs (DROPSAFE ALCOHOL PREP) 70 % PADS Apply topically.  ? amLODipine (NORVASC) 5 MG tablet Take 1 tablet (5 mg total) by mouth daily.  ? aspirin EC 81 MG tablet Take 81 mg by mouth daily.  ? atorvastatin (LIPITOR) 20 MG tablet Take 20 mg by mouth daily.  ? blood glucose meter kit and supplies Dispense based on patient and insurance preference. Use up to four times daily as directed. (FOR ICD-10 E10.9, E11.9).  ? carvedilol (COREG) 25 MG tablet Take 25 mg by mouth 2 (two) times daily.  ? citalopram (CELEXA) 20 MG tablet Take 20 mg by mouth daily.   ? clopidogrel (PLAVIX) 75 MG tablet Take 1 tablet (75 mg total) by mouth daily.  ? dapagliflozin propanediol (FARXIGA) 10 MG TABS tablet Take 1 tablet (10 mg total) by mouth daily before breakfast.  ? dorzolamide-timolol (COSOPT) 22.3-6.8 MG/ML ophthalmic solution Place 1 drop into both eyes 2 (two) times daily.  ? ezetimibe (ZETIA) 10 MG tablet Take 1 tablet (10 mg total) by mouth daily.  ? furosemide (LASIX) 40 MG tablet Take 40 mg by mouth daily.  ? hydrALAZINE (APRESOLINE) 25 MG tablet Take 25 mg by mouth 2 (two) times daily.  ? insulin aspart protamine- aspart (NOVOLOG MIX 70/30) (70-30) 100 UNIT/ML injection Inject 0.8 mLs (80 Units total) into the skin daily with breakfast.  ? isosorbide mononitrate (IMDUR) 30 MG 24 hr  tablet Take 30 mg by mouth daily.   ? oxyCODONE (ROXICODONE) 15 MG immediate release tablet Take 15 mg by mouth 4 (four) times daily as needed.  ? pantoprazole (PROTONIX) 40 MG tablet Take 1 tablet (40 mg total) by mouth 2 (two) times daily before a meal.  ? TRUE METRIX BLOOD GLUCOSE TEST test strip SMARTSIG:[place] Meter  ? TRUEplus Lancets 33G MISC   ? vitamin C (ASCORBIC ACID) 500 MG tablet Take 500 mg by mouth daily.  ? vitamin E 180 MG (400 UNITS) capsule Take 400 Units by mouth daily.  ? [DISCONTINUED] metFORMIN (GLUCOPHAGE-XR) 500 MG 24 hr tablet Take 1 tablet (500 mg total) by mouth 2 (two) times daily with a meal. (Patient not taking: Reported on 05/26/2021)  ? ?No facility-administered encounter medications on file as of 07/01/2021.  ? ? ?Allergies  ?Allergen Reactions  ? Crestor [Rosuvastatin Calcium]   ?  myalgia  ? Ozempic (0.25 Or 0.5 Mg-Dose) [Semaglutide(0.25 Or 0.62m-Dos)]   ?  GI intolerance; not able to eat  ? ? ?Review of Systems  ?Constitutional:  Negative for activity change, appetite change, chills, fatigue and fever.  ?HENT: Negative.    ?Eyes: Negative.   ?Respiratory:  Negative for cough, chest tightness and shortness of breath.   ?Cardiovascular:  Negative for chest pain,  palpitations and leg swelling.  ?Gastrointestinal:  Negative for blood in stool, constipation, diarrhea, nausea and vomiting.  ?Endocrine: Negative.   ?Genitourinary:  Negative for dysuria, frequency and ur

## 2021-07-02 LAB — CBC WITH DIFFERENTIAL/PLATELET
Basophils Absolute: 0.1 10*3/uL (ref 0.0–0.2)
Basos: 1 %
EOS (ABSOLUTE): 0.3 10*3/uL (ref 0.0–0.4)
Eos: 5 %
Hematocrit: 43.7 % (ref 37.5–51.0)
Hemoglobin: 15 g/dL (ref 13.0–17.7)
Immature Grans (Abs): 0 10*3/uL (ref 0.0–0.1)
Immature Granulocytes: 0 %
Lymphocytes Absolute: 1.5 10*3/uL (ref 0.7–3.1)
Lymphs: 20 %
MCH: 29.6 pg (ref 26.6–33.0)
MCHC: 34.3 g/dL (ref 31.5–35.7)
MCV: 86 fL (ref 79–97)
Monocytes Absolute: 0.6 10*3/uL (ref 0.1–0.9)
Monocytes: 8 %
Neutrophils Absolute: 4.7 10*3/uL (ref 1.4–7.0)
Neutrophils: 66 %
Platelets: 134 10*3/uL — ABNORMAL LOW (ref 150–450)
RBC: 5.07 x10E6/uL (ref 4.14–5.80)
RDW: 12.4 % (ref 11.6–15.4)
WBC: 7.1 10*3/uL (ref 3.4–10.8)

## 2021-07-02 LAB — LIPID PANEL
Chol/HDL Ratio: 3.6 ratio (ref 0.0–5.0)
Cholesterol, Total: 122 mg/dL (ref 100–199)
HDL: 34 mg/dL — ABNORMAL LOW (ref >39)
LDL Chol Calc (NIH): 56 mg/dL (ref 0–99)
Triglycerides: 192 mg/dL — ABNORMAL HIGH (ref 0–149)
VLDL Cholesterol Cal: 32 mg/dL (ref 5–40)

## 2021-07-02 LAB — THYROID PANEL WITH TSH
Free Thyroxine Index: 1.8 (ref 1.2–4.9)
T3 Uptake Ratio: 30 % (ref 24–39)
T4, Total: 6.1 ug/dL (ref 4.5–12.0)
TSH: 2.67 u[IU]/mL (ref 0.450–4.500)

## 2021-07-02 LAB — CMP14+EGFR
ALT: 11 IU/L (ref 0–44)
AST: 11 IU/L (ref 0–40)
Albumin/Globulin Ratio: 1.4 (ref 1.2–2.2)
Albumin: 4.1 g/dL (ref 3.7–4.7)
Alkaline Phosphatase: 100 IU/L (ref 44–121)
BUN/Creatinine Ratio: 12 (ref 10–24)
BUN: 23 mg/dL (ref 8–27)
Bilirubin Total: 0.4 mg/dL (ref 0.0–1.2)
CO2: 26 mmol/L (ref 20–29)
Calcium: 9 mg/dL (ref 8.6–10.2)
Chloride: 102 mmol/L (ref 96–106)
Creatinine, Ser: 1.93 mg/dL — ABNORMAL HIGH (ref 0.76–1.27)
Globulin, Total: 3 g/dL (ref 1.5–4.5)
Glucose: 107 mg/dL — ABNORMAL HIGH (ref 70–99)
Potassium: 3.9 mmol/L (ref 3.5–5.2)
Sodium: 143 mmol/L (ref 134–144)
Total Protein: 7.1 g/dL (ref 6.0–8.5)
eGFR: 35 mL/min/{1.73_m2} — ABNORMAL LOW (ref >59)

## 2021-07-09 ENCOUNTER — Telehealth: Payer: Self-pay

## 2021-07-09 NOTE — Chronic Care Management (AMB) (Signed)
  Chronic Care Management Note  07/09/2021 Name: Steven Osborne MRN: [id number] DOB: 05-08-1940  [name] is a 81 y.o. year old male who is a primary care patient of Rakes, Doralee Albino, FNP and is actively engaged with the care management team. I reached out to Sheryle Hail by phone today to assist with re-scheduling a follow up visit with the Pharmacist  Follow up plan: Unsuccessful telephone outreach attempt made. A HIPAA compliant phone message was left for the patient providing contact information and requesting a return call.  The care management team will reach out to the patient again over the next 7 days.  If patient returns call to provider office, please advise to call Embedded Care Management Care Guide Penne Lash  at [phone]  Penne Lash, RMA Care Guide, Embedded Care Coordination [facility]  [place], [place] [address] Direct Dial: [phone] [email or web address]: [email or web address]

## 2021-07-15 ENCOUNTER — Encounter (HOSPITAL_COMMUNITY): Payer: Self-pay | Admitting: Emergency Medicine

## 2021-07-15 ENCOUNTER — Emergency Department (HOSPITAL_COMMUNITY)
Admission: EM | Admit: 2021-07-15 | Discharge: 2021-07-15 | Disposition: A | Discharge status: Home / Self Care | Payer: Medicare HMO | Attending: Emergency Medicine | Admitting: Emergency Medicine

## 2021-07-15 ENCOUNTER — Other Ambulatory Visit: Payer: Self-pay

## 2021-07-15 DIAGNOSIS — M542 Cervicalgia: Secondary | ICD-10-CM | POA: Insufficient documentation

## 2021-07-15 DIAGNOSIS — Z7902 Long term (current) use of antithrombotics/antiplatelets: Secondary | ICD-10-CM | POA: Insufficient documentation

## 2021-07-15 DIAGNOSIS — I2510 Atherosclerotic heart disease of native coronary artery without angina pectoris: Secondary | ICD-10-CM | POA: Diagnosis not present

## 2021-07-15 DIAGNOSIS — N189 Chronic kidney disease, unspecified: Secondary | ICD-10-CM | POA: Diagnosis not present

## 2021-07-15 DIAGNOSIS — Z79899 Other long term (current) drug therapy: Secondary | ICD-10-CM | POA: Insufficient documentation

## 2021-07-15 DIAGNOSIS — Z794 Long term (current) use of insulin: Secondary | ICD-10-CM | POA: Diagnosis not present

## 2021-07-15 DIAGNOSIS — E1122 Type 2 diabetes mellitus with diabetic chronic kidney disease: Secondary | ICD-10-CM | POA: Insufficient documentation

## 2021-07-15 DIAGNOSIS — I5020 Unspecified systolic (congestive) heart failure: Secondary | ICD-10-CM | POA: Diagnosis not present

## 2021-07-15 DIAGNOSIS — I10 Essential (primary) hypertension: Secondary | ICD-10-CM

## 2021-07-15 DIAGNOSIS — Z7982 Long term (current) use of aspirin: Secondary | ICD-10-CM | POA: Insufficient documentation

## 2021-07-15 DIAGNOSIS — I130 Hypertensive heart and chronic kidney disease with heart failure and stage 1 through stage 4 chronic kidney disease, or unspecified chronic kidney disease: Secondary | ICD-10-CM | POA: Diagnosis not present

## 2021-07-15 MED ORDER — METHOCARBAMOL 500 MG PO TABS
500.0000 mg | ORAL_TABLET | Freq: Once | ORAL | Status: AC
  Administered 2021-07-15: 500 mg via ORAL
  Filled 2021-07-15: qty 1

## 2021-07-15 MED ORDER — METHOCARBAMOL 500 MG PO TABS
500.0000 mg | ORAL_TABLET | Freq: Four times a day (QID) | ORAL | 0 refills | Status: AC | PRN
Start: 2021-07-15 — End: ?

## 2021-07-15 NOTE — Discharge Instructions (Signed)
Apply ice for 30 minutes at a time, 4 times a day.  You may take acetaminophen for pain.  Return if symptoms are getting worse.

## 2021-07-15 NOTE — ED Triage Notes (Signed)
Pt c/o severe neck pain that started last night. Pt denies any injury.

## 2021-07-15 NOTE — ED Provider Notes (Signed)
[facility] EMERGENCY DEPARTMENT Provider Note   CSN: [id number] Arrival date & time: 07/15/21  0434     History  Chief Complaint  Patient presents with   Neck Pain    Steven Osborne is a 81 y.o. male.  The history is provided by the patient.  Neck Pain He has history of hypertension, diabetes, hyperlipidemia, coronary artery disease, systolic heart failure, chronic kidney disease and comes in because of pain in the right side of his neck.  Pain came on this evening and is now subsiding.  There is no radiation of pain.  Denies any numbness or tingling or weakness.  He denies any trauma or unusual activity.  He did take some acetaminophen at home for pain which seems to be helping.  He denies similar symptoms in the past.   Home Medications Prior to Admission medications   Medication Sig Start Date End Date Taking? Authorizing Provider  Alcohol Swabs (DROPSAFE ALCOHOL PREP) 70 % PADS Apply topically. 06/10/21   [provider]  amLODipine (NORVASC) 5 MG tablet Take 1 tablet (5 mg total) by mouth daily. 05/26/21   Josue Hector, MD  aspirin EC 81 MG tablet Take 81 mg by mouth daily.    [provider]  atorvastatin (LIPITOR) 20 MG tablet Take 20 mg by mouth daily. 11/08/19   [provider]  blood glucose meter kit and supplies Dispense based on patient and insurance preference. Use up to four times daily as directed. (FOR ICD-10 E10.9, E11.9). 05/05/21   Baruch Gouty, FNP  calcitRIOL (ROCALTROL) 0.25 MCG capsule Take by mouth. 05/28/21 05/28/22  [provider]  carvedilol (COREG) 25 MG tablet Take 25 mg by mouth 2 (two) times daily. 04/02/20   [provider]  Cholecalciferol 25 MCG (1000 UT) capsule Take by mouth. 05/27/21 05/27/22  [provider]  citalopram (CELEXA) 20 MG tablet Take 20 mg by mouth daily. 02/06/20   [provider]  clopidogrel (PLAVIX) 75 MG tablet Take 1 tablet (75 mg total) by mouth daily. 05/26/21   Josue Hector, MD  dapagliflozin propanediol (FARXIGA) 10 MG TABS tablet Take 1 tablet (10 mg total) by mouth daily before breakfast. 04/02/21   Rakes, Connye Burkitt, FNP  dorzolamide-timolol (COSOPT) 22.3-6.8 MG/ML ophthalmic solution Place 1 drop into both eyes 2 (two) times daily. 01/07/21   [provider]  ezetimibe (ZETIA) 10 MG tablet Take 1 tablet (10 mg total) by mouth daily. 05/26/21   Josue Hector, MD  furosemide (LASIX) 40 MG tablet Take 40 mg by mouth daily. 02/06/20   [provider]  hydrALAZINE (APRESOLINE) 25 MG tablet Take 25 mg by mouth 2 (two) times daily. 01/07/21   [provider]  insulin aspart protamine- aspart (NOVOLOG MIX 70/30) (70-30) 100 UNIT/ML injection Inject 0.8 mLs (80 Units total) into the skin daily with breakfast. 05/05/21   Rakes, Connye Burkitt, FNP  isosorbide mononitrate (IMDUR) 30 MG 24 hr tablet Take 30 mg by mouth daily.  12/11/15   [provider]  oxyCODONE (ROXICODONE) 15 MG immediate release tablet Take 15 mg by mouth 4 (four) times daily as needed. 04/04/20   [provider]  pantoprazole (PROTONIX) 40 MG tablet Take 1 tablet (40 mg total) by mouth 2 (two) times daily before a meal. 12/19/17   Kathie Dike, MD  TRUE METRIX BLOOD GLUCOSE TEST test strip [place] 01/03/21   [provider]  TRUEplus Lancets 33G MISC  01/03/21   [provider]  vitamin C (ASCORBIC ACID) 500 MG tablet Take 500 mg by mouth daily.    [provider]  vitamin E 180 MG (400 UNITS) capsule Take 400 Units by mouth daily.    [provider]      Allergies    Crestor [rosuvastatin calcium] and Ozempic (0.25 or 0.5 mg-dose) [semaglutide(0.25 or 0.40m-dos)]    Review of Systems   Review of Systems  Musculoskeletal:  Positive for neck pain.  All other systems reviewed and are negative.  Physical Exam Updated Vital Signs BP (!) 199/81   Pulse (!) 59   Temp (!) 97.5 F (36.4 C) (Oral)   Resp 18    Ht 5' 10"  (1.778 m)   Wt 92 kg   SpO2 96%   BMI 29.10 kg/m  Physical Exam Vitals and nursing note reviewed.  81year old male, resting comfortably and in no acute distress. Vital signs are significant for elevated blood pressure. Oxygen saturation is 96%, which is normal. Head is normocephalic and atraumatic. PERRLA, EOMI. Oropharynx is clear. Neck is nontender and supple without adenopathy or JVD.  Pain is somewhat reproduced by rotation of the head to the left. Back is nontender and there is no CVA tenderness. Lungs are clear without rales, wheezes, or rhonchi. Chest is nontender. Heart has regular rate and rhythm without murmur. Abdomen is soft, flat, nontender. Extremities have no cyanosis or edema, full range of motion is present. Skin is warm and dry without rash. Neurologic: Mental status is normal, cranial nerves are intact.  Strength is 5/5 in all 4 extremities.  Sensation is normal.  ED Results / Procedures / Treatments    Procedures Procedures    Medications Ordered in ED Medications  methocarbamol (ROBAXIN) tablet 500 mg (has no administration in time range)    ED Course/ Medical Decision Making/ A&P                           Medical Decision Making  Neck pain which appears to be a mild case of torticollis.  Although his neck is nontender, when I palpate in the region of the paracervical muscles on the right he claims that that is where he was feeling his pain.  No indication for imaging.  He is discharged with prescription for methocarbamol, told to apply ice and use over-the-counter acetaminophen as needed for pain.  Final Clinical Impression(s) / ED Diagnoses Final diagnoses:  Neck pain  Elevated blood pressure reading with diagnosis of hypertension    Rx / DC Orders ED Discharge Orders          Ordered    methocarbamol (ROBAXIN) 500 MG tablet  Every 6 hours PRN        07/15/21 02197             GDelora Fuel MD [id number]

## 2021-07-22 NOTE — Chronic Care Management (AMB) (Signed)
  Chronic Care Management Note  07/22/2021 Name: Steven Osborne MRN: [id number] DOB: 07-Sep-1940  Steven Osborne is a 81 y.o. year old male who is a primary care patient of Rakes, Doralee Albino, FNP and is actively engaged with the care management team. I reached out to Sheryle Hail by phone today to assist with re-scheduling an initial visit with the Pharmacist  Follow up plan: Face to Face appointment with care management team member scheduled for: 07/27/2021  Penne Lash, RMA Care Guide, Embedded Care Coordination [facility]  [place], [place] [address] Direct Dial: [phone] [email or web address]

## 2021-08-04 ENCOUNTER — Other Ambulatory Visit: Payer: Self-pay | Admitting: Family Medicine

## 2021-08-12 ENCOUNTER — Other Ambulatory Visit: Payer: Self-pay | Admitting: Family Medicine

## 2021-08-12 MED ORDER — ISOSORBIDE MONONITRATE ER 30 MG PO TB24: 30.0000 mg | ORAL_TABLET | Freq: Every day | ORAL | 0 refills | Status: AC

## 2021-08-12 MED ORDER — FUROSEMIDE 40 MG PO TABS: 40.0000 mg | ORAL_TABLET | Freq: Every day | ORAL | 0 refills | Status: AC

## 2021-08-12 NOTE — Telephone Encounter (Signed)
  Prescription Request  08/12/2021  Is this a "Controlled Substance" medicine? No   Have you seen your PCP in the last 2 weeks? No   If YES, route message to pool  -  If NO, patient needs to be scheduled for appointment.  What is the name of the medication or equipment? Furosemide 400 mg, Isosorbide Mono ER 30 MG  Have you contacted your pharmacy to request a refill? YES   Which [facility] you [facility]   Patient notified that their request is being sent to the clinical staff for review and that they should receive a response within 2 business days.

## 2021-08-13 ENCOUNTER — Other Ambulatory Visit: Payer: Self-pay | Admitting: *Deleted

## 2021-08-13 MED ORDER — DORZOLAMIDE HCL-TIMOLOL MAL 2-0.5 % OP SOLN: 1.0000 [drp] | Freq: Two times a day (BID) | OPHTHALMIC | 0 refills | Status: AC

## 2021-08-13 MED ORDER — ATORVASTATIN CALCIUM 20 MG PO TABS: 20.0000 mg | ORAL_TABLET | Freq: Every day | ORAL | 3 refills | Status: AC

## 2021-08-13 MED ORDER — HYDRALAZINE HCL 25 MG PO TABS: 25.0000 mg | ORAL_TABLET | Freq: Two times a day (BID) | ORAL | 0 refills | Status: AC

## 2021-08-18 ENCOUNTER — Ambulatory Visit: Payer: Medicare HMO | Admitting: Family Medicine

## 2021-08-18 ENCOUNTER — Encounter: Payer: Self-pay | Admitting: Family Medicine

## 2021-08-26 ENCOUNTER — Ambulatory Visit: Payer: Medicare HMO

## 2021-09-30 ENCOUNTER — Other Ambulatory Visit: Payer: Self-pay | Admitting: Family Medicine

## 2021-10-01 ENCOUNTER — Ambulatory Visit: Payer: Medicare HMO | Admitting: Family Medicine

## 2021-12-13 ENCOUNTER — Telehealth: Payer: Self-pay

## 2021-12-13 NOTE — Telephone Encounter (Signed)
Received notification from [employer] regarding RE-ENROLLMENT approval for FARXIGA. Patient assistance approved from 02/21/22 to 02/21/23.  Phone: [phone]  

## 2021-12-30 NOTE — Telephone Encounter (Signed)
Disregard previous note - patient eligible for 2024 re-enrollment. 

## 2022-06-04 ENCOUNTER — Other Ambulatory Visit: Payer: Self-pay | Admitting: Family Medicine

## 2022-09-21 ENCOUNTER — Other Ambulatory Visit: Payer: Self-pay | Admitting: Family Medicine
